# Patient Record
Sex: Female | Born: 1980 | Race: Black or African American | Hispanic: No | Marital: Single | Smoking: Former smoker
Health system: Southern US, Community
[De-identification: ages and names within clinical notes are randomized; demographics above are authoritative.]

## PROBLEM LIST (undated history)

## (undated) ENCOUNTER — Inpatient Hospital Stay (HOSPITAL_COMMUNITY): Payer: Self-pay

## (undated) DIAGNOSIS — F419 Anxiety disorder, unspecified: Secondary | ICD-10-CM

## (undated) DIAGNOSIS — D649 Anemia, unspecified: Secondary | ICD-10-CM

## (undated) DIAGNOSIS — D509 Iron deficiency anemia, unspecified: Secondary | ICD-10-CM

## (undated) DIAGNOSIS — E559 Vitamin D deficiency, unspecified: Secondary | ICD-10-CM

## (undated) DIAGNOSIS — O2340 Unspecified infection of urinary tract in pregnancy, unspecified trimester: Secondary | ICD-10-CM

## (undated) DIAGNOSIS — E785 Hyperlipidemia, unspecified: Secondary | ICD-10-CM

## (undated) DIAGNOSIS — I10 Essential (primary) hypertension: Secondary | ICD-10-CM

## (undated) DIAGNOSIS — A599 Trichomoniasis, unspecified: Secondary | ICD-10-CM

## (undated) HISTORY — DX: Hyperlipidemia, unspecified: E78.5

## (undated) HISTORY — DX: Anxiety disorder, unspecified: F41.9

## (undated) HISTORY — PX: WISDOM TOOTH EXTRACTION: SHX21

---
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
## 2005-04-25 ENCOUNTER — Emergency Department (HOSPITAL_COMMUNITY): Admission: EM | Admit: 2005-04-25 | Discharge: 2005-04-25 | Payer: Self-pay | Admitting: Family Medicine

## 2006-05-09 ENCOUNTER — Emergency Department (HOSPITAL_COMMUNITY): Admission: EM | Admit: 2006-05-09 | Discharge: 2006-05-09 | Payer: Self-pay | Admitting: Family Medicine

## 2010-11-15 ENCOUNTER — Inpatient Hospital Stay (INDEPENDENT_AMBULATORY_CARE_PROVIDER_SITE_OTHER)
Admission: RE | Admit: 2010-11-15 | Discharge: 2010-11-15 | Disposition: A | Discharge status: Home / Self Care | Payer: Medicaid Other | Source: Ambulatory Visit | Attending: Emergency Medicine | Admitting: Emergency Medicine

## 2010-11-15 DIAGNOSIS — R51 Headache: Secondary | ICD-10-CM

## 2010-11-15 DIAGNOSIS — N390 Urinary tract infection, site not specified: Secondary | ICD-10-CM

## 2010-11-15 LAB — POCT URINALYSIS DIP (DEVICE)
Bilirubin Urine: NEGATIVE
Glucose, UA: NEGATIVE mg/dL
Hgb urine dipstick: NEGATIVE
Ketones, ur: NEGATIVE mg/dL
Nitrite: NEGATIVE
Specific Gravity, Urine: 1.025 (ref 1.005–1.030)
pH: 6 (ref 5.0–8.0)

## 2011-06-14 NOTE — L&D Delivery Note (Signed)
Delivery Note At 8:07 AM a viable female was delivered via  (Presentation: ;  ).  APGAR: , ; weight .   Placenta status: , .  Cord:  with the following complications: .  Cord pH: not done  Anesthesia: Epidural  Episiotomy:  Lacerations:  Suture Repair: 2.0 Est. Blood Loss (mL):   Mom to postpartum.  Baby to nursery-stable.  Sherman Lipuma A 01/22/2012, 8:19 AM

## 2011-07-12 ENCOUNTER — Other Ambulatory Visit: Payer: Self-pay | Admitting: Internal Medicine

## 2011-07-12 DIAGNOSIS — R946 Abnormal results of thyroid function studies: Secondary | ICD-10-CM

## 2011-07-14 ENCOUNTER — Ambulatory Visit
Admission: RE | Admit: 2011-07-14 | Discharge: 2011-07-14 | Disposition: A | Discharge status: Home / Self Care | Payer: Medicaid Other | Source: Ambulatory Visit | Attending: Internal Medicine | Admitting: Internal Medicine

## 2011-07-14 DIAGNOSIS — R946 Abnormal results of thyroid function studies: Secondary | ICD-10-CM

## 2011-09-27 ENCOUNTER — Encounter (HOSPITAL_COMMUNITY): Payer: Self-pay

## 2011-09-27 ENCOUNTER — Inpatient Hospital Stay (HOSPITAL_COMMUNITY)
Admission: AD | Admit: 2011-09-27 | Discharge: 2011-09-27 | Disposition: A | Discharge status: Home / Self Care | Payer: Medicaid Other | Source: Ambulatory Visit | Attending: Obstetrics | Admitting: Obstetrics

## 2011-09-27 ENCOUNTER — Inpatient Hospital Stay (HOSPITAL_COMMUNITY): Payer: Medicaid Other

## 2011-09-27 DIAGNOSIS — Z3490 Encounter for supervision of normal pregnancy, unspecified, unspecified trimester: Secondary | ICD-10-CM

## 2011-09-27 DIAGNOSIS — A5901 Trichomonal vulvovaginitis: Secondary | ICD-10-CM | POA: Insufficient documentation

## 2011-09-27 DIAGNOSIS — O98819 Other maternal infectious and parasitic diseases complicating pregnancy, unspecified trimester: Secondary | ICD-10-CM | POA: Insufficient documentation

## 2011-09-27 DIAGNOSIS — M545 Low back pain, unspecified: Secondary | ICD-10-CM | POA: Insufficient documentation

## 2011-09-27 DIAGNOSIS — A599 Trichomoniasis, unspecified: Secondary | ICD-10-CM

## 2011-09-27 DIAGNOSIS — Z1389 Encounter for screening for other disorder: Secondary | ICD-10-CM

## 2011-09-27 DIAGNOSIS — M79609 Pain in unspecified limb: Secondary | ICD-10-CM | POA: Insufficient documentation

## 2011-09-27 HISTORY — DX: Essential (primary) hypertension: I10

## 2011-09-27 LAB — URINALYSIS, ROUTINE W REFLEX MICROSCOPIC
Bilirubin Urine: NEGATIVE
Glucose, UA: NEGATIVE mg/dL
Hgb urine dipstick: NEGATIVE
Specific Gravity, Urine: 1.01 (ref 1.005–1.030)
Urobilinogen, UA: 0.2 mg/dL (ref 0.0–1.0)
pH: 7 (ref 5.0–8.0)

## 2011-09-27 LAB — URINE MICROSCOPIC-ADD ON

## 2011-09-27 LAB — WET PREP, GENITAL
Clue Cells Wet Prep HPF POC: NONE SEEN
Yeast Wet Prep HPF POC: NONE SEEN

## 2011-09-27 MED ORDER — METRONIDAZOLE 500 MG PO TABS: 500.0000 mg | ORAL_TABLET | Freq: Two times a day (BID) | ORAL | Status: AC

## 2011-09-27 NOTE — MAU Provider Note (Signed)
History   Pt presents today c/o low back pain and occ leg pain. She states she thinks she is about 14wks preg but is not sure. She also c/o a heavy white vag dc. She denies vag bleeding, severe back pain, fever, or any other sx at this time. She states she can feel fetal movement.  CSN: [id number]  Arrival date and time: 09/27/11 4098   First Provider Initiated Contact with Patient 09/27/11 1036      Chief Complaint  Patient presents with  . Vaginal Discharge   HPI  OB History    Grav Para Term Preterm Abortions TAB SAB Ect Mult Living   2 1 1  0 0 0 0 0 0 1      Past Medical History  Diagnosis Date  . Hypertension     Past Surgical History  Procedure Date  . No past surgeries     Family History  Problem Relation Age of Onset  . Anesthesia problems Neg Hx     History  Substance Use Topics  . Smoking status: Never Smoker   . Smokeless tobacco: Not on file  . Alcohol Use: No    Allergies: No Known Allergies  Prescriptions prior to admission  Medication Sig Dispense Refill  . Prenatal Vit-Fe Fumarate-FA (PRENATAL MULTIVITAMIN) TABS Take 1 tablet by mouth every morning.        Review of Systems  Constitutional: Negative for fever and chills.  Eyes: Negative for blurred vision and double vision.  Respiratory: Negative for cough, hemoptysis, sputum production, shortness of breath and wheezing.   Cardiovascular: Negative for chest pain and palpitations.  Gastrointestinal: Positive for abdominal pain. Negative for nausea, vomiting, diarrhea and constipation.  Genitourinary: Negative for dysuria, urgency, frequency and hematuria.  Neurological: Negative for dizziness and headaches.  Psychiatric/Behavioral: Negative for depression and suicidal ideas.   Physical Exam   Blood pressure 125/63, pulse 91, temperature 97.3 F (36.3 C), temperature source Oral, resp. rate 16, height 5\' 2"  (1.575 m), weight 146 lb 3.2 oz (66.316 kg), SpO2 100.00%.  Physical Exam    Nursing note and vitals reviewed. Constitutional: She is oriented to person, place, and time. She appears well-developed and well-nourished. No distress.  HENT:  Head: Normocephalic and atraumatic.  Eyes: EOM are normal. Pupils are equal, round, and reactive to light.  GI: Soft. She exhibits no distension and no mass. There is no tenderness. There is no rebound and no guarding.  Genitourinary: No bleeding around the vagina. Vaginal discharge found.       Copious amount of frothy, green vag dc present. Cervix is friable. Cervix Lg/closed.  Neurological: She is alert and oriented to person, place, and time.  Skin: Skin is warm and dry. She is not diaphoretic.  Psychiatric: She has a normal mood and affect. Her behavior is normal. Judgment and thought content normal.    MAU Course  Procedures  Wet prep and GC/Chlamydia cultures done.  Results for orders placed during the hospital encounter of 09/27/11 (from the past 24 hour(s))  URINALYSIS, ROUTINE W REFLEX MICROSCOPIC     Status: Abnormal   Collection Time   09/27/11 10:00 AM      Component Value Range   Color, Urine YELLOW  YELLOW    APPearance CLEAR  CLEAR    Specific Gravity, Urine 1.010  1.005 - 1.030    pH 7.0  5.0 - 8.0    Glucose, UA NEGATIVE  NEGATIVE (mg/dL)   Hgb urine dipstick NEGATIVE  NEGATIVE  Bilirubin Urine NEGATIVE  NEGATIVE    Ketones, ur NEGATIVE  NEGATIVE (mg/dL)   Protein, ur NEGATIVE  NEGATIVE (mg/dL)   Urobilinogen, UA 0.2  0.0 - 1.0 (mg/dL)   Nitrite NEGATIVE  NEGATIVE    Leukocytes, UA SMALL (*) NEGATIVE   URINE MICROSCOPIC-ADD ON     Status: Abnormal   Collection Time   09/27/11 10:00 AM      Component Value Range   Squamous Epithelial / LPF FEW (*) RARE    WBC, UA 0-2  <3 (WBC/hpf)   RBC / HPF 0-2  <3 (RBC/hpf)   Urine-Other TRICHOMONAS PRESENT    WET PREP, GENITAL     Status: Abnormal   Collection Time   09/27/11 11:00 AM      Component Value Range   Yeast Wet Prep HPF POC NONE SEEN  NONE SEEN     Trich, Wet Prep FEW (*) NONE SEEN    Clue Cells Wet Prep HPF POC NONE SEEN  NONE SEEN    WBC, Wet Prep HPF POC MODERATE (*) NONE SEEN    US shows single IUP at 21.5wks with an EDC of 02/02/12.   Assessment and Plan  Trichomoniasis: discussed with pt at length. Will tx with Flagyl. Warned of antabuse reaction. Discussed safe sex practices.  Preg: discussed need for prenatal care. Discussed diet, activity, risks, and precautions. She states she will make an appt with Dr. Gaynell Face ASAP.  Clinton Gallant. Ovadia Lopp III, DrHSc, MPAS, PA-C  09/27/2011, 10:41 AM

## 2011-09-27 NOTE — MAU Note (Signed)
Pt in c/o lower back pain since Friday and occasional pains shooting down left leg.  Reports a creamy, white discharge that started this morning.  Denies any abdominal pain or vaginal bleeding.  LMP 06/28/11.

## 2011-09-27 NOTE — Discharge Instructions (Signed)
Trichomoniasis  Trichomoniasis is an infection, caused by the Trichomonas organism, that affects both women and men. In women, the outer female genitalia and the vagina are affected. In men, the penis is mainly affected, but the prostate and other reproductive organs can also be involved. Trichomoniasis is a sexually transmitted disease (STD) and is most often passed to another person through sexual contact. The majority of people who get trichomoniasis do so from a sexual encounter and are also at risk for other STDs.  CAUSES    Sexual intercourse with an infected partner.   It can be present in swimming pools or hot tubs.  SYMPTOMS    Abnormal gray-green frothy vaginal discharge in women.   Vaginal itching and irritation in women.   Itching and irritation of the area outside the vagina in women.   Penile discharge with or without pain in males.   Inflammation of the urethra (urethritis), causing painful urination.   Bleeding after sexual intercourse.  RELATED COMPLICATIONS   Pelvic inflammatory disease.   Infection of the uterus (endometritis).   Infertility.   Tubal (ectopic) pregnancy.   It can be associated with other STDs, including gonorrhea and chlamydia, hepatitis B, and HIV.  COMPLICATIONS DURING PREGNANCY   Early (premature) delivery.   Premature rupture of the membranes (PROM).   Low birth weight.  DIAGNOSIS    Visualization of Trichomonas under the microscope from the vagina discharge.   Ph of the vagina greater than 4.5, tested with a test tape.   Trich Rapid Test.   Culture of the organism, but this is not usually needed.   It may be found on a Pap test.   Having a "strawberry cervix,"which means the cervix looks very red like a strawberry.  TREATMENT    You may be given medication to fight the infection. Inform your caregiver if you could be or are pregnant. Some medications used to treat the infection should not be taken during pregnancy.   Over-the-counter medications or  creams to decrease itching or irritation may be recommended.   Your sexual partner will need to be treated if infected.  HOME CARE INSTRUCTIONS    Take all medication prescribed by your caregiver.   Take over-the-counter medication for itching or irritation as directed by your caregiver.   Do not have sexual intercourse while you have the infection.   Do not douche or wear tampons.   Discuss your infection with your partner, as your partner may have acquired the infection from you. Or, your partner may have been the person who transmitted the infection to you.   Have your sex partner examined and treated if necessary.   Practice safe, informed, and protected sex.   See your caregiver for other STD testing.  SEEK MEDICAL CARE IF:    You still have symptoms after you finish the medication.   You have an oral temperature above 102 F (38.9 C).   You develop belly (abdominal) pain.   You have pain when you urinate.   You have bleeding after sexual intercourse.   You develop a rash.   The medication makes you sick or makes you throw up (vomit).  Document Released: 11/23/2000 Document Revised: 05/19/2011 Document Reviewed: 12/19/2008  [employer] Patient Information 2012 [employer].

## 2011-09-27 NOTE — MAU Note (Signed)
Patient states she has had no prenatal care, plans to go to Dr. Gaynell Face. Has had a white creamy vaginal discharge since last night. Denies any bleeding or pain.

## 2011-09-28 LAB — GC/CHLAMYDIA PROBE AMP, GENITAL: GC Probe Amp, Genital: NEGATIVE

## 2011-10-21 LAB — OB RESULTS CONSOLE HEPATITIS B SURFACE ANTIGEN: Hepatitis B Surface Ag: NEGATIVE

## 2011-10-21 LAB — OB RESULTS CONSOLE RUBELLA ANTIBODY, IGM: Rubella: IMMUNE

## 2011-10-21 LAB — OB RESULTS CONSOLE ANTIBODY SCREEN: Antibody Screen: NEGATIVE

## 2011-10-21 LAB — OB RESULTS CONSOLE HIV ANTIBODY (ROUTINE TESTING): HIV: NONREACTIVE

## 2011-11-16 ENCOUNTER — Inpatient Hospital Stay (HOSPITAL_COMMUNITY)
Admission: AD | Admit: 2011-11-16 | Discharge: 2011-11-16 | Disposition: A | Discharge status: Home / Self Care | Payer: Medicaid Other | Source: Ambulatory Visit | Attending: Obstetrics | Admitting: Obstetrics

## 2011-11-16 ENCOUNTER — Encounter (HOSPITAL_COMMUNITY): Payer: Self-pay | Admitting: *Deleted

## 2011-11-16 DIAGNOSIS — B9689 Other specified bacterial agents as the cause of diseases classified elsewhere: Secondary | ICD-10-CM | POA: Insufficient documentation

## 2011-11-16 DIAGNOSIS — R319 Hematuria, unspecified: Secondary | ICD-10-CM | POA: Insufficient documentation

## 2011-11-16 DIAGNOSIS — A499 Bacterial infection, unspecified: Secondary | ICD-10-CM

## 2011-11-16 DIAGNOSIS — O2340 Unspecified infection of urinary tract in pregnancy, unspecified trimester: Secondary | ICD-10-CM

## 2011-11-16 DIAGNOSIS — N390 Urinary tract infection, site not specified: Secondary | ICD-10-CM | POA: Insufficient documentation

## 2011-11-16 DIAGNOSIS — O2390 Unspecified genitourinary tract infection in pregnancy, unspecified trimester: Secondary | ICD-10-CM | POA: Insufficient documentation

## 2011-11-16 DIAGNOSIS — N72 Inflammatory disease of cervix uteri: Secondary | ICD-10-CM | POA: Insufficient documentation

## 2011-11-16 DIAGNOSIS — N760 Acute vaginitis: Secondary | ICD-10-CM | POA: Insufficient documentation

## 2011-11-16 LAB — DIFFERENTIAL
Basophils Absolute: 0 10*3/uL (ref 0.0–0.1)
Basophils Relative: 0 % (ref 0–1)
Eosinophils Absolute: 0.2 10*3/uL (ref 0.0–0.7)
Eosinophils Relative: 2 % (ref 0–5)

## 2011-11-16 LAB — URINALYSIS, ROUTINE W REFLEX MICROSCOPIC
Nitrite: NEGATIVE
Specific Gravity, Urine: 1.01 (ref 1.005–1.030)
Urobilinogen, UA: 0.2 mg/dL (ref 0.0–1.0)
pH: 7 (ref 5.0–8.0)

## 2011-11-16 LAB — CBC
MCH: 31.1 pg (ref 26.0–34.0)
MCV: 88 fL (ref 78.0–100.0)
Platelets: 272 10*3/uL (ref 150–400)
RDW: 13.6 % (ref 11.5–15.5)

## 2011-11-16 LAB — URINE MICROSCOPIC-ADD ON

## 2011-11-16 LAB — WET PREP, GENITAL: Yeast Wet Prep HPF POC: NONE SEEN

## 2011-11-16 LAB — COMPREHENSIVE METABOLIC PANEL
Alkaline Phosphatase: 65 U/L (ref 39–117)
BUN: 6 mg/dL (ref 6–23)
Creatinine, Ser: 0.56 mg/dL (ref 0.50–1.10)
GFR calc non Af Amer: >90 mL/min (ref >90)
Sodium: 135 mEq/L (ref 135–145)
Total Bilirubin: 0.2 mg/dL — ABNORMAL LOW (ref 0.3–1.2)

## 2011-11-16 MED ORDER — CEFTRIAXONE SODIUM 250 MG IJ SOLR
250.0000 mg | Freq: Once | INTRAMUSCULAR | Status: AC
  Administered 2011-11-16: 250 mg via INTRAMUSCULAR
  Filled 2011-11-16: qty 250

## 2011-11-16 MED ORDER — AZITHROMYCIN 1 G PO PACK
1.0000 g | PACK | Freq: Once | ORAL | Status: AC
  Administered 2011-11-16: 1 g via ORAL
  Filled 2011-11-16: qty 1

## 2011-11-16 MED ORDER — METRONIDAZOLE 500 MG PO TABS: 500.0000 mg | ORAL_TABLET | Freq: Two times a day (BID) | ORAL | Status: AC

## 2011-11-16 NOTE — MAU Provider Note (Signed)
History     CSN: [id number]  Arrival date & time 11/16/11  2016   None     Chief Complaint  Patient presents with  . Hematuria    HPI Andrea Stone is a 31 y.o. female @ [redacted]w[redacted]d gestation who presents to MAU for ?  blood in urine or vagina. Symptoms started 4 days ago. Trying to drink more water and rest but continues to notice the blood. Has frequency but denies dysuria. Has noticed an increase in vaginal discharge. The history was provided by the patient.  Past Medical History  Diagnosis Date  . Hypertension     Past Surgical History  Procedure Date  . No past surgeries     Family History  Problem Relation Age of Onset  . Anesthesia problems Neg Hx     History  Substance Use Topics  . Smoking status: Never Smoker   . Smokeless tobacco: Not on file  . Alcohol Use: No    OB History    Grav Para Term Preterm Abortions TAB SAB Ect Mult Living   2 1 1  0 0 0 0 0 0 1      Review of Systems  Constitutional: Negative for fever, chills, diaphoresis and fatigue.  HENT: Negative for ear pain, congestion, sore throat, facial swelling, neck pain, neck stiffness, dental problem and sinus pressure.   Eyes: Negative for photophobia, pain, discharge and visual disturbance.  Respiratory: Negative for cough, chest tightness and wheezing.   Cardiovascular: Negative for chest pain, palpitations and leg swelling.  Gastrointestinal: Positive for abdominal pain. Negative for nausea, vomiting, diarrhea, constipation and abdominal distention.  Genitourinary: Positive for frequency, hematuria, vaginal bleeding, vaginal discharge and pelvic pain. Negative for dysuria, flank pain and difficulty urinating.       Pressure with urination.  Musculoskeletal: Negative for myalgias, back pain and gait problem.  Skin: Negative for color change and rash.  Neurological: Negative for dizziness, speech difficulty, weakness, light-headedness, numbness and headaches.  Psychiatric/Behavioral: Negative for  confusion and agitation. The patient is not nervous/anxious.     Allergies  Review of patient's allergies indicates no known allergies.  Home Medications  No current outpatient prescriptions on file.  BP 101/55  Pulse 89  Temp(Src) 98.9 F (37.2 C) (Oral)  Resp 16  Ht 5\' 4"  (1.626 m)  Wt 145 lb 12.8 oz (66.134 kg)  BMI 25.03 kg/m2  Physical Exam  Nursing note and vitals reviewed. Constitutional: She is oriented to person, place, and time. She appears well-developed and well-nourished. No distress.  HENT:  Head: Normocephalic.  Eyes: EOM are normal.  Neck: Neck supple.  Cardiovascular: Normal rate.   Pulmonary/Chest: Effort normal.  Abdominal: Soft. There is tenderness in the right lower quadrant and left lower quadrant. There is no rigidity, no rebound, no guarding and no CVA tenderness.  Genitourinary:       External genitalia without lesions. Large amount of yellow frothy bloody discharge vaginal vault. Cervix friable, bleeding does not appear to be coming from the os. External os 1 cm. Internal os closed, thick, high. Uterus consistent with dates.  Musculoskeletal: Normal range of motion.  Neurological: She is alert and oriented to person, place, and time. No cranial nerve deficit.  Skin: Skin is warm and dry.  Psychiatric: She has a normal mood and affect. Her behavior is normal. Judgment and thought content normal.   Results for orders placed during the hospital encounter of 11/16/11 (from the past 24 hour(s))  URINALYSIS, ROUTINE W REFLEX MICROSCOPIC  Status: Abnormal   Collection Time   11/16/11  8:30 PM      Component Value Range   Color, Urine YELLOW  YELLOW    APPearance HAZY (*) CLEAR    Specific Gravity, Urine 1.010  1.005 - 1.030    pH 7.0  5.0 - 8.0    Glucose, UA NEGATIVE  NEGATIVE (mg/dL)   Hgb urine dipstick MODERATE (*) NEGATIVE    Bilirubin Urine NEGATIVE  NEGATIVE    Ketones, ur NEGATIVE  NEGATIVE (mg/dL)   Protein, ur NEGATIVE  NEGATIVE (mg/dL)    Urobilinogen, UA 0.2  0.0 - 1.0 (mg/dL)   Nitrite NEGATIVE  NEGATIVE    Leukocytes, UA LARGE (*) NEGATIVE   URINE MICROSCOPIC-ADD ON     Status: Abnormal   Collection Time   11/16/11  8:30 PM      Component Value Range   Squamous Epithelial / LPF FEW (*) RARE    WBC, UA 7-10  <3 (WBC/hpf)   RBC / HPF 0-2  <3 (RBC/hpf)   Urine-Other MUCOUS PRESENT    CBC     Status: Abnormal   Collection Time   11/16/11  9:35 PM      Component Value Range   WBC 11.7 (*) 4.0 - 10.5 (K/uL)   RBC 3.67 (*) 3.87 - 5.11 (MIL/uL)   Hemoglobin 11.4 (*) 12.0 - 15.0 (g/dL)   HCT 95.2 (*) 84.1 - 46.0 (%)   MCV 88.0  78.0 - 100.0 (fL)   MCH 31.1  26.0 - 34.0 (pg)   MCHC 35.3  30.0 - 36.0 (g/dL)   RDW 32.4  40.1 - 02.7 (%)   Platelets 272  150 - 400 (K/uL)  DIFFERENTIAL     Status: Abnormal   Collection Time   11/16/11  9:35 PM      Component Value Range   Neutrophils Relative 56  43 - 77 (%)   Neutro Abs 6.6  1.7 - 7.7 (K/uL)   Lymphocytes Relative 33  12 - 46 (%)   Lymphs Abs 3.9  0.7 - 4.0 (K/uL)   Monocytes Relative 9  3 - 12 (%)   Monocytes Absolute 1.1 (*) 0.1 - 1.0 (K/uL)   Eosinophils Relative 2  0 - 5 (%)   Eosinophils Absolute 0.2  0.0 - 0.7 (K/uL)   Basophils Relative 0  0 - 1 (%)   Basophils Absolute 0.0  0.0 - 0.1 (K/uL)  COMPREHENSIVE METABOLIC PANEL     Status: Abnormal   Collection Time   11/16/11  9:35 PM      Component Value Range   Sodium 135  135 - 145 (mEq/L)   Potassium 3.9  3.5 - 5.1 (mEq/L)   Chloride 101  96 - 112 (mEq/L)   CO2 27  19 - 32 (mEq/L)   Glucose, Bld 83  70 - 99 (mg/dL)   BUN 6  6 - 23 (mg/dL)   Creatinine, Ser 2.53  0.50 - 1.10 (mg/dL)   Calcium 9.1  8.4 - 66.4 (mg/dL)   Total Protein 6.5  6.0 - 8.3 (g/dL)   Albumin 2.9 (*) 3.5 - 5.2 (g/dL)   AST 17  0 - 37 (U/L)   ALT 12  0 - 35 (U/L)   Alkaline Phosphatase 65  39 - 117 (U/L)   Total Bilirubin 0.2 (*) 0.3 - 1.2 (mg/dL)   GFR calc non Af Amer >90  >90 (mL/min)   GFR calc Af Amer >90  >90 (mL/min)  WET PREP,  GENITAL     Status: Abnormal   Collection Time   11/16/11 10:08 PM      Component Value Range   Yeast Wet Prep HPF POC NONE SEEN  NONE SEEN    Trich, Wet Prep NONE SEEN  NONE SEEN    Clue Cells Wet Prep HPF POC MANY (*) NONE SEEN    WBC, Wet Prep HPF POC TOO NUMEROUS TO COUNT (*) NONE SEEN     EFM: Baseline FH 130, reactive.  Assessment: Bacterial vaginosis   UTI @ [redacted]w[redacted]d gestation   Cervicitis  Plan:  Zithromax 1 gram po   Flagyl Rx   Rocephin 250 mg IM   GC, Chlamydia, Urine cultures pending   D/C home to follow up in the office.  ED Course: discussed with Dr. Shana Chute  Procedures  MDM

## 2011-11-16 NOTE — MAU Note (Signed)
Patient presents with c/o blood in urine since Saturday, pain in left lower quadrant pt is [redacted] weeks gestation.

## 2011-11-16 NOTE — Discharge Instructions (Signed)
Bacterial Vaginosis Bacterial vaginosis (BV) is a vaginal infection where the normal balance of bacteria in the vagina is disrupted. The normal balance is then replaced by an overgrowth of certain bacteria. There are several different kinds of bacteria that can cause BV. BV is the most common vaginal infection in women of childbearing age. CAUSES   The cause of BV is not fully understood. BV develops when there is an increase or imbalance of harmful bacteria.   Some activities or behaviors can upset the normal balance of bacteria in the vagina and put women at increased risk including:   Having a new sex partner or multiple sex partners.   Douching.   Using an intrauterine device (IUD) for contraception.   It is not clear what role sexual activity plays in the development of BV. However, women that have never had sexual intercourse are rarely infected with BV.  Women do not get BV from toilet seats, bedding, swimming pools or from touching objects around them.  SYMPTOMS   Grey vaginal discharge.   A fish-like odor with discharge, especially after sexual intercourse.   Itching or burning of the vagina and vulva.   Burning or pain with urination.   Some women have no signs or symptoms at all.  DIAGNOSIS  Your caregiver must examine the vagina for signs of BV. Your caregiver will perform lab tests and look at the sample of vaginal fluid through a microscope. They will look for bacteria and abnormal cells (clue cells), a pH test higher than 4.5, and a positive amine test all associated with BV.  RISKS AND COMPLICATIONS   Pelvic inflammatory disease (PID).   Infections following gynecology surgery.   Developing HIV.   Developing herpes virus.  TREATMENT  Sometimes BV will clear up without treatment. However, all women with symptoms of BV should be treated to avoid complications, especially if gynecology surgery is planned. Female partners generally do not need to be treated. However,  BV may spread between female sex partners so treatment is helpful in preventing a recurrence of BV.   BV may be treated with antibiotics. The antibiotics come in either pill or vaginal cream forms. Either can be used with nonpregnant or pregnant women, but the recommended dosages differ. These antibiotics are not harmful to the baby.   BV can recur after treatment. If this happens, a second round of antibiotics will often be prescribed.   Treatment is important for pregnant women. If not treated, BV can cause a premature delivery, especially for a pregnant woman who had a premature birth in the past. All pregnant women who have symptoms of BV should be checked and treated.   For chronic reoccurrence of BV, treatment with a type of prescribed gel vaginally twice a week is helpful.  HOME CARE INSTRUCTIONS   Finish all medication as directed by your caregiver.   Do not have sex until treatment is completed.   Tell your sexual partner that you have a vaginal infection. They should see their caregiver and be treated if they have problems, such as a mild rash or itching.   Practice safe sex. Use condoms. Only have 1 sex partner.  PREVENTION  Basic prevention steps can help reduce the risk of upsetting the natural balance of bacteria in the vagina and developing BV:  Do not have sexual intercourse (be abstinent).   Do not douche.   Use all of the medicine prescribed for treatment of BV, even if the signs and symptoms go away.     Tell your sex partner if you have BV. That way, they can be treated, if needed, to prevent reoccurrence.  SEEK MEDICAL CARE IF:   Your symptoms are not improving after 3 days of treatment.   You have increased discharge, pain, or fever.  MAKE SURE YOU:   Understand these instructions.   Will watch your condition.   Will get help right away if you are not doing well or get worse.  FOR MORE INFORMATION  Division of STD Prevention (DSTDP), Centers for Disease  Control and Prevention: [employer] American [employer] Health Association (ASHA): www.ashastd.org  Document Released: 05/30/2005 Document Revised: 05/19/2011 Document Reviewed: 11/20/2008 [facility] Patient Information 2012 [place], [place].Cervicitis Cervicitis is a soreness and swelling (inflammation) of the cervix. Your cervix is located at the bottom of your uterus which opens up to the vagina.  CAUSES   Sexually transmitted infections (STIs).   Allergic reaction.   Medicines or birth control devices that are put in the vagina.   Injury to the cervix.   Bacterial infections.  SYMPTOMS  There may be no symptoms. If symptoms occur, they may include:  Grey, white, yellow, or bad smelling vaginal discharge.   Pain or itching of the area outside the vagina.   Painful sexual intercourse.   Lower abdominal or lower back pain, especially during intercourse.   Frequent urination.   Abnormal vaginal bleeding between periods, after sexual intercourse, or after menopause.   Pressure or a heavy feeling in the pelvis.  DIAGNOSIS  Diagnosis is made after a pelvic exam. Other tests may include:  Examination of any discharge under a microscope (wet prep).   A Pap test.  TREATMENT  Treatment will depend on the cause of cervicitis. If it is caused by an STI, both you and your partner will need to be treated. Antibiotic medicines will be given. HOME CARE INSTRUCTIONS   Do not have sexual intercourse until your caregiver says it is okay.   Do not have sexual intercourse until your partner has been treated if your cervicitis is caused by an STI.   Take your antibiotics as directed. Finish them even if you start to feel better.  SEEK IMMEDIATE MEDICAL CARE IF:   Your symptoms come back.   You have a fever.   You experience any problems that may be related to the medicine you are taking.  MAKE SURE YOU:   Understand these instructions.   Will watch your condition.   Will get  help right away if you are not doing well or get worse.  Document Released: 05/30/2005 Document Revised: 05/19/2011 Document Reviewed: 12/27/2010 [facility] Patient Information 2012 [place], [place].Cervicitis Cervicitis is a soreness and swelling (inflammation) of the cervix. Your cervix is located at the bottom of your uterus which opens up to the vagina.  CAUSES   Sexually transmitted infections (STIs).   Allergic reaction.   Medicines or birth control devices that are put in the vagina.   Injury to the cervix.   Bacterial infections.  SYMPTOMS  There may be no symptoms. If symptoms occur, they may include:  Grey, white, yellow, or bad smelling vaginal discharge.   Pain or itching of the area outside the vagina.   Painful sexual intercourse.   Lower abdominal or lower back pain, especially during intercourse.   Frequent urination.   Abnormal vaginal bleeding between periods, after sexual intercourse, or after menopause.   Pressure or a heavy feeling in the pelvis.  DIAGNOSIS  Diagnosis is made after a pelvic exam.  Other tests may include:  Examination of any discharge under a microscope (wet prep).   A Pap test.  TREATMENT  Treatment will depend on the cause of cervicitis. If it is caused by an STI, both you and your partner will need to be treated. Antibiotic medicines will be given. HOME CARE INSTRUCTIONS   Do not have sexual intercourse until your caregiver says it is okay.   Do not have sexual intercourse until your partner has been treated if your cervicitis is caused by an STI.   Take your antibiotics as directed. Finish them even if you start to feel better.  SEEK IMMEDIATE MEDICAL CARE IF:   Your symptoms come back.   You have a fever.   You experience any problems that may be related to the medicine you are taking.  MAKE SURE YOU:   Understand these instructions.   Will watch your condition.   Will get help right away if you are not doing well or  get worse.  Document Released: 05/30/2005 Document Revised: 05/19/2011 Document Reviewed: 12/27/2010 [facility] Patient Information 2012 [place], [place].

## 2011-11-17 LAB — GC/CHLAMYDIA PROBE AMP, GENITAL
Chlamydia, DNA Probe: NEGATIVE
GC Probe Amp, Genital: NEGATIVE

## 2011-11-19 LAB — URINE CULTURE: Colony Count: >=100000

## 2011-12-06 ENCOUNTER — Inpatient Hospital Stay (HOSPITAL_COMMUNITY)
Admission: AD | Admit: 2011-12-06 | Discharge: 2011-12-06 | Disposition: A | Discharge status: Home / Self Care | Payer: Medicaid Other | Source: Ambulatory Visit | Attending: Obstetrics | Admitting: Obstetrics

## 2011-12-06 ENCOUNTER — Encounter (HOSPITAL_COMMUNITY): Payer: Self-pay

## 2011-12-06 DIAGNOSIS — R109 Unspecified abdominal pain: Secondary | ICD-10-CM | POA: Insufficient documentation

## 2011-12-06 DIAGNOSIS — O2390 Unspecified genitourinary tract infection in pregnancy, unspecified trimester: Secondary | ICD-10-CM | POA: Insufficient documentation

## 2011-12-06 DIAGNOSIS — A499 Bacterial infection, unspecified: Secondary | ICD-10-CM | POA: Insufficient documentation

## 2011-12-06 DIAGNOSIS — O2340 Unspecified infection of urinary tract in pregnancy, unspecified trimester: Secondary | ICD-10-CM

## 2011-12-06 DIAGNOSIS — N390 Urinary tract infection, site not specified: Secondary | ICD-10-CM | POA: Insufficient documentation

## 2011-12-06 DIAGNOSIS — N949 Unspecified condition associated with female genital organs and menstrual cycle: Secondary | ICD-10-CM | POA: Insufficient documentation

## 2011-12-06 DIAGNOSIS — B9689 Other specified bacterial agents as the cause of diseases classified elsewhere: Secondary | ICD-10-CM | POA: Insufficient documentation

## 2011-12-06 DIAGNOSIS — N760 Acute vaginitis: Secondary | ICD-10-CM | POA: Insufficient documentation

## 2011-12-06 LAB — URINALYSIS, ROUTINE W REFLEX MICROSCOPIC
Bilirubin Urine: NEGATIVE
Hgb urine dipstick: NEGATIVE
Ketones, ur: NEGATIVE mg/dL
Nitrite: NEGATIVE
Specific Gravity, Urine: 1.01 (ref 1.005–1.030)
Urobilinogen, UA: 0.2 mg/dL (ref 0.0–1.0)
pH: 6.5 (ref 5.0–8.0)

## 2011-12-06 LAB — WET PREP, GENITAL
Trich, Wet Prep: NONE SEEN
Yeast Wet Prep HPF POC: NONE SEEN

## 2011-12-06 LAB — URINE MICROSCOPIC-ADD ON

## 2011-12-06 MED ORDER — NITROFURANTOIN MONOHYD MACRO 100 MG PO CAPS: 100.0000 mg | ORAL_CAPSULE | Freq: Two times a day (BID) | ORAL | Status: AC

## 2011-12-06 MED ORDER — METRONIDAZOLE 500 MG PO TABS: 500.0000 mg | ORAL_TABLET | Freq: Two times a day (BID) | ORAL | Status: AC

## 2011-12-06 NOTE — MAU Provider Note (Signed)
History     CSN: [id number]  Arrival date & time 12/06/11  0910   None     Chief Complaint  Patient presents with  . Vaginal discharge     HPI Andrea Stone is a 31 y.o. female @ [redacted]w[redacted]d gestation who presents to MAU for vaginal discharge and lower abdominal cramping.  Past Medical History  Diagnosis Date  . Hypertension     Past Surgical History  Procedure Date  . No past surgeries     Family History  Problem Relation Age of Onset  . Anesthesia problems Neg Hx   . Other Neg Hx     History  Substance Use Topics  . Smoking status: Never Smoker   . Smokeless tobacco: Never Used  . Alcohol Use: No    OB History    Grav Para Term Preterm Abortions TAB SAB Ect Mult Living   2 1 1  0 0 0 0 0 0 1      Review of Systems: as stated in HPI  Allergies  Review of patient's allergies indicates no known allergies.  Home Medications  No current outpatient prescriptions on file.  BP 110/56  Pulse 90  Temp 98.7 F (37.1 C) (Oral)  Resp 16  Ht 5\' 3"  (1.6 m)  Wt 150 lb (68.04 kg)  BMI 26.57 kg/m2  Physical Exam  Nursing note and vitals reviewed. Constitutional: She is oriented to person, place, and time. She appears well-developed and well-nourished.  HENT:  Head: Normocephalic.  Eyes: EOM are normal.  Neck: Neck supple.  Cardiovascular: Normal rate.   Pulmonary/Chest: Effort normal.  Abdominal: Soft. There is no tenderness.  Genitourinary:       External genitalia without lesions. Yellow, discharge vaginal vault. Cervix 1 cm, soft, high. Uterus consistent with dates.   Musculoskeletal: Normal range of motion.  Neurological: She is alert and oriented to person, place, and time. No cranial nerve deficit.  Skin: Skin is warm and dry.  Psychiatric: She has a normal mood and affect. Her behavior is normal. Judgment and thought content normal.   Results for orders placed during the hospital encounter of 12/06/11 (from the past 24 hour(s))  URINALYSIS, ROUTINE W  REFLEX MICROSCOPIC     Status: Abnormal   Collection Time   12/06/11  9:34 AM      Component Value Range   Color, Urine YELLOW  YELLOW   APPearance CLEAR  CLEAR   Specific Gravity, Urine 1.010  1.005 - 1.030   pH 6.5  5.0 - 8.0   Glucose, UA NEGATIVE  NEGATIVE mg/dL   Hgb urine dipstick NEGATIVE  NEGATIVE   Bilirubin Urine NEGATIVE  NEGATIVE   Ketones, ur NEGATIVE  NEGATIVE mg/dL   Protein, ur NEGATIVE  NEGATIVE mg/dL   Urobilinogen, UA 0.2  0.0 - 1.0 mg/dL   Nitrite NEGATIVE  NEGATIVE   Leukocytes, UA MODERATE (*) NEGATIVE  URINE MICROSCOPIC-ADD ON     Status: Abnormal   Collection Time   12/06/11  9:34 AM      Component Value Range   Squamous Epithelial / LPF FEW (*) RARE   WBC, UA 3-6  <3 WBC/hpf   Bacteria, UA RARE  RARE  WET PREP, GENITAL     Status: Abnormal   Collection Time   12/06/11 10:37 AM      Component Value Range   Yeast Wet Prep HPF POC NONE SEEN  NONE SEEN   Trich, Wet Prep NONE SEEN  NONE SEEN   Clue  Cells Wet Prep HPF POC FEW (*) NONE SEEN   WBC, Wet Prep HPF POC TOO NUMEROUS TO COUNT (*) NONE SEEN   ED Course: Care turned over to Roney Marion, CNM @ 11:43 am  Procedures  FHR 130's, +accels, reactive Toco - irregular  MDM    UTI Bacterial Vaginosis  Plan: DC to home RX Macrobid, Flagyl Urine Culture GC/CT pending Follow-up with Dr. Gaynell Face  [facility]

## 2011-12-06 NOTE — Discharge Instructions (Signed)
Bacterial Vaginosis Bacterial vaginosis (BV) is a vaginal infection where the normal balance of bacteria in the vagina is disrupted. The normal balance is then replaced by an overgrowth of certain bacteria. There are several different kinds of bacteria that can cause BV. BV is the most common vaginal infection in women of childbearing age. CAUSES   The cause of BV is not fully understood. BV develops when there is an increase or imbalance of harmful bacteria.   Some activities or behaviors can upset the normal balance of bacteria in the vagina and put women at increased risk including:   Having a new sex partner or multiple sex partners.   Douching.   Using an intrauterine device (IUD) for contraception.   It is not clear what role sexual activity plays in the development of BV. However, women that have never had sexual intercourse are rarely infected with BV.  Women do not get BV from toilet seats, bedding, swimming pools or from touching objects around them.  SYMPTOMS   Grey vaginal discharge.   A fish-like odor with discharge, especially after sexual intercourse.   Itching or burning of the vagina and vulva.   Burning or pain with urination.   Some women have no signs or symptoms at all.  DIAGNOSIS  Your caregiver must examine the vagina for signs of BV. Your caregiver will perform lab tests and look at the sample of vaginal fluid through a microscope. They will look for bacteria and abnormal cells (clue cells), a pH test higher than 4.5, and a positive amine test all associated with BV.  RISKS AND COMPLICATIONS   Pelvic inflammatory disease (PID).   Infections following gynecology surgery.   Developing HIV.   Developing herpes virus.  TREATMENT  Sometimes BV will clear up without treatment. However, all women with symptoms of BV should be treated to avoid complications, especially if gynecology surgery is planned. Female partners generally do not need to be treated. However,  BV may spread between female sex partners so treatment is helpful in preventing a recurrence of BV.   BV may be treated with antibiotics. The antibiotics come in either pill or vaginal cream forms. Either can be used with nonpregnant or pregnant women, but the recommended dosages differ. These antibiotics are not harmful to the baby.   BV can recur after treatment. If this happens, a second round of antibiotics will often be prescribed.   Treatment is important for pregnant women. If not treated, BV can cause a premature delivery, especially for a pregnant woman who had a premature birth in the past. All pregnant women who have symptoms of BV should be checked and treated.   For chronic reoccurrence of BV, treatment with a type of prescribed gel vaginally twice a week is helpful.  HOME CARE INSTRUCTIONS   Finish all medication as directed by your caregiver.   Do not have sex until treatment is completed.   Tell your sexual partner that you have a vaginal infection. They should see their caregiver and be treated if they have problems, such as a mild rash or itching.   Practice safe sex. Use condoms. Only have 1 sex partner.  PREVENTION  Basic prevention steps can help reduce the risk of upsetting the natural balance of bacteria in the vagina and developing BV:  Do not have sexual intercourse (be abstinent).   Do not douche.   Use all of the medicine prescribed for treatment of BV, even if the signs and symptoms go away.     Tell your sex partner if you have BV. That way, they can be treated, if needed, to prevent reoccurrence.  SEEK MEDICAL CARE IF:   Your symptoms are not improving after 3 days of treatment.   You have increased discharge, pain, or fever.  MAKE SURE YOU:   Understand these instructions.   Will watch your condition.   Will get help right away if you are not doing well or get worse.  FOR MORE INFORMATION  Division of STD Prevention (DSTDP), Centers for Disease  Control and Prevention: www.cdc.gov/std American Social Health Association (ASHA): www.ashastd.org  Document Released: 05/30/2005 Document Revised: 05/19/2011 Document Reviewed: 11/20/2008 Exit[employer] Patient Information 2012 [employer]. 

## 2011-12-06 NOTE — MAU Note (Signed)
Pt states had white thick creamy vaginal discharge, was told it was probably yeast, did not go to MD office. Did not use OTC meds to treat. Here today with same c/o white thick non-odorous vaginal discharge. Denies bleeding, is having lower abd pain, rates 6/10.

## 2011-12-07 LAB — URINE CULTURE
Colony Count: 5000
Culture  Setup Time: 201306252203
Special Requests: NORMAL

## 2011-12-07 LAB — GC/CHLAMYDIA PROBE AMP, GENITAL: Chlamydia, DNA Probe: NEGATIVE

## 2011-12-18 ENCOUNTER — Encounter (HOSPITAL_COMMUNITY): Payer: Self-pay | Admitting: *Deleted

## 2011-12-18 ENCOUNTER — Inpatient Hospital Stay (HOSPITAL_COMMUNITY)
Admission: AD | Admit: 2011-12-18 | Discharge: 2011-12-18 | Disposition: A | Discharge status: Hospice | Payer: Medicaid Other | Source: Ambulatory Visit | Attending: Obstetrics | Admitting: Obstetrics

## 2011-12-18 DIAGNOSIS — T63441A Toxic effect of venom of bees, accidental (unintentional), initial encounter: Secondary | ICD-10-CM

## 2011-12-18 DIAGNOSIS — O99891 Other specified diseases and conditions complicating pregnancy: Secondary | ICD-10-CM | POA: Insufficient documentation

## 2011-12-18 DIAGNOSIS — T63461A Toxic effect of venom of wasps, accidental (unintentional), initial encounter: Secondary | ICD-10-CM

## 2011-12-18 DIAGNOSIS — Z331 Pregnant state, incidental: Secondary | ICD-10-CM

## 2011-12-18 DIAGNOSIS — T6391XA Toxic effect of contact with unspecified venomous animal, accidental (unintentional), initial encounter: Secondary | ICD-10-CM | POA: Insufficient documentation

## 2011-12-18 HISTORY — DX: Unspecified infection of urinary tract in pregnancy, unspecified trimester: O23.40

## 2011-12-18 NOTE — MAU Note (Signed)
Pt was tung by a bee at 1800 both in her mid abdomen and left hip.  She spoke with call a nurse and was instructed to come in to MAU.  No swelling or redness noted on either sting site.

## 2011-12-18 NOTE — MAU Note (Signed)
Pt reports she was stung by a bee x 2 at 6pm, now she says the areas are hurting (one on lower back and one on stomach). Denies allergy to stings. Denies difficulty breathing

## 2011-12-18 NOTE — MAU Provider Note (Signed)
  History     CSN: [id number]  Arrival date and time: 12/18/11 2015   First Provider Initiated Contact with Patient 12/18/11 2105      Chief Complaint  Patient presents with  . Insect Bite   HPI  31 year old G2 P1 001 at 33 weeks and presents the MAU with complaints of increasing that happened around 5:00pm.  She does not want on her abdomen and once on her left flank. There is slightly tender and pruritic. She denies symptoms of shortness of breath, chest pain, wheezing, throat swelling, or palpitations.   OB History    Grav Para Term Preterm Abortions TAB SAB Ect Mult Living   2 1 1  0 0 0 0 0 0 1      Past Medical History  Diagnosis Date  . Hypertension   . UTI (urinary tract infection) during pregnancy     Past Surgical History  Procedure Date  . No past surgeries     Family History  Problem Relation Age of Onset  . Anesthesia problems Neg Hx   . Other Neg Hx   . Hypertension Mother   . Asthma Sister   . Kidney disease Maternal Grandmother     History  Substance Use Topics  . Smoking status: Current Some Day Smoker -- 0.2 packs/day for 13 years    Types: Cigarettes  . Smokeless tobacco: Never Used  . Alcohol Use: No    Allergies: No Known Allergies  Prescriptions prior to admission  Medication Sig Dispense Refill  . Prenatal Vit-Fe Fumarate-FA (PRENATAL MULTIVITAMIN) TABS Take 1 tablet by mouth every morning.        ROS Physical Exam   Blood pressure 132/77, pulse 91, temperature 98.9 F (37.2 C), temperature source Oral, resp. rate 20, height 5\' 4"  (1.626 m), weight 69.854 kg (154 lb), SpO2 100.00%.  Physical Exam  Constitutional: She appears well-developed and well-nourished.  Respiratory: Effort normal. No respiratory distress. She has no wheezes. She has no rales. She exhibits no tenderness.  GI: Soft. She exhibits no distension and no mass. There is no tenderness. There is no rebound and no guarding.  Skin: Skin is warm. No rash noted. No  pallor.       5 mm area of erythema and tenderness on the left flank and on the left abdomen. No raised area.  Psychiatric: She has a normal mood and affect. Her behavior is normal. Judgment and thought content normal.    MAU Course  Procedures NST: Category 1 tracing with baseline of 132 accelerations seen. Due contractions seen on the patient did not feel these.  MDM  no evidence of anaphylaxis  Assessment and Plan   #1 bee sting  Recommended ice the patient 15 minutes on at least 15 minutes off to aid in discomfort and swelling. Patient returned and has symptoms of anaphylaxis.   Adonias Demore JEHIEL 12/18/2011, 9:10 PM

## 2011-12-20 ENCOUNTER — Inpatient Hospital Stay (HOSPITAL_COMMUNITY)
Admission: AD | Admit: 2011-12-20 | Discharge: 2011-12-20 | Disposition: A | Discharge status: Home / Self Care | Payer: Medicaid Other | Source: Ambulatory Visit | Attending: Obstetrics | Admitting: Obstetrics

## 2011-12-20 ENCOUNTER — Encounter (HOSPITAL_COMMUNITY): Payer: Self-pay

## 2011-12-20 DIAGNOSIS — O4700 False labor before 37 completed weeks of gestation, unspecified trimester: Secondary | ICD-10-CM | POA: Insufficient documentation

## 2011-12-20 DIAGNOSIS — O26899 Other specified pregnancy related conditions, unspecified trimester: Secondary | ICD-10-CM

## 2011-12-20 DIAGNOSIS — O99891 Other specified diseases and conditions complicating pregnancy: Secondary | ICD-10-CM | POA: Insufficient documentation

## 2011-12-20 DIAGNOSIS — R109 Unspecified abdominal pain: Secondary | ICD-10-CM

## 2011-12-20 LAB — URINALYSIS, ROUTINE W REFLEX MICROSCOPIC
Glucose, UA: NEGATIVE mg/dL
Hgb urine dipstick: NEGATIVE
Specific Gravity, Urine: 1.01 (ref 1.005–1.030)
Urobilinogen, UA: 0.2 mg/dL (ref 0.0–1.0)

## 2011-12-20 LAB — URINE MICROSCOPIC-ADD ON

## 2011-12-20 LAB — CBC
MCHC: 34.7 g/dL (ref 30.0–36.0)
MCV: 86.5 fL (ref 78.0–100.0)
Platelets: 243 10*3/uL (ref 150–400)
RDW: 13.1 % (ref 11.5–15.5)
WBC: 9.8 10*3/uL (ref 4.0–10.5)

## 2011-12-20 LAB — OB RESULTS CONSOLE GBS: GBS: NEGATIVE

## 2011-12-20 NOTE — MAU Provider Note (Signed)
History     CSN: [id number]  Arrival date and time: 12/20/11 0865   First Provider Initiated Contact with Patient 12/20/11 1907      Chief Complaint  Patient presents with  . Abdominal Pain   HPI Andrea Stone is 31 y.o. G2P1001 [redacted]w[redacted]d weeks presenting with pain around her "belly button"  To the lower abdomen since yesterday.  Patient of Dr. Elsie Stain.  Denies nausea, vomiting, GI sxs.  Denies vaginal bleeding or discharge.  Last intercourse 2 months ago.  Last seen in the office 7/3.  Denies complications with this pregnancy.      Past Medical History  Diagnosis Date  . Hypertension   . UTI (urinary tract infection) during pregnancy     Past Surgical History  Procedure Date  . No past surgeries     Family History  Problem Relation Age of Onset  . Anesthesia problems Neg Hx   . Other Neg Hx   . Hypertension Mother   . Asthma Sister   . Kidney disease Maternal Grandmother     History  Substance Use Topics  . Smoking status: Current Some Day Smoker -- 0.2 packs/day for 13 years    Types: Cigarettes  . Smokeless tobacco: Never Used  . Alcohol Use: No    Allergies: No Known Allergies  Prescriptions prior to admission  Medication Sig Dispense Refill  . acetaminophen (TYLENOL) 325 MG tablet Take 650 mg by mouth every 6 (six) hours as needed. For pain      . metroNIDAZOLE (FLAGYL) 500 MG tablet Take 500 mg by mouth 3 (three) times daily.      . Prenatal Vit-Fe Fumarate-FA (PRENATAL MULTIVITAMIN) TABS Take 1 tablet by mouth every morning.        ROS Physical Exam   Blood pressure 122/71, pulse 86, temperature 99.3 F (37.4 C), temperature source Oral, resp. rate 16, SpO2 99.00%.  Physical Exam  Constitutional: She is oriented to person, place, and time. She appears well-developed and well-nourished. No distress.  HENT:  Head: Normocephalic.  Neck: Normal range of motion.  Cardiovascular: Normal rate.   Respiratory: Effort normal.  GI: Soft.  Neurological:  She is alert and oriented to person, place, and time.  Skin: Skin is warm and dry.  Psychiatric: She has a normal mood and affect. Her behavior is normal.   Results for orders placed during the hospital encounter of 12/20/11 (from the past 24 hour(s))  URINALYSIS, ROUTINE W REFLEX MICROSCOPIC     Status: Abnormal   Collection Time   12/20/11  6:10 PM      Component Value Range   Color, Urine YELLOW  YELLOW   APPearance CLOUDY (*) CLEAR   Specific Gravity, Urine 1.010  1.005 - 1.030   pH 7.5  5.0 - 8.0   Glucose, UA NEGATIVE  NEGATIVE mg/dL   Hgb urine dipstick NEGATIVE  NEGATIVE   Bilirubin Urine NEGATIVE  NEGATIVE   Ketones, ur NEGATIVE  NEGATIVE mg/dL   Protein, ur NEGATIVE  NEGATIVE mg/dL   Urobilinogen, UA 0.2  0.0 - 1.0 mg/dL   Nitrite NEGATIVE  NEGATIVE   Leukocytes, UA MODERATE (*) NEGATIVE  URINE MICROSCOPIC-ADD ON     Status: Abnormal   Collection Time   12/20/11  6:10 PM      Component Value Range   Squamous Epithelial / LPF MANY (*) RARE   WBC, UA 3-6  <3 WBC/hpf   Bacteria, UA FEW (*) RARE   Casts GRANULAR CAST (*) NEGATIVE  CBC     Status: Abnormal   Collection Time   12/20/11  7:25 PM      Component Value Range   WBC 9.8  4.0 - 10.5 K/uL   RBC 3.56 (*) 3.87 - 5.11 MIL/uL   Hemoglobin 10.7 (*) 12.0 - 15.0 g/dL   HCT 57.8 (*) 46.9 - 62.9 %   MCV 86.5  78.0 - 100.0 fL   MCH 30.1  26.0 - 34.0 pg   MCHC 34.7  30.0 - 36.0 g/dL   RDW 52.8  41.3 - 24.4 %   Platelets 243  150 - 400 K/uL    MAU Course  Procedures  MDM Care turned over to Lilyan Punt, NP at [facility] reviewed.   Irregular contractions - FHT baseline 140, reactive strip.  Assessment and Plan  Abdominal pain at 33 weeks - unknown cause  Plan Urine culture pending Drink at least 8 8-oz glasses of water every day. Advise using an abdominal support maternity band Keep appointment in the office on 12-28-11 Call your doctor if you have questions or concerns.  KEY,EVE M 12/20/2011, 7:19 PM

## 2011-12-20 NOTE — MAU Note (Addendum)
Patient is here with c/o umbilicus and lower abdominal pulling/cramping when she moves. She states that she feels the pain when she changes positions and coughs. She denies any vaginal bleeding or lof. Reports good fetal movement.

## 2011-12-20 NOTE — MAU Note (Signed)
Patient states she started having abdominal pain all through the night. Has been unable to sleep. Denies any bleeding or leaking. Reports good fetal movement.

## 2011-12-22 LAB — URINE CULTURE

## 2012-01-19 ENCOUNTER — Inpatient Hospital Stay (HOSPITAL_COMMUNITY)
Admission: AD | Admit: 2012-01-19 | Discharge: 2012-01-19 | Disposition: A | Discharge status: Hospice | Payer: Medicaid Other | Source: Ambulatory Visit | Attending: Obstetrics | Admitting: Obstetrics

## 2012-01-19 ENCOUNTER — Encounter (HOSPITAL_COMMUNITY): Payer: Self-pay | Admitting: *Deleted

## 2012-01-19 DIAGNOSIS — R109 Unspecified abdominal pain: Secondary | ICD-10-CM | POA: Insufficient documentation

## 2012-01-19 DIAGNOSIS — O99891 Other specified diseases and conditions complicating pregnancy: Secondary | ICD-10-CM | POA: Insufficient documentation

## 2012-01-19 NOTE — MAU Note (Signed)
Cramping in abd started today.  Pain in sides started yesterday.

## 2012-01-22 ENCOUNTER — Inpatient Hospital Stay (HOSPITAL_COMMUNITY)
Admission: AD | Admit: 2012-01-22 | Discharge: 2012-01-24 | DRG: 775 | Disposition: A | Discharge status: Home / Self Care | Payer: Medicaid Other | Source: Ambulatory Visit | Attending: Obstetrics | Admitting: Obstetrics

## 2012-01-22 ENCOUNTER — Encounter (HOSPITAL_COMMUNITY): Payer: Self-pay | Admitting: Anesthesiology

## 2012-01-22 ENCOUNTER — Inpatient Hospital Stay (HOSPITAL_COMMUNITY): Payer: Medicaid Other | Admitting: Anesthesiology

## 2012-01-22 ENCOUNTER — Encounter (HOSPITAL_COMMUNITY): Payer: Self-pay

## 2012-01-22 LAB — COMPREHENSIVE METABOLIC PANEL
ALT: 11 U/L (ref 0–35)
AST: 20 U/L (ref 0–37)
Alkaline Phosphatase: 82 U/L (ref 39–117)
CO2: 22 mEq/L (ref 19–32)
Calcium: 9.3 mg/dL (ref 8.4–10.5)
Chloride: 103 mEq/L (ref 96–112)
GFR calc Af Amer: >90 mL/min (ref >90)
GFR calc non Af Amer: >90 mL/min (ref >90)
Glucose, Bld: 91 mg/dL (ref 70–99)
Potassium: 3.7 mEq/L (ref 3.5–5.1)
Sodium: 137 mEq/L (ref 135–145)

## 2012-01-22 LAB — CBC
HCT: 36 % (ref 36.0–46.0)
MCH: 30.2 pg (ref 26.0–34.0)
MCV: 85.7 fL (ref 78.0–100.0)
Platelets: 244 10*3/uL (ref 150–400)
RDW: 13.8 % (ref 11.5–15.5)
WBC: 10.1 10*3/uL (ref 4.0–10.5)

## 2012-01-22 MED ORDER — CITRIC ACID-SODIUM CITRATE 334-500 MG/5ML PO SOLN: 30.0000 mL | ORAL | Status: DC | PRN

## 2012-01-22 MED ORDER — DIBUCAINE 1 % RE OINT: 1.0000 "application " | TOPICAL_OINTMENT | RECTAL | Status: DC | PRN

## 2012-01-22 MED ORDER — ONDANSETRON HCL 4 MG PO TABS: 4.0000 mg | ORAL_TABLET | ORAL | Status: DC | PRN

## 2012-01-22 MED ORDER — WITCH HAZEL-GLYCERIN EX PADS: 1.0000 "application " | MEDICATED_PAD | CUTANEOUS | Status: DC | PRN

## 2012-01-22 MED ORDER — DIPHENHYDRAMINE HCL 25 MG PO CAPS: 25.0000 mg | ORAL_CAPSULE | Freq: Four times a day (QID) | ORAL | Status: DC | PRN

## 2012-01-22 MED ORDER — FENTANYL 2.5 MCG/ML BUPIVACAINE 1/10 % EPIDURAL INFUSION (WH - ANES)
14.0000 mL/h | INTRAMUSCULAR | Status: DC
  Administered 2012-01-22: 14 mL/h via EPIDURAL
  Filled 2012-01-22: qty 60

## 2012-01-22 MED ORDER — OXYCODONE-ACETAMINOPHEN 5-325 MG PO TABS
1.0000 | ORAL_TABLET | ORAL | Status: DC | PRN
  Administered 2012-01-22 – 2012-01-24 (×5): 1 via ORAL
  Filled 2012-01-22 (×3): qty 1

## 2012-01-22 MED ORDER — OXYTOCIN 40 UNITS IN LACTATED RINGERS INFUSION - SIMPLE MED
62.5000 mL/h | Freq: Once | INTRAVENOUS | Status: AC
  Administered 2012-01-22: 62.5 mL/h via INTRAVENOUS

## 2012-01-22 MED ORDER — OXYTOCIN BOLUS FROM INFUSION
250.0000 mL | Freq: Once | INTRAVENOUS | Status: AC
  Administered 2012-01-22: 250 mL via INTRAVENOUS
  Filled 2012-01-22: qty 500

## 2012-01-22 MED ORDER — ZOLPIDEM TARTRATE 5 MG PO TABS: 5.0000 mg | ORAL_TABLET | Freq: Every evening | ORAL | Status: DC | PRN

## 2012-01-22 MED ORDER — EPHEDRINE 5 MG/ML INJ
10.0000 mg | INTRAVENOUS | Status: DC | PRN
  Filled 2012-01-22: qty 4

## 2012-01-22 MED ORDER — LIDOCAINE HCL (PF) 1 % IJ SOLN: 30.0000 mL | INTRAMUSCULAR | Status: DC | PRN

## 2012-01-22 MED ORDER — LACTATED RINGERS IV SOLN
INTRAVENOUS | Status: DC
  Administered 2012-01-22 (×2): via INTRAVENOUS

## 2012-01-22 MED ORDER — PNEUMOCOCCAL VAC POLYVALENT 25 MCG/0.5ML IJ INJ
0.5000 mL | INJECTION | INTRAMUSCULAR | Status: DC
  Filled 2012-01-22: qty 0.5

## 2012-01-22 MED ORDER — PRENATAL MULTIVITAMIN CH
1.0000 | ORAL_TABLET | Freq: Every day | ORAL | Status: DC
  Administered 2012-01-22 – 2012-01-24 (×3): 1 via ORAL
  Filled 2012-01-22 (×3): qty 1

## 2012-01-22 MED ORDER — IBUPROFEN 600 MG PO TABS: 600.0000 mg | ORAL_TABLET | Freq: Four times a day (QID) | ORAL | Status: DC | PRN

## 2012-01-22 MED ORDER — LANOLIN HYDROUS EX OINT: TOPICAL_OINTMENT | CUTANEOUS | Status: DC | PRN

## 2012-01-22 MED ORDER — ONDANSETRON HCL 4 MG/2ML IJ SOLN
4.0000 mg | Freq: Four times a day (QID) | INTRAMUSCULAR | Status: DC | PRN
  Administered 2012-01-22: 4 mg via INTRAVENOUS
  Filled 2012-01-22: qty 2

## 2012-01-22 MED ORDER — OXYTOCIN 40 UNITS IN LACTATED RINGERS INFUSION - SIMPLE MED
INTRAVENOUS | Status: AC
  Filled 2012-01-22: qty 1000

## 2012-01-22 MED ORDER — IBUPROFEN 600 MG PO TABS
600.0000 mg | ORAL_TABLET | Freq: Four times a day (QID) | ORAL | Status: DC
  Administered 2012-01-22 – 2012-01-24 (×9): 600 mg via ORAL
  Filled 2012-01-22 (×10): qty 1

## 2012-01-22 MED ORDER — LACTATED RINGERS IV SOLN: 500.0000 mL | INTRAVENOUS | Status: DC | PRN

## 2012-01-22 MED ORDER — SENNOSIDES-DOCUSATE SODIUM 8.6-50 MG PO TABS
2.0000 | ORAL_TABLET | Freq: Every day | ORAL | Status: DC
  Administered 2012-01-22 – 2012-01-23 (×2): 2 via ORAL

## 2012-01-22 MED ORDER — ACETAMINOPHEN 325 MG PO TABS: 650.0000 mg | ORAL_TABLET | ORAL | Status: DC | PRN

## 2012-01-22 MED ORDER — LIDOCAINE HCL (PF) 1 % IJ SOLN
INTRAMUSCULAR | Status: AC
  Filled 2012-01-22: qty 30

## 2012-01-22 MED ORDER — EPHEDRINE 5 MG/ML INJ: 10.0000 mg | INTRAVENOUS | Status: DC | PRN

## 2012-01-22 MED ORDER — DIPHENHYDRAMINE HCL 50 MG/ML IJ SOLN: 12.5000 mg | INTRAMUSCULAR | Status: DC | PRN

## 2012-01-22 MED ORDER — PHENYLEPHRINE 40 MCG/ML (10ML) SYRINGE FOR IV PUSH (FOR BLOOD PRESSURE SUPPORT)
80.0000 ug | PREFILLED_SYRINGE | INTRAVENOUS | Status: DC | PRN
  Filled 2012-01-22: qty 5

## 2012-01-22 MED ORDER — BENZOCAINE-MENTHOL 20-0.5 % EX AERO: 1.0000 "application " | INHALATION_SPRAY | CUTANEOUS | Status: DC | PRN

## 2012-01-22 MED ORDER — FERROUS SULFATE 325 (65 FE) MG PO TABS
325.0000 mg | ORAL_TABLET | Freq: Two times a day (BID) | ORAL | Status: DC
  Administered 2012-01-22 – 2012-01-24 (×4): 325 mg via ORAL
  Filled 2012-01-22 (×4): qty 1

## 2012-01-22 MED ORDER — OXYCODONE-ACETAMINOPHEN 5-325 MG PO TABS
1.0000 | ORAL_TABLET | ORAL | Status: DC | PRN
  Filled 2012-01-22 (×3): qty 1

## 2012-01-22 MED ORDER — PHENYLEPHRINE 40 MCG/ML (10ML) SYRINGE FOR IV PUSH (FOR BLOOD PRESSURE SUPPORT): 80.0000 ug | PREFILLED_SYRINGE | INTRAVENOUS | Status: DC | PRN

## 2012-01-22 MED ORDER — SIMETHICONE 80 MG PO CHEW: 80.0000 mg | CHEWABLE_TABLET | ORAL | Status: DC | PRN

## 2012-01-22 MED ORDER — ONDANSETRON HCL 4 MG/2ML IJ SOLN: 4.0000 mg | INTRAMUSCULAR | Status: DC | PRN

## 2012-01-22 MED ORDER — LACTATED RINGERS IV SOLN
500.0000 mL | Freq: Once | INTRAVENOUS | Status: AC
  Administered 2012-01-22: 500 mL via INTRAVENOUS

## 2012-01-22 MED ORDER — FLEET ENEMA 7-19 GM/118ML RE ENEM: 1.0000 | ENEMA | RECTAL | Status: DC | PRN

## 2012-01-22 MED ORDER — TETANUS-DIPHTH-ACELL PERTUSSIS 5-2.5-18.5 LF-MCG/0.5 IM SUSP
0.5000 mL | Freq: Once | INTRAMUSCULAR | Status: AC
  Administered 2012-01-24: 0.5 mL via INTRAMUSCULAR
  Filled 2012-01-22: qty 0.5

## 2012-01-22 MED ORDER — BUTORPHANOL TARTRATE 1 MG/ML IJ SOLN
1.0000 mg | INTRAMUSCULAR | Status: DC | PRN
  Filled 2012-01-22: qty 1

## 2012-01-22 MED ORDER — LIDOCAINE HCL (PF) 1 % IJ SOLN
INTRAMUSCULAR | Status: DC | PRN
  Administered 2012-01-22 (×2): 5 mL

## 2012-01-22 NOTE — MAU Note (Signed)
Patient is brought in by ems with c/o possible leaking amniotic fluids and frequent contractions. She reports good fetal movement.

## 2012-01-22 NOTE — Progress Notes (Signed)
Dr Gaynell Face notified of patient. Order to admit to l/d

## 2012-01-22 NOTE — H&P (Signed)
This is Dr. Francoise Ceo dictating the history and physical on  Andrea Stone she's a 31 year old gravida 2 para 1001 at 65 weeks her EDC is [phone] and she was admitted in labor 8 cm dilated vertex -2 station 90% I'm abdomen performed fluid clear negative GBS and she is contracting every 2 minutes Past medical history negative Past surgical history negative Social history noncontributory Family history negative System review HEENT noncontributory physical exam HEENT negative breasts negative Heart regular rhythm no murmurs no gallops Lungs clear to P&A Abdomen term Pelvic as described above Extremities negative

## 2012-01-22 NOTE — Anesthesia Procedure Notes (Signed)
Epidural Patient location during procedure: OB Start time: 01/22/2012 5:16 AM  Staffing Anesthesiologist: Brayton Caves R Performed by: anesthesiologist   Preanesthetic Checklist Completed: patient identified, site marked, surgical consent, pre-op evaluation, timeout performed, IV checked, risks and benefits discussed and monitors and equipment checked  Epidural Patient position: sitting Prep: site prepped and draped and DuraPrep Patient monitoring: continuous pulse ox and blood pressure Approach: midline Injection technique: LOR air and LOR saline  Needle:  Needle type: Tuohy  Needle gauge: 17 G Needle length: 9 cm Needle insertion depth: 5 cm cm Catheter type: closed end flexible Catheter size: 19 Gauge Catheter at skin depth: 10 cm Test dose: negative  Assessment Events: blood not aspirated, injection not painful, no injection resistance, negative IV test and no paresthesia  Additional Notes Patient identified.  Risk benefits discussed including failed block, incomplete pain control, headache, nerve damage, paralysis, blood pressure changes, nausea, vomiting, reactions to medication both toxic or allergic, and postpartum back pain.  Patient expressed understanding and wished to proceed.  All questions were answered.  Sterile technique used throughout procedure and epidural site dressed with sterile barrier dressing. No paresthesia or other complications noted.The patient did not experience any signs of intravascular injection such as tinnitus or metallic taste in mouth nor signs of intrathecal spread such as rapid motor block. Please see nursing notes for vital signs.

## 2012-01-22 NOTE — Plan of Care (Signed)
Problem: Consults Goal: Birthing Suites Patient Information Press F2 to bring up selections list  Outcome: Completed/Met Date Met:  01/22/12  Pt 37-[redacted] weeks EGA  Comments:  38.3 wks

## 2012-01-22 NOTE — Anesthesia Preprocedure Evaluation (Signed)
Anesthesia Evaluation  Patient identified by MRN, date of birth, ID band Patient awake    Reviewed: Allergy & Precautions, H&P , Patient's Chart, lab work & pertinent test results  Airway Mallampati: II TM Distance: >3 FB Neck ROM: full    Dental No notable dental hx.    Pulmonary neg pulmonary ROS,  breath sounds clear to auscultation  Pulmonary exam normal       Cardiovascular hypertension, negative cardio ROS  Rhythm:regular Rate:Normal     Neuro/Psych negative neurological ROS  negative psych ROS   GI/Hepatic negative GI ROS, Neg liver ROS,   Endo/Other  negative endocrine ROS  Renal/GU negative Renal ROS     Musculoskeletal   Abdominal   Peds  Hematology negative hematology ROS (+)   Anesthesia Other Findings   Reproductive/Obstetrics (+) Pregnancy                           Anesthesia Physical Anesthesia Plan  ASA: II  Anesthesia Plan: Epidural   Post-op Pain Management:    Induction:   Airway Management Planned:   Additional Equipment:   Intra-op Plan:   Post-operative Plan:   Informed Consent: I have reviewed the patients History and Physical, chart, labs and discussed the procedure including the risks, benefits and alternatives for the proposed anesthesia with the patient or authorized representative who has indicated his/her understanding and acceptance.     Plan Discussed with:   Anesthesia Plan Comments:         Anesthesia Quick Evaluation

## 2012-01-23 LAB — CBC
HCT: 30.9 % — ABNORMAL LOW (ref 36.0–46.0)
MCHC: 35 g/dL (ref 30.0–36.0)
MCV: 86.1 fL (ref 78.0–100.0)
RDW: 13.7 % (ref 11.5–15.5)
WBC: 13.8 10*3/uL — ABNORMAL HIGH (ref 4.0–10.5)

## 2012-01-23 NOTE — Anesthesia Postprocedure Evaluation (Signed)
  Anesthesia Post-op Note  Patient: Andrea Stone  Procedure(s) Performed: * No procedures listed *  Patient Location: Mother/Baby  Anesthesia Type: Epidural  Level of Consciousness: awake, alert  and oriented  Airway and Oxygen Therapy: Patient Spontanous Breathing  Post-op Pain: none  Post-op Assessment: Post-op Vital signs reviewed, Patient's Cardiovascular Status Stable, No headache, No backache, No residual numbness and No residual motor weakness  Post-op Vital Signs: Reviewed and stable  Complications: No apparent anesthesia complications

## 2012-01-23 NOTE — Progress Notes (Signed)
Patient ID: Andrea Stone, female   DOB: 10/15/1980, 31 y.o.   MRN: [id number] Postpartum day one Vital signs normal Fundus firm Lochia moderate Legs negative No problems

## 2012-01-23 NOTE — Progress Notes (Signed)
UR chart review completed.  

## 2012-01-24 NOTE — Discharge Summary (Signed)
Obstetric Discharge Summary Reason for Admission: onset of labor Prenatal Procedures: none Intrapartum Procedures: spontaneous vaginal delivery Postpartum Procedures: none Complications-Operative and Postpartum: none Hemoglobin  Date Value Range Status  01/23/2012 10.8* 12.0 - 15.0 g/dL Final     HCT  Date Value Range Status  01/23/2012 30.9* 36.0 - 46.0 % Final    Physical Exam:  General: alert and delirious Lochia: appropriate Uterine Fundus: firm Incision: healing well, dehiscence present and and and and and and and DVT Evaluation: No evidence of DVT seen on physical exam.and and and  Discharge Diagnoses: Term Pregnancy-delivered  Discharge Information: Date: 01/24/2012 Activity: pelvic rest Diet: routine Medications: Percocet Condition: stable Instructions: refer to practice specific booklet Discharge to: home Follow-up Information    Follow up with Reniah Cottingham A, MD. Call in 6 weeks.   Contact information:   [address] [place] [address] [phone]          Newborn Data: Live born female  Birth Weight: 6 lb 7.4 oz (2930 g) APGAR: 9, 9  Home with mother.  Adaeze Better A 01/24/2012, 7:39 AMand he

## 2012-01-24 NOTE — Progress Notes (Signed)
Patient ID: Andrea Stone, female   DOB: 28-Aug-1980, 31 y.o.   MRN: [id number] Postpartum day 2 Vital signs normal fundus firm Lochia moderate Legs negative no complaints discharge today

## 2012-06-09 ENCOUNTER — Inpatient Hospital Stay (HOSPITAL_COMMUNITY): Payer: Medicaid Other

## 2012-06-09 ENCOUNTER — Inpatient Hospital Stay (HOSPITAL_COMMUNITY)
Admission: AD | Admit: 2012-06-09 | Discharge: 2012-06-09 | Disposition: A | Discharge status: Home / Self Care | Payer: Medicaid Other | Source: Ambulatory Visit | Attending: Obstetrics | Admitting: Obstetrics

## 2012-06-09 ENCOUNTER — Encounter (HOSPITAL_COMMUNITY): Payer: Self-pay | Admitting: *Deleted

## 2012-06-09 DIAGNOSIS — A5901 Trichomonal vulvovaginitis: Secondary | ICD-10-CM

## 2012-06-09 DIAGNOSIS — O209 Hemorrhage in early pregnancy, unspecified: Secondary | ICD-10-CM | POA: Insufficient documentation

## 2012-06-09 DIAGNOSIS — O98819 Other maternal infectious and parasitic diseases complicating pregnancy, unspecified trimester: Secondary | ICD-10-CM | POA: Insufficient documentation

## 2012-06-09 LAB — CBC
HCT: 30.6 % — ABNORMAL LOW (ref 36.0–46.0)
MCH: 29.5 pg (ref 26.0–34.0)
MCV: 85.2 fL (ref 78.0–100.0)
Platelets: 416 10*3/uL — ABNORMAL HIGH (ref 150–400)
RBC: 3.59 MIL/uL — ABNORMAL LOW (ref 3.87–5.11)

## 2012-06-09 LAB — WET PREP, GENITAL: Yeast Wet Prep HPF POC: NONE SEEN

## 2012-06-09 LAB — URINALYSIS, ROUTINE W REFLEX MICROSCOPIC
Bilirubin Urine: NEGATIVE
Glucose, UA: NEGATIVE mg/dL
Hgb urine dipstick: NEGATIVE
Specific Gravity, Urine: <1.005 — ABNORMAL LOW (ref 1.005–1.030)
Urobilinogen, UA: 0.2 mg/dL (ref 0.0–1.0)

## 2012-06-09 LAB — URINE MICROSCOPIC-ADD ON

## 2012-06-09 LAB — POCT PREGNANCY, URINE: Preg Test, Ur: POSITIVE — AB

## 2012-06-09 MED ORDER — ONDANSETRON HCL 4 MG PO TABS
8.0000 mg | ORAL_TABLET | Freq: Once | ORAL | Status: AC
  Administered 2012-06-09: 8 mg via ORAL
  Filled 2012-06-09: qty 2

## 2012-06-09 MED ORDER — METRONIDAZOLE 500 MG PO TABS
2000.0000 mg | ORAL_TABLET | Freq: Once | ORAL | Status: AC
Start: 2012-06-09 — End: 2012-06-09
  Administered 2012-06-09: 2000 mg via ORAL
  Filled 2012-06-09: qty 4

## 2012-06-09 NOTE — MAU Note (Signed)
Patient is unsure of exactly when her last period was

## 2012-06-09 NOTE — MAU Provider Note (Signed)
History     CSN: [id number]  Arrival date and time: 06/09/12 1516   First Provider Initiated Contact with Patient 06/09/12 1613      Chief Complaint  Patient presents with  . Vaginal Bleeding   HPI  Andrea Stone is a 31 y.o. G3P2002 at [redacted]w[redacted]d who presents today with vaginal bleeding. She states that it started last night, and has been spotty in nature. The bleeding is mostly there when she wipes. She has also had a small amount of vaginal discharge. She is also having some mild/intermittent cramps  Past Medical History  Diagnosis Date  . Hypertension   . UTI (urinary tract infection) during pregnancy     No past surgical history on file.  Family History  Problem Relation Age of Onset  . Anesthesia problems Neg Hx   . Other Neg Hx   . Hypertension Mother   . Asthma Sister   . Kidney disease Maternal Grandmother     History  Substance Use Topics  . Smoking status: Current Some Day Smoker -- 0.2 packs/day for 13 years    Types: Cigarettes  . Smokeless tobacco: Never Used  . Alcohol Use: No    Allergies: No Known Allergies  Prescriptions prior to admission  Medication Sig Dispense Refill  . nitrofurantoin, macrocrystal-monohydrate, (MACROBID) 100 MG capsule Take 100 mg by mouth 2 (two) times daily. Started 06/07/12 x 7 days      . phenazopyridine (PYRIDIUM) 200 MG tablet Take 200 mg by mouth 2 (two) times daily as needed. pain      . Prenatal Vit-Fe Fumarate-FA (PRENATAL MULTIVITAMIN) TABS Take 1 tablet by mouth every morning.        Review of Systems  Constitutional: Negative for fever.  Gastrointestinal: Positive for abdominal pain. Negative for nausea, vomiting, diarrhea and constipation.  Genitourinary: Negative for dysuria, urgency and frequency.  Neurological: Negative for dizziness and headaches.   Physical Exam   Blood pressure 120/68, pulse 108, temperature 98 F (36.7 C), temperature source Oral, resp. rate 18, height 5\' 2"  (1.575 m), weight 61.236  kg (135 lb), last menstrual period 03/02/2012, unknown if currently breastfeeding.  Physical Exam  Nursing note and vitals reviewed. Constitutional: She is oriented to person, place, and time. She appears well-developed and well-nourished. No distress.  Cardiovascular: Normal rate.   Respiratory: Effort normal.  GI: Soft.  Genitourinary:        External: normal Vagina: copious frothy white discharge Cervix: pink, parous, smooth, no CMT Uterus: 13-14 weeks size Adnexa: NT   Neurological: She is alert and oriented to person, place, and time.  Skin: Skin is warm and dry.  Psychiatric: She has a normal mood and affect.    MAU Course  Procedures        Results for orders placed during the hospital encounter of 06/09/12 (from the past 24 hour(s))  URINALYSIS, ROUTINE W REFLEX MICROSCOPIC     Status: Abnormal   Collection Time   06/09/12  3:30 PM      Component Value Range   Color, Urine YELLOW  YELLOW   APPearance CLEAR  CLEAR   Specific Gravity, Urine <1.005 (*) 1.005 - 1.030   pH 6.0  5.0 - 8.0   Glucose, UA NEGATIVE  NEGATIVE mg/dL   Hgb urine dipstick NEGATIVE  NEGATIVE   Bilirubin Urine NEGATIVE  NEGATIVE   Ketones, ur NEGATIVE  NEGATIVE mg/dL   Protein, ur NEGATIVE  NEGATIVE mg/dL   Urobilinogen, UA 0.2  0.0 - 1.0 mg/dL  Nitrite POSITIVE (*) NEGATIVE   Leukocytes, UA LARGE (*) NEGATIVE  URINE MICROSCOPIC-ADD ON     Status: Abnormal   Collection Time   06/09/12  3:30 PM      Component Value Range   Squamous Epithelial / LPF FEW (*) RARE   WBC, UA 11-20  <3 WBC/hpf   Urine-Other TRICHOMONAS PRESENT    POCT PREGNANCY, URINE     Status: Abnormal   Collection Time   06/09/12  3:35 PM      Component Value Range   Preg Test, Ur POSITIVE (*) NEGATIVE  CBC     Status: Abnormal   Collection Time   06/09/12  4:00 PM      Component Value Range   WBC 11.3 (*) 4.0 - 10.5 K/uL   RBC 3.59 (*) 3.87 - 5.11 MIL/uL   Hemoglobin 10.6 (*) 12.0 - 15.0 g/dL   HCT 95.6 (*)  21.3 - 46.0 %   MCV 85.2  78.0 - 100.0 fL   MCH 29.5  26.0 - 34.0 pg   MCHC 34.6  30.0 - 36.0 g/dL   RDW 08.6  57.8 - 46.9 %   Platelets 416 (*) 150 - 400 K/uL  HCG, QUANTITATIVE, PREGNANCY     Status: Abnormal   Collection Time   06/09/12  4:00 PM      Component Value Range   hCG, Beta Chain, Quant, [place] [address] (*) <5 mIU/mL  WET PREP, GENITAL     Status: Abnormal   Collection Time   06/09/12  4:15 PM      Component Value Range   Yeast Wet Prep HPF POC NONE SEEN  NONE SEEN   Trich, Wet Prep FEW (*) NONE SEEN   Clue Cells Wet Prep HPF POC FEW (*) NONE SEEN   WBC, Wet Prep HPF POC MANY (*) NONE SEEN   Assessment and Plan   1. Trichomonas vaginitis   Treated here in MAU Discussed partner treatment at the health department  Second trimester danger signs reviewed Start [facility] ASAP  Tawnya Crook 06/09/2012, 4:20 PM

## 2012-06-09 NOTE — MAU Note (Signed)
Pt reports having vaginal bleeding that started last night (when she wipes) c/o lower abd and right side pain as well.

## 2012-06-13 NOTE — L&D Delivery Note (Signed)
Delivery Note At 12:26 PM a viable female was delivered via  (Presentation: ;  ).  APGAR: , ; weight .   Placenta status: , .  Cord:  with the following complications: .  Cord pH: not done  Anesthesia:   Episiotomy:  Lacerations:  Suture Repair: 2.0 Est. Blood Loss (mL):   Mom to postpartum.  Baby to nursery-stable.  Andrea Stone A 11/14/2012, 12:33 PM

## 2012-07-08 LAB — OB RESULTS CONSOLE GC/CHLAMYDIA: Chlamydia: NEGATIVE

## 2012-07-08 LAB — OB RESULTS CONSOLE HIV ANTIBODY (ROUTINE TESTING): HIV: NONREACTIVE

## 2012-07-08 LAB — OB RESULTS CONSOLE RPR: RPR: NONREACTIVE

## 2012-08-20 ENCOUNTER — Encounter (HOSPITAL_COMMUNITY): Payer: Self-pay | Admitting: Obstetrics and Gynecology

## 2012-08-20 ENCOUNTER — Inpatient Hospital Stay (HOSPITAL_COMMUNITY)
Admission: AD | Admit: 2012-08-20 | Discharge: 2012-08-21 | Disposition: A | Discharge status: Home / Self Care | Payer: Medicaid Other | Source: Ambulatory Visit | Attending: Obstetrics | Admitting: Obstetrics

## 2012-08-20 DIAGNOSIS — S39012A Strain of muscle, fascia and tendon of lower back, initial encounter: Secondary | ICD-10-CM

## 2012-08-20 DIAGNOSIS — M545 Low back pain, unspecified: Secondary | ICD-10-CM | POA: Insufficient documentation

## 2012-08-20 DIAGNOSIS — N949 Unspecified condition associated with female genital organs and menstrual cycle: Secondary | ICD-10-CM

## 2012-08-20 DIAGNOSIS — O99891 Other specified diseases and conditions complicating pregnancy: Secondary | ICD-10-CM | POA: Insufficient documentation

## 2012-08-20 LAB — URINALYSIS, ROUTINE W REFLEX MICROSCOPIC
Bilirubin Urine: NEGATIVE
Ketones, ur: NEGATIVE mg/dL
Nitrite: NEGATIVE
Protein, ur: NEGATIVE mg/dL
Urobilinogen, UA: 0.2 mg/dL (ref 0.0–1.0)

## 2012-08-20 MED ORDER — CYCLOBENZAPRINE HCL 10 MG PO TABS
10.0000 mg | ORAL_TABLET | Freq: Once | ORAL | Status: AC
  Administered 2012-08-20: 10 mg via ORAL
  Filled 2012-08-20: qty 1

## 2012-08-20 NOTE — MAU Provider Note (Signed)
Chief Complaint  Patient presents with  . Back Pain    SUBJECTIVE:  Andrea Stone is a 32 y.o. G3P2002 at [redacted]w[redacted]d  presenting with 3 day hx of sharp catching pains in low mid back whenever she flexes her hip or tries to get up from lying. Worse with walking. Spends most of the day in a recliner.No known injury to back. No dysuria, urgency or frequency. She denies contractions, vaginal bleeding or leakage of fluid. Fetus is active.   ROS: Negative except as noted above.  OBJECTIVE:  Filed Vitals:   08/20/12 2252  BP: 112/66  Pulse: 70  Temp: 98.2 F (36.8 C)  Resp: 18    Gen: NAD. Looks uncomfortable when moving Abd: soft, minimally TTP right groin Back: paraspinous L-S TTP. NT over buttocks. Neg CVAT Cx: L/C/H FHR: 145-155 UCs: none  Results for orders placed during the hospital encounter of 08/20/12 (from the past 24 hour(s))  URINALYSIS, ROUTINE W REFLEX MICROSCOPIC     Status: Abnormal   Collection Time    08/20/12 10:23 PM      Result Value Range   Color, Urine YELLOW  YELLOW   APPearance CLEAR  CLEAR   Specific Gravity, Urine 1.010  1.005 - 1.030   pH 5.5  5.0 - 8.0   Glucose, UA NEGATIVE  NEGATIVE mg/dL   Hgb urine dipstick NEGATIVE  NEGATIVE   Bilirubin Urine NEGATIVE  NEGATIVE   Ketones, ur NEGATIVE  NEGATIVE mg/dL   Protein, ur NEGATIVE  NEGATIVE mg/dL   Urobilinogen, UA 0.2  0.0 - 1.0 mg/dL   Nitrite NEGATIVE  NEGATIVE   Leukocytes, UA MODERATE (*) NEGATIVE  URINE MICROSCOPIC-ADD ON     Status: Abnormal   Collection Time    08/20/12 10:23 PM      Result Value Range   Squamous Epithelial / LPF FEW (*) RARE   WBC, UA 7-10  <3 WBC/hpf   Bacteria, UA FEW (*) RARE   MAU Course: Flexeril 10 mg po with some relief Urine culture sent  ASSESSMENT: [id number] @ [redacted]w[redacted]d Round Ligament Pain Musculoskeletal low back pain  PLAN: D/W Dr. Tamela Oddi Reassurance given and general relief measures reviewed: avoidance of precipitating movements, instructions on  abdominal tightening/pelvic rock exercises, abdominal binder, rest with hip flexion. AVS on RLP and back strain Follow-up Information   Follow up with Kathreen Cosier, MD On 08/21/2012.   Contact information:   [address] [place] [address] [phone]

## 2012-08-20 NOTE — MAU Note (Signed)
Patient states she is having sharp pains in her back, top of the the abdomen and right side.

## 2012-08-21 ENCOUNTER — Encounter (HOSPITAL_COMMUNITY): Payer: Self-pay | Admitting: *Deleted

## 2012-08-21 ENCOUNTER — Inpatient Hospital Stay (HOSPITAL_COMMUNITY)
Admission: AD | Admit: 2012-08-21 | Discharge: 2012-08-22 | Disposition: A | Discharge status: Home / Self Care | Payer: Medicaid Other | Source: Ambulatory Visit | Attending: Obstetrics | Admitting: Obstetrics

## 2012-08-21 DIAGNOSIS — A084 Viral intestinal infection, unspecified: Secondary | ICD-10-CM

## 2012-08-21 DIAGNOSIS — A088 Other specified intestinal infections: Secondary | ICD-10-CM | POA: Insufficient documentation

## 2012-08-21 DIAGNOSIS — O99891 Other specified diseases and conditions complicating pregnancy: Secondary | ICD-10-CM | POA: Insufficient documentation

## 2012-08-21 DIAGNOSIS — O212 Late vomiting of pregnancy: Secondary | ICD-10-CM | POA: Insufficient documentation

## 2012-08-21 LAB — URINALYSIS, ROUTINE W REFLEX MICROSCOPIC
Glucose, UA: NEGATIVE mg/dL
Ketones, ur: 15 mg/dL — AB
Nitrite: NEGATIVE
Protein, ur: 100 mg/dL — AB

## 2012-08-21 LAB — URINE MICROSCOPIC-ADD ON

## 2012-08-21 MED ORDER — LACTATED RINGERS IV BOLUS (SEPSIS)
1000.0000 mL | Freq: Once | INTRAVENOUS | Status: AC
  Administered 2012-08-21: 1000 mL via INTRAVENOUS

## 2012-08-21 MED ORDER — ONDANSETRON HCL 4 MG/2ML IJ SOLN
4.0000 mg | Freq: Once | INTRAMUSCULAR | Status: AC
  Administered 2012-08-21: 4 mg via INTRAVENOUS
  Filled 2012-08-21: qty 2

## 2012-08-21 NOTE — MAU Note (Signed)
Pt states she has been vomiting since 10am this morning-cannot keep anyting down she states

## 2012-08-21 NOTE — MAU Note (Signed)
Pt states she has been having nausea and vomiting since this morning about0830 and diarrhea about the same time

## 2012-08-21 NOTE — MAU Provider Note (Signed)
History     CSN: [id number]  Arrival date and time: 08/21/12 2158   First Andrea Stone Initiated Contact with Patient 08/21/12 2304      Chief Complaint  Patient presents with  . Emesis   HPI  [name] is a 32 y.o. G3P2002 at [redacted]w[redacted]d who presents to MAU today with complaint of N/V. The patient was seen in the office today and told by Dr. Gaynell Face that this was most likely viral and would resolve on its own. The patient states that the N/V started today. She has had one episode of loose stool. She denies fever, vaginal bleeding, abnormal discharge, contractions or abdominal pain. She reports good fetal movement.   OB History   Grav Para Term Preterm Abortions TAB SAB Ect Mult Living   3 2 2  0 0 0 0 0 0 2      Past Medical History  Diagnosis Date  . Hypertension   . UTI (urinary tract infection) during pregnancy     History reviewed. No pertinent past surgical history.  Family History  Problem Relation Age of Onset  . Anesthesia problems Neg Hx   . Other Neg Hx   . Hypertension Mother   . Asthma Sister   . Kidney disease Maternal Grandmother     History  Substance Use Topics  . Smoking status: Current Some Day Smoker -- 0.25 packs/day for 13 years    Types: Cigarettes  . Smokeless tobacco: Never Used  . Alcohol Use: No    Allergies: No Known Allergies  Prescriptions prior to admission  Medication Sig Dispense Refill  . acetaminophen-codeine (TYLENOL #3) 300-30 MG per tablet Take 1 tablet by mouth every 4 (four) hours as needed for pain.      . nitrofurantoin, macrocrystal-monohydrate, (MACROBID) 100 MG capsule Take 100 mg by mouth 2 (two) times daily. Started 06/07/12 x 7 days      . phenazopyridine (PYRIDIUM) 200 MG tablet Take 200 mg by mouth 2 (two) times daily as needed. pain      . Prenatal Vit-Fe Fumarate-FA (PRENATAL MULTIVITAMIN) TABS Take 1 tablet by mouth every morning.        Review of Systems  Constitutional: Negative for fever and  malaise/fatigue.  Gastrointestinal: Positive for nausea, vomiting and diarrhea. Negative for abdominal pain and constipation.  Genitourinary: Negative for dysuria, urgency and frequency.       Neg - vaginal bleeding Neg - abnormal discharge   Musculoskeletal: Positive for back pain.  Neurological: Negative for dizziness and headaches.   Physical Exam   Blood pressure 106/52, pulse 117, temperature 98.6 F (37 C), resp. rate 20, height 5\' 4"  (1.626 m), weight 135 lb 4 oz (61.349 kg), last menstrual period 03/02/2012, SpO2 100.00%.  Physical Exam  Constitutional: She is oriented to person, place, and time. She appears well-developed and well-nourished. No distress.  HENT:  Head: Normocephalic and atraumatic.  Cardiovascular: Normal rate, regular rhythm and normal heart sounds.   Respiratory: Effort normal and breath sounds normal. No respiratory distress.  GI: Soft. Bowel sounds are normal. She exhibits no distension and no mass. There is tenderness (diffuse mild abdominal discomfort with palpation). There is no rebound and no guarding.  Neurological: She is alert and oriented to person, place, and time.  Skin: Skin is warm and dry. No erythema.  Psychiatric: She has a normal mood and affect.   Results for orders placed during the hospital encounter of 08/21/12 (from the past 24 hour(s))  URINALYSIS, ROUTINE W REFLEX  MICROSCOPIC     Status: Abnormal   Collection Time    08/21/12 10:25 PM      Result Value Range   Color, Urine YELLOW  YELLOW   APPearance CLEAR  CLEAR   Specific Gravity, Urine >1.030 (*) 1.005 - 1.030   pH 6.0  5.0 - 8.0   Glucose, UA NEGATIVE  NEGATIVE mg/dL   Hgb urine dipstick NEGATIVE  NEGATIVE   Bilirubin Urine NEGATIVE  NEGATIVE   Ketones, ur 15 (*) NEGATIVE mg/dL   Protein, ur 119 (*) NEGATIVE mg/dL   Urobilinogen, UA 0.2  0.0 - 1.0 mg/dL   Nitrite NEGATIVE  NEGATIVE   Leukocytes, UA TRACE (*) NEGATIVE  URINE MICROSCOPIC-ADD ON     Status: Abnormal    Collection Time    08/21/12 10:25 PM      Result Value Range   Squamous Epithelial / LPF MANY (*) RARE   WBC, UA 3-6  <3 WBC/hpf   RBC / HPF 3-6  <3 RBC/hpf   Bacteria, UA FEW (*) RARE    MAU Course  Procedures None  MDM 1 L LR with Zofran given. Patient reports significant improvement in her symptoms. She is able to take in PO here prior to discharge.   Assessment and Plan  A: Viral gastroenteritis  P: Discharge home Follow-up with Dr. Gaynell Face 09/04/12 as scheduled Rx zofran and phenergan sent to patient's pharmacy Encouraged increased PO hydration as tolerated. BRAT diet information on AVS Return to MAU as needed or if symptoms should change or worsen  Freddi Starr, PA-C  08/21/2012, 11:04 PM

## 2012-08-22 LAB — URINE CULTURE: Colony Count: NO GROWTH

## 2012-08-22 MED ORDER — ONDANSETRON HCL 4 MG PO TABS: 4.0000 mg | ORAL_TABLET | Freq: Four times a day (QID) | ORAL | Status: DC

## 2012-08-22 MED ORDER — PROMETHAZINE HCL 25 MG PO TABS: 25.0000 mg | ORAL_TABLET | Freq: Four times a day (QID) | ORAL | Status: DC | PRN

## 2012-09-12 ENCOUNTER — Inpatient Hospital Stay (HOSPITAL_COMMUNITY)
Admission: AD | Admit: 2012-09-12 | Discharge: 2012-09-12 | Disposition: A | Discharge status: Home / Self Care | Payer: Medicaid Other | Source: Ambulatory Visit | Attending: Obstetrics | Admitting: Obstetrics

## 2012-09-12 ENCOUNTER — Encounter (HOSPITAL_COMMUNITY): Payer: Self-pay

## 2012-09-12 DIAGNOSIS — R109 Unspecified abdominal pain: Secondary | ICD-10-CM | POA: Insufficient documentation

## 2012-09-12 DIAGNOSIS — O209 Hemorrhage in early pregnancy, unspecified: Secondary | ICD-10-CM | POA: Insufficient documentation

## 2012-09-12 DIAGNOSIS — O99891 Other specified diseases and conditions complicating pregnancy: Secondary | ICD-10-CM | POA: Insufficient documentation

## 2012-09-12 DIAGNOSIS — O4700 False labor before 37 completed weeks of gestation, unspecified trimester: Secondary | ICD-10-CM | POA: Insufficient documentation

## 2012-09-12 DIAGNOSIS — M549 Dorsalgia, unspecified: Secondary | ICD-10-CM | POA: Insufficient documentation

## 2012-09-12 DIAGNOSIS — O479 False labor, unspecified: Secondary | ICD-10-CM

## 2012-09-12 DIAGNOSIS — N859 Noninflammatory disorder of uterus, unspecified: Secondary | ICD-10-CM

## 2012-09-12 LAB — FETAL FIBRONECTIN: Fetal Fibronectin: NEGATIVE

## 2012-09-12 LAB — URINALYSIS, ROUTINE W REFLEX MICROSCOPIC
Bilirubin Urine: NEGATIVE
Glucose, UA: NEGATIVE mg/dL
Ketones, ur: NEGATIVE mg/dL
Protein, ur: NEGATIVE mg/dL

## 2012-09-12 MED ORDER — TERBUTALINE SULFATE 1 MG/ML IJ SOLN
0.2500 mg | Freq: Once | INTRAMUSCULAR | Status: AC
  Administered 2012-09-12: 0.25 mg via SUBCUTANEOUS
  Filled 2012-09-12: qty 1

## 2012-09-12 MED ORDER — TERBUTALINE SULFATE 1 MG/ML IJ SOLN
0.2500 mg | Freq: Once | INTRAMUSCULAR | Status: DC
  Filled 2012-09-12: qty 1

## 2012-09-12 NOTE — MAU Note (Signed)
Patient is in with c/o vaginal bleeding since last night. She does not have a pad on. She states that she notices the dark blood when she wipes after voiding. She also states that she have irregular intermittent contractions. She denies lof. She reports good fetal movement.

## 2012-09-12 NOTE — MAU Provider Note (Signed)
History     CSN: [id number]  Arrival date and time: 09/12/12 1209   First Provider Initiated Contact with Patient 09/12/12 1309      Chief Complaint  Patient presents with  . Vaginal Bleeding  . Abdominal Cramping   HPI This is a 32 y.o. female at [redacted]w[redacted]d who presents with c/o contractions, back pain for 2-3 days. Had some bleeding after a BM last night at 0100.  No bleeding since, did not wear a pad. Good fetal movement. No leaking of fluid.  RN Note:  Patient is in with c/o vaginal bleeding since last night. She does not have a pad on. She states that she notices the dark blood when she wipes after voiding. She also states that she have irregular intermittent contractions. She denies lof. She reports good fetal movement.         OB History   Grav Para Term Preterm Abortions TAB SAB Ect Mult Living   3 2 2  0 0 0 0 0 0 2      Past Medical History  Diagnosis Date  . Hypertension   . UTI (urinary tract infection) during pregnancy     History reviewed. No pertinent past surgical history.  Family History  Problem Relation Age of Onset  . Anesthesia problems Neg Hx   . Other Neg Hx   . Hypertension Mother   . Asthma Sister   . Kidney disease Maternal Grandmother     History  Substance Use Topics  . Smoking status: Current Some Day Smoker -- 0.25 packs/day for 13 years    Types: Cigarettes  . Smokeless tobacco: Never Used  . Alcohol Use: No    Allergies: No Known Allergies  Prescriptions prior to admission  Medication Sig Dispense Refill  . acetaminophen-codeine (TYLENOL #3) 300-30 MG per tablet Take 1 tablet by mouth every 4 (four) hours as needed for pain.      . Prenatal Vit-Fe Fumarate-FA (PRENATAL MULTIVITAMIN) TABS Take 1 tablet by mouth every morning.        Review of Systems  Constitutional: Negative for fever, chills and malaise/fatigue.  Gastrointestinal: Positive for abdominal pain. Negative for nausea, vomiting, diarrhea and constipation.   Genitourinary: Negative for dysuria.  Musculoskeletal: Positive for back pain.  Neurological: Negative for headaches.   Physical Exam   Blood pressure 108/64, pulse 89, temperature 97.9 F (36.6 C), temperature source Oral, resp. rate 16, height 5\' 4"  (1.626 m), weight 139 lb 8 oz (63.277 kg), last menstrual period 03/02/2012.  Physical Exam  Constitutional: She is oriented to person, place, and time. She appears well-developed and well-nourished. No distress.  HENT:  Head: Normocephalic.  Cardiovascular: Normal rate.   Respiratory: Effort normal.  GI: Soft. She exhibits no distension and no mass. There is no tenderness. There is no rebound and no guarding.  Genitourinary: Uterus normal. Vaginal discharge (thin white) found.  Dilation: Closed Effacement (%): Thick Cervical Position: Posterior Exam by:: marie williams cnm  FFn collected  Musculoskeletal: Normal range of motion.  Neurological: She is alert and oriented to person, place, and time.  Skin: Skin is warm and dry.  Psychiatric: She has a normal mood and affect.   Results for orders placed during the hospital encounter of 09/12/12 (from the past 24 hour(s))  URINALYSIS, ROUTINE W REFLEX MICROSCOPIC     Status: Abnormal   Collection Time    09/12/12 12:00 PM      Result Value Range   Color, Urine YELLOW  YELLOW  APPearance CLEAR  CLEAR   Specific Gravity, Urine <1.005 (*) 1.005 - 1.030   pH 6.5  5.0 - 8.0   Glucose, UA NEGATIVE  NEGATIVE mg/dL   Hgb urine dipstick NEGATIVE  NEGATIVE   Bilirubin Urine NEGATIVE  NEGATIVE   Ketones, ur NEGATIVE  NEGATIVE mg/dL   Protein, ur NEGATIVE  NEGATIVE mg/dL   Urobilinogen, UA 0.2  0.0 - 1.0 mg/dL   Nitrite NEGATIVE  NEGATIVE   Leukocytes, UA MODERATE (*) NEGATIVE  URINE MICROSCOPIC-ADD ON     Status: None   Collection Time    09/12/12 12:00 PM      Result Value Range   Squamous Epithelial / LPF RARE  RARE   WBC, UA 3-6  <3 WBC/hpf   Bacteria, UA RARE  RARE  FETAL  FIBRONECTIN     Status: None   Collection Time    09/12/12  1:20 PM      Result Value Range   Fetal Fibronectin NEGATIVE  NEGATIVE    MAU Course  Procedures  MDM Discussed with Dr Gaynell Face:  Will send FFn and give Terbutaline for cramps. >> Cramps mostly stopped with Terb. Awaiting FFn result > Negative, but cramps continued (each lasts about 10 seconds).   Discussed with Dr Gaynell Face. Terbutaline and po hydration ordered >> pt refused second dose. Wants to go home  Assessment and Plan  A:  SIUP at [redacted]w[redacted]d       Preterm cramping without cervical change      Refuses further medication  P:  Discharge home      Strict PTL precautions      Pelvic rest      Follow up with Dr Raynelle Jan 09/12/2012, 1:23 PM

## 2012-10-07 ENCOUNTER — Encounter (HOSPITAL_COMMUNITY): Payer: Self-pay | Admitting: Family

## 2012-10-07 ENCOUNTER — Inpatient Hospital Stay (HOSPITAL_COMMUNITY)
Admission: AD | Admit: 2012-10-07 | Discharge: 2012-10-07 | Disposition: A | Discharge status: Home / Self Care | Payer: Medicaid Other | Source: Ambulatory Visit | Attending: Obstetrics | Admitting: Obstetrics

## 2012-10-07 DIAGNOSIS — O4700 False labor before 37 completed weeks of gestation, unspecified trimester: Secondary | ICD-10-CM | POA: Insufficient documentation

## 2012-10-07 DIAGNOSIS — O2342 Unspecified infection of urinary tract in pregnancy, second trimester: Secondary | ICD-10-CM

## 2012-10-07 DIAGNOSIS — A5901 Trichomonal vulvovaginitis: Secondary | ICD-10-CM

## 2012-10-07 DIAGNOSIS — N390 Urinary tract infection, site not specified: Secondary | ICD-10-CM | POA: Insufficient documentation

## 2012-10-07 DIAGNOSIS — O2390 Unspecified genitourinary tract infection in pregnancy, unspecified trimester: Secondary | ICD-10-CM | POA: Insufficient documentation

## 2012-10-07 LAB — URINALYSIS, ROUTINE W REFLEX MICROSCOPIC
Nitrite: NEGATIVE
Specific Gravity, Urine: 1.02 (ref 1.005–1.030)
pH: 7 (ref 5.0–8.0)

## 2012-10-07 LAB — URINE MICROSCOPIC-ADD ON

## 2012-10-07 LAB — WET PREP, GENITAL: Clue Cells Wet Prep HPF POC: NONE SEEN

## 2012-10-07 MED ORDER — NITROFURANTOIN MONOHYD MACRO 100 MG PO CAPS: 100.0000 mg | ORAL_CAPSULE | Freq: Two times a day (BID) | ORAL | Status: AC

## 2012-10-07 MED ORDER — METRONIDAZOLE 500 MG PO TABS
2000.0000 mg | ORAL_TABLET | Freq: Once | ORAL | Status: AC
  Administered 2012-10-07: 2000 mg via ORAL
  Filled 2012-10-07: qty 4

## 2012-10-07 NOTE — MAU Provider Note (Signed)
History     CSN: [id number]  Arrival date and time: 10/07/12 0901   First Provider Initiated Contact with Patient 10/07/12 1000      Chief Complaint  Patient presents with  . Labor Eval   HPI 32 y.o. [id number] at [redacted]w[redacted]d with c/o contractions. Reports "tightening" hourly, some low abd pain, no bleeding or LOF. + fetal movement.    Past Medical History  Diagnosis Date  . Hypertension   . UTI (urinary tract infection) during pregnancy     Past Surgical History  Procedure Laterality Date  . Wisdom tooth extraction      Family History  Problem Relation Age of Onset  . Anesthesia problems Neg Hx   . Other Neg Hx   . Hypertension Mother   . Asthma Sister   . Kidney disease Maternal Grandmother     History  Substance Use Topics  . Smoking status: Current Some Day Smoker -- 0.25 packs/day for 13 years    Types: Cigarettes  . Smokeless tobacco: Never Used  . Alcohol Use: No    Allergies: No Known Allergies  Prescriptions prior to admission  Medication Sig Dispense Refill  . acetaminophen-codeine (TYLENOL #3) 300-30 MG per tablet Take 1 tablet by mouth every 4 (four) hours as needed for pain.      . Prenatal Vit-Fe Fumarate-FA (PRENATAL MULTIVITAMIN) TABS Take 1 tablet by mouth every morning.        Review of Systems  Constitutional: Negative.   Respiratory: Negative.   Cardiovascular: Negative.   Gastrointestinal: Negative for nausea, vomiting, abdominal pain, diarrhea and constipation.  Genitourinary: Negative for dysuria, urgency, frequency, hematuria and flank pain.       Negative for vaginal bleeding, + cramping/contractions  Musculoskeletal: Negative.   Neurological: Negative.   Psychiatric/Behavioral: Negative.    Physical Exam   Blood pressure 114/67, pulse 90, temperature 97.9 F (36.6 C), temperature source Oral, resp. rate 18, height 5\' 4"  (1.626 m), weight 141 lb (63.957 kg), last menstrual period 03/02/2012.  Physical Exam  Nursing note and vitals  reviewed. Constitutional: She is oriented to person, place, and time. She appears well-developed and well-nourished. No distress.  HENT:  Head: Normocephalic and atraumatic.  Cardiovascular: Normal rate.   Respiratory: Effort normal.  GI: Soft. Bowel sounds are normal. She exhibits no mass. There is no tenderness. There is no rebound and no guarding.  Genitourinary: There is no rash or lesion on the right labia. There is no rash or lesion on the left labia. Uterus is not tender. Enlarged: Size c/w dates. Cervix exhibits friability. Cervix exhibits no motion tenderness. No tenderness or bleeding around the vagina. Vaginal discharge (foamy, yellow discharge) found.  SVE: multiparous, internal os closed/thick/high  Musculoskeletal: Normal range of motion.  Neurological: She is alert and oriented to person, place, and time.  Skin: Skin is warm and dry.  Psychiatric: She has a normal mood and affect.    MAU Course  Procedures Results for orders placed during the hospital encounter of 10/07/12 (from the past 24 hour(s))  URINALYSIS, ROUTINE W REFLEX MICROSCOPIC     Status: Abnormal   Collection Time    10/07/12  9:10 AM      Result Value Range   Color, Urine YELLOW  YELLOW   APPearance CLOUDY (*) CLEAR   Specific Gravity, Urine 1.020  1.005 - 1.030   pH 7.0  5.0 - 8.0   Glucose, UA NEGATIVE  NEGATIVE mg/dL   Hgb urine dipstick LARGE (*) NEGATIVE  Bilirubin Urine NEGATIVE  NEGATIVE   Ketones, ur NEGATIVE  NEGATIVE mg/dL   Protein, ur 098 (*) NEGATIVE mg/dL   Urobilinogen, UA 1.0  0.0 - 1.0 mg/dL   Nitrite NEGATIVE  NEGATIVE   Leukocytes, UA LARGE (*) NEGATIVE  URINE MICROSCOPIC-ADD ON     Status: Abnormal   Collection Time    10/07/12  9:10 AM      Result Value Range   Squamous Epithelial / LPF MANY (*) RARE   WBC, UA TOO NUMEROUS TO COUNT  <3 WBC/hpf   RBC / HPF 21-50  <3 RBC/hpf   Bacteria, UA MANY (*) RARE   Urine-Other MUCOUS PRESENT    WET PREP, GENITAL     Status: Abnormal    Collection Time    10/07/12  9:48 AM      Result Value Range   Yeast Wet Prep HPF POC NONE SEEN  NONE SEEN   Trich, Wet Prep FEW (*) NONE SEEN   Clue Cells Wet Prep HPF POC NONE SEEN  NONE SEEN   WBC, Wet Prep HPF POC TOO NUMEROUS TO COUNT (*) NONE SEEN   Flagyl 2000 mg po in MAU for trich  Assessment and Plan   1. Trichomonal vaginitis in pregnancy, second trimester   2. UTI in pregnancy, antepartum, second trimester   Macrobid for UTI, urine culture pending, Flagyl for trich today, rev'd partner treatment and abstinence until 1 week after both treated    Medication List    TAKE these medications       acetaminophen-codeine 300-30 MG per tablet  Commonly known as:  TYLENOL #3  Take 1 tablet by mouth every 4 (four) hours as needed for pain.     nitrofurantoin (macrocrystal-monohydrate) 100 MG capsule  Commonly known as:  MACROBID  Take 1 capsule (100 mg total) by mouth 2 (two) times daily.     prenatal multivitamin Tabs  Take 1 tablet by mouth every morning.            Follow-up Information   Follow up with MARSHALL,BERNARD A, MD. (as scheduled)    Contact information:   [address] [place] [address] [phone]         [facility] 10/07/2012, 10:16 AM

## 2012-10-07 NOTE — MAU Note (Signed)
Patient presents to MAU with c/o bilateral lower abdominal pain x 2 days; reports worsening s/s today. Denies vaginal bleeding, LOF or cramping. Reports + fetal movement.

## 2012-10-07 NOTE — MAU Note (Signed)
Pt presents with complaints of abdominal pain that started yesterday but has  got worse this am Denies any leakage of fluid or bleeding

## 2012-10-08 LAB — URINE CULTURE: Colony Count: 8000

## 2012-10-09 LAB — OB RESULTS CONSOLE GBS: GBS: NEGATIVE

## 2012-10-14 ENCOUNTER — Encounter (HOSPITAL_COMMUNITY): Payer: Self-pay | Admitting: *Deleted

## 2012-10-14 ENCOUNTER — Inpatient Hospital Stay (HOSPITAL_COMMUNITY)
Admission: AD | Admit: 2012-10-14 | Discharge: 2012-10-14 | Disposition: A | Discharge status: Home / Self Care | Payer: Medicaid Other | Source: Ambulatory Visit | Attending: Obstetrics | Admitting: Obstetrics

## 2012-10-14 DIAGNOSIS — N390 Urinary tract infection, site not specified: Secondary | ICD-10-CM | POA: Insufficient documentation

## 2012-10-14 DIAGNOSIS — M549 Dorsalgia, unspecified: Secondary | ICD-10-CM | POA: Insufficient documentation

## 2012-10-14 DIAGNOSIS — O2343 Unspecified infection of urinary tract in pregnancy, third trimester: Secondary | ICD-10-CM

## 2012-10-14 DIAGNOSIS — R109 Unspecified abdominal pain: Secondary | ICD-10-CM | POA: Insufficient documentation

## 2012-10-14 DIAGNOSIS — N949 Unspecified condition associated with female genital organs and menstrual cycle: Secondary | ICD-10-CM | POA: Insufficient documentation

## 2012-10-14 DIAGNOSIS — O2390 Unspecified genitourinary tract infection in pregnancy, unspecified trimester: Secondary | ICD-10-CM | POA: Insufficient documentation

## 2012-10-14 DIAGNOSIS — O4700 False labor before 37 completed weeks of gestation, unspecified trimester: Secondary | ICD-10-CM | POA: Insufficient documentation

## 2012-10-14 HISTORY — DX: Trichomoniasis, unspecified: A59.9

## 2012-10-14 LAB — URINE MICROSCOPIC-ADD ON

## 2012-10-14 LAB — URINALYSIS, ROUTINE W REFLEX MICROSCOPIC
Bilirubin Urine: NEGATIVE
Hgb urine dipstick: NEGATIVE
Protein, ur: NEGATIVE mg/dL
Specific Gravity, Urine: 1.01 (ref 1.005–1.030)
Urobilinogen, UA: 0.2 mg/dL (ref 0.0–1.0)

## 2012-10-14 MED ORDER — NIFEDIPINE 10 MG PO CAPS
10.0000 mg | ORAL_CAPSULE | ORAL | Status: AC
  Administered 2012-10-14: 10 mg via ORAL
  Filled 2012-10-14: qty 1

## 2012-10-14 NOTE — MAU Provider Note (Signed)
History     CSN: [id number]  Arrival date and time: 10/14/12 4098   First Provider Initiated Contact with Patient 10/14/12 0119      Chief Complaint  Patient presents with  . Back Pain  . Abdominal Pain  . Vaginal Pain   HPI  Pt is a G3P2002 at 30.6 wks IUP here with report of contractions that started at 2200 last night.  No report of UTI symptoms, vaginal bleeding or leaking of fluid.  Also reports constant lower back pain.    Past Medical History  Diagnosis Date  . Hypertension   . UTI (urinary tract infection) during pregnancy   . Trichomoniasis     Past Surgical History  Procedure Laterality Date  . Wisdom tooth extraction      Family History  Problem Relation Age of Onset  . Anesthesia problems Neg Hx   . Other Neg Hx   . Hypertension Mother   . Asthma Sister   . Kidney disease Maternal Grandmother     History  Substance Use Topics  . Smoking status: Current Some Day Smoker -- 0.25 packs/day for 13 years    Types: Cigarettes  . Smokeless tobacco: Never Used  . Alcohol Use: No    Allergies: No Known Allergies  Prescriptions prior to admission  Medication Sig Dispense Refill  . acetaminophen-codeine (TYLENOL #3) 300-30 MG per tablet Take 1 tablet by mouth every 4 (four) hours as needed for pain.      . nitrofurantoin, macrocrystal-monohydrate, (MACROBID) 100 MG capsule Take 1 capsule (100 mg total) by mouth 2 (two) times daily.  14 capsule  0  . Prenatal Vit-Fe Fumarate-FA (PRENATAL MULTIVITAMIN) TABS Take 1 tablet by mouth every morning.        Review of Systems  Gastrointestinal: Positive for abdominal pain (contractions).  Musculoskeletal: Positive for back pain.  All other systems reviewed and are negative.   Physical Exam   Blood pressure 131/74, pulse 98, temperature 98.1 F (36.7 C), resp. rate 18, height 5\' 4"  (1.626 m), weight 64.683 kg (142 lb 9.6 oz), last menstrual period 03/02/2012, SpO2 100.00%.  Physical Exam  Constitutional: She  is oriented to person, place, and time. She appears well-developed and well-nourished.  HENT:  Head: Normocephalic.  Neck: Normal range of motion. Neck supple.  Cardiovascular: Normal rate, regular rhythm and normal heart sounds.   Respiratory: Effort normal and breath sounds normal.  GI: Soft. There is no tenderness.  Genitourinary: No bleeding around the vagina. Vaginal discharge (mucusy) found.  Neurological: She is alert and oriented to person, place, and time.  Skin: Skin is warm and dry.   Dilation: Closed (External 3 Internal Os 0) Station: -3 Exam by:: Jerolyn Center CNM   MAU Course  Procedures  Results for orders placed during the hospital encounter of 10/14/12 (from the past 24 hour(s))  URINALYSIS, ROUTINE W REFLEX MICROSCOPIC     Status: Abnormal   Collection Time    10/14/12 12:57 AM      Result Value Range   Color, Urine YELLOW  YELLOW   APPearance CLEAR  CLEAR   Specific Gravity, Urine 1.010  1.005 - 1.030   pH 6.0  5.0 - 8.0   Glucose, UA NEGATIVE  NEGATIVE mg/dL   Hgb urine dipstick NEGATIVE  NEGATIVE   Bilirubin Urine NEGATIVE  NEGATIVE   Ketones, ur NEGATIVE  NEGATIVE mg/dL   Protein, ur NEGATIVE  NEGATIVE mg/dL   Urobilinogen, UA 0.2  0.0 - 1.0 mg/dL   Nitrite  NEGATIVE  NEGATIVE   Leukocytes, UA MODERATE (*) NEGATIVE  URINE MICROSCOPIC-ADD ON     Status: Abnormal   Collection Time    10/14/12 12:57 AM      Result Value Range   Squamous Epithelial / LPF FEW (*) RARE   WBC, UA 7-10  <3 WBC/hpf   RBC / HPF 3-6  <3 RBC/hpf   Bacteria, UA FEW (*) RARE  FETAL FIBRONECTIN     Status: None   Collection Time    10/14/12  1:35 AM      Result Value Range   Fetal Fibronectin NEGATIVE  NEGATIVE   Consulted with Dr. Tamela Oddi > reviewed HPI/exam/cervical check/contraction pattern and FFN result > discharge home with preterm labor precautions  Assessment and Plan  UTI Preterm Contractions  Plan: DC to home Continue Macrobid Urine culture Follow-up next  week Message sent to Dr. Gaynell Face regarding [facility] 10/14/2012, 1:21 AM

## 2012-10-14 NOTE — MAU Note (Signed)
Pain in back, esp when turn in bed. Lower abd. Pain and pelvic pressure.

## 2012-10-15 LAB — URINE CULTURE

## 2012-10-18 ENCOUNTER — Encounter (HOSPITAL_COMMUNITY): Payer: Self-pay | Admitting: *Deleted

## 2012-10-18 ENCOUNTER — Inpatient Hospital Stay (HOSPITAL_COMMUNITY)
Admission: AD | Admit: 2012-10-18 | Discharge: 2012-10-18 | Disposition: A | Discharge status: Home / Self Care | Payer: Medicaid Other | Source: Ambulatory Visit | Attending: Obstetrics | Admitting: Obstetrics

## 2012-10-18 DIAGNOSIS — N390 Urinary tract infection, site not specified: Secondary | ICD-10-CM

## 2012-10-18 DIAGNOSIS — O4700 False labor before 37 completed weeks of gestation, unspecified trimester: Secondary | ICD-10-CM | POA: Insufficient documentation

## 2012-10-18 DIAGNOSIS — O2390 Unspecified genitourinary tract infection in pregnancy, unspecified trimester: Secondary | ICD-10-CM | POA: Insufficient documentation

## 2012-10-18 DIAGNOSIS — R109 Unspecified abdominal pain: Secondary | ICD-10-CM | POA: Insufficient documentation

## 2012-10-18 LAB — URINALYSIS, ROUTINE W REFLEX MICROSCOPIC
Glucose, UA: NEGATIVE mg/dL
Hgb urine dipstick: NEGATIVE
Protein, ur: NEGATIVE mg/dL
pH: 7.5 (ref 5.0–8.0)

## 2012-10-18 LAB — URINE MICROSCOPIC-ADD ON

## 2012-10-18 MED ORDER — CEPHALEXIN 500 MG PO CAPS: 500.0000 mg | ORAL_CAPSULE | Freq: Four times a day (QID) | ORAL | Status: DC

## 2012-10-18 NOTE — MAU Note (Addendum)
abd cramping started last night, still cramping this morning.  C/o pelvic pressure.  No bleeding or leaking.  Denies hx of PTL.  Was once cm last wk.

## 2012-10-18 NOTE — MAU Provider Note (Signed)
History     CSN: [id number]  Arrival date and time: 10/18/12 1201   First Provider Initiated Contact with Patient 10/18/12 1249      Chief Complaint  Patient presents with  . Abdominal Cramping   HPI [name] is a 32 y.o. G3P2002 at [redacted]w[redacted]d who presents to MAU today with complaint of cramping since last night. She is having occasional contractions. She was 1 cm dilated in the office last week. She denies LOF, bleeding or discharge. She is having increased lower abdominal pressure. She is also complaining of dysuria. She denies complications with this pregnancy. She reports good fetal movement.   OB History   Grav Para Term Preterm Abortions TAB SAB Ect Mult Living   3 2 2  0 0 0 0 0 0 2      Past Medical History  Diagnosis Date  . Hypertension   . UTI (urinary tract infection) during pregnancy   . Trichomoniasis     Past Surgical History  Procedure Laterality Date  . Wisdom tooth extraction      Family History  Problem Relation Age of Onset  . Anesthesia problems Neg Hx   . Other Neg Hx   . Hypertension Mother   . Asthma Sister   . Kidney disease Maternal Grandmother     History  Substance Use Topics  . Smoking status: Current Some Day Smoker -- 0.25 packs/day for 13 years    Types: Cigarettes  . Smokeless tobacco: Never Used  . Alcohol Use: No    Allergies: No Known Allergies  Prescriptions prior to admission  Medication Sig Dispense Refill  . acetaminophen-codeine (TYLENOL #3) 300-30 MG per tablet Take 1 tablet by mouth every 4 (four) hours as needed for pain.      . Prenatal Vit-Fe Fumarate-FA (PRENATAL MULTIVITAMIN) TABS Take 1 tablet by mouth every morning.        Review of Systems  Gastrointestinal: Positive for abdominal pain. Negative for nausea and vomiting.  Genitourinary: Positive for dysuria. Negative for urgency and frequency.       Neg - Vaginal bleeding, discharge, LOF   Physical Exam   Blood pressure 120/69, pulse 82, temperature  97.9 F (36.6 C), temperature source Oral, resp. rate 16, height 5\' 4"  (1.626 m), weight 140 lb (63.504 kg), last menstrual period 03/02/2012.  Physical Exam  Constitutional: She is oriented to person, place, and time. She appears well-developed and well-nourished. No distress.  HENT:  Head: Normocephalic and atraumatic.  Cardiovascular: Normal rate, regular rhythm and normal heart sounds.   Respiratory: Effort normal and breath sounds normal. No respiratory distress.  GI: Soft. Bowel sounds are normal. She exhibits no distension and no mass. There is no tenderness. There is no rebound and no guarding.  Neurological: She is alert and oriented to person, place, and time.  Skin: Skin is warm and dry. No erythema.  Psychiatric: She has a normal mood and affect.  Dilation: 1.5 Effacement (%): 30 Cervical Position: Middle Station: Ballotable Exam by:: Naaman Plummer PA   Results for orders placed during the hospital encounter of 10/18/12 (from the past 24 hour(s))  URINALYSIS, ROUTINE W REFLEX MICROSCOPIC     Status: Abnormal   Collection Time    10/18/12 12:10 PM      Result Value Range   Color, Urine YELLOW  YELLOW   APPearance HAZY (*) CLEAR   Specific Gravity, Urine 1.015  1.005 - 1.030   pH 7.5  5.0 - 8.0   Glucose,  UA NEGATIVE  NEGATIVE mg/dL   Hgb urine dipstick NEGATIVE  NEGATIVE   Bilirubin Urine NEGATIVE  NEGATIVE   Ketones, ur NEGATIVE  NEGATIVE mg/dL   Protein, ur NEGATIVE  NEGATIVE mg/dL   Urobilinogen, UA 0.2  0.0 - 1.0 mg/dL   Nitrite NEGATIVE  NEGATIVE   Leukocytes, UA LARGE (*) NEGATIVE  URINE MICROSCOPIC-ADD ON     Status: Abnormal   Collection Time    10/18/12 12:10 PM      Result Value Range   Squamous Epithelial / LPF FEW (*) RARE   WBC, UA 7-10  <3 WBC/hpf   RBC / HPF 0-2  <3 RBC/hpf   Bacteria, UA FEW (*) RARE   Fetal Monitoring: Baseline: 130 bpm, moderate variability, + accelerations, no decelerations Contractions: occasional  MAU Course   Procedures None  MDM UA today - will treat for UTI as patient is symptomatic Cervix shows minimal change from patient report of last week. No bleeding, LOF or discharge.   Assessment and Plan  A: UTI in pregnancy Braxton-Hicks contractions  P: Discharge home Rx for Kelfex sent to patient's pharmacy Patient encouraged to increase PO hydration as tolerated Patient advised to keep next appointment with Dr. Gaynell Face as scheduled Patient may return to MAU as needed  Freddi Starr, PA-C  10/18/2012, 12:49 PM

## 2012-10-18 NOTE — MAU Note (Signed)
C/o ? Labor since last night around @ 1930;

## 2012-10-19 LAB — URINE CULTURE: Colony Count: 9000

## 2012-11-14 ENCOUNTER — Encounter (HOSPITAL_COMMUNITY): Payer: Self-pay | Admitting: Anesthesiology

## 2012-11-14 ENCOUNTER — Inpatient Hospital Stay (HOSPITAL_COMMUNITY)
Admission: RE | Admit: 2012-11-14 | Discharge: 2012-11-17 | DRG: 775 | Disposition: A | Discharge status: Home / Self Care | Payer: Medicaid Other | Source: Ambulatory Visit | Attending: Obstetrics | Admitting: Obstetrics

## 2012-11-14 ENCOUNTER — Encounter (HOSPITAL_COMMUNITY): Payer: Self-pay

## 2012-11-14 ENCOUNTER — Inpatient Hospital Stay (HOSPITAL_COMMUNITY): Payer: Medicaid Other | Admitting: Anesthesiology

## 2012-11-14 DIAGNOSIS — R030 Elevated blood-pressure reading, without diagnosis of hypertension: Secondary | ICD-10-CM | POA: Diagnosis not present

## 2012-11-14 DIAGNOSIS — O99893 Other specified diseases and conditions complicating puerperium: Principal | ICD-10-CM | POA: Diagnosis not present

## 2012-11-14 DIAGNOSIS — O139 Gestational [pregnancy-induced] hypertension without significant proteinuria, unspecified trimester: Secondary | ICD-10-CM | POA: Diagnosis not present

## 2012-11-14 LAB — CBC
HCT: 30.5 % — ABNORMAL LOW (ref 36.0–46.0)
MCHC: 35.1 g/dL (ref 30.0–36.0)
MCV: 85.7 fL (ref 78.0–100.0)
Platelets: 243 10*3/uL (ref 150–400)
RDW: 13.4 % (ref 11.5–15.5)
WBC: 8.9 10*3/uL (ref 4.0–10.5)

## 2012-11-14 MED ORDER — ACETAMINOPHEN 325 MG PO TABS: 650.0000 mg | ORAL_TABLET | ORAL | Status: DC | PRN

## 2012-11-14 MED ORDER — PRENATAL MULTIVITAMIN CH
1.0000 | ORAL_TABLET | Freq: Every day | ORAL | Status: DC
  Administered 2012-11-14 – 2012-11-17 (×4): 1 via ORAL
  Filled 2012-11-14 (×4): qty 1

## 2012-11-14 MED ORDER — TETANUS-DIPHTH-ACELL PERTUSSIS 5-2.5-18.5 LF-MCG/0.5 IM SUSP
0.5000 mL | Freq: Once | INTRAMUSCULAR | Status: AC
  Administered 2012-11-15: 0.5 mL via INTRAMUSCULAR
  Filled 2012-11-14: qty 0.5

## 2012-11-14 MED ORDER — DIPHENHYDRAMINE HCL 50 MG/ML IJ SOLN: 12.5000 mg | INTRAMUSCULAR | Status: DC | PRN

## 2012-11-14 MED ORDER — DIBUCAINE 1 % RE OINT: 1.0000 "application " | TOPICAL_OINTMENT | RECTAL | Status: DC | PRN

## 2012-11-14 MED ORDER — LANOLIN HYDROUS EX OINT: TOPICAL_OINTMENT | CUTANEOUS | Status: DC | PRN

## 2012-11-14 MED ORDER — FENTANYL 2.5 MCG/ML BUPIVACAINE 1/10 % EPIDURAL INFUSION (WH - ANES)
14.0000 mL/h | INTRAMUSCULAR | Status: DC | PRN
  Administered 2012-11-14: 14 mL/h via EPIDURAL
  Filled 2012-11-14: qty 125

## 2012-11-14 MED ORDER — IBUPROFEN 600 MG PO TABS
600.0000 mg | ORAL_TABLET | Freq: Four times a day (QID) | ORAL | Status: DC | PRN
  Administered 2012-11-14: 600 mg via ORAL
  Filled 2012-11-14 (×6): qty 1

## 2012-11-14 MED ORDER — OXYCODONE-ACETAMINOPHEN 5-325 MG PO TABS
1.0000 | ORAL_TABLET | ORAL | Status: DC | PRN
  Administered 2012-11-14 – 2012-11-15 (×3): 1 via ORAL
  Filled 2012-11-14 (×4): qty 1

## 2012-11-14 MED ORDER — SIMETHICONE 80 MG PO CHEW: 80.0000 mg | CHEWABLE_TABLET | ORAL | Status: DC | PRN

## 2012-11-14 MED ORDER — EPHEDRINE 5 MG/ML INJ
10.0000 mg | INTRAVENOUS | Status: DC | PRN
  Filled 2012-11-14: qty 2

## 2012-11-14 MED ORDER — OXYTOCIN 40 UNITS IN LACTATED RINGERS INFUSION - SIMPLE MED
INTRAVENOUS | Status: AC
  Filled 2012-11-14: qty 1000

## 2012-11-14 MED ORDER — IBUPROFEN 600 MG PO TABS
600.0000 mg | ORAL_TABLET | Freq: Four times a day (QID) | ORAL | Status: DC
  Administered 2012-11-14 – 2012-11-17 (×11): 600 mg via ORAL
  Filled 2012-11-14 (×7): qty 1

## 2012-11-14 MED ORDER — PHENYLEPHRINE 40 MCG/ML (10ML) SYRINGE FOR IV PUSH (FOR BLOOD PRESSURE SUPPORT)
80.0000 ug | PREFILLED_SYRINGE | INTRAVENOUS | Status: DC | PRN
  Filled 2012-11-14: qty 2
  Filled 2012-11-14: qty 5

## 2012-11-14 MED ORDER — LIDOCAINE HCL (PF) 1 % IJ SOLN
INTRAMUSCULAR | Status: DC | PRN
  Administered 2012-11-14 (×4): 4 mL

## 2012-11-14 MED ORDER — ONDANSETRON HCL 4 MG/2ML IJ SOLN: 4.0000 mg | Freq: Four times a day (QID) | INTRAMUSCULAR | Status: DC | PRN

## 2012-11-14 MED ORDER — BUTORPHANOL TARTRATE 1 MG/ML IJ SOLN: 1.0000 mg | INTRAMUSCULAR | Status: DC | PRN

## 2012-11-14 MED ORDER — OXYTOCIN BOLUS FROM INFUSION: 500.0000 mL | INTRAVENOUS | Status: DC

## 2012-11-14 MED ORDER — EPHEDRINE 5 MG/ML INJ
10.0000 mg | INTRAVENOUS | Status: DC | PRN
  Filled 2012-11-14: qty 2
  Filled 2012-11-14: qty 4

## 2012-11-14 MED ORDER — LACTATED RINGERS IV SOLN: 500.0000 mL | INTRAVENOUS | Status: DC | PRN

## 2012-11-14 MED ORDER — CITRIC ACID-SODIUM CITRATE 334-500 MG/5ML PO SOLN: 30.0000 mL | ORAL | Status: DC | PRN

## 2012-11-14 MED ORDER — ONDANSETRON HCL 4 MG PO TABS: 4.0000 mg | ORAL_TABLET | ORAL | Status: DC | PRN

## 2012-11-14 MED ORDER — LIDOCAINE HCL (PF) 1 % IJ SOLN
30.0000 mL | INTRAMUSCULAR | Status: DC | PRN
  Filled 2012-11-14 (×2): qty 30

## 2012-11-14 MED ORDER — SENNOSIDES-DOCUSATE SODIUM 8.6-50 MG PO TABS
2.0000 | ORAL_TABLET | Freq: Every day | ORAL | Status: DC
  Administered 2012-11-16: 2 via ORAL

## 2012-11-14 MED ORDER — WITCH HAZEL-GLYCERIN EX PADS: 1.0000 "application " | MEDICATED_PAD | CUTANEOUS | Status: DC | PRN

## 2012-11-14 MED ORDER — ZOLPIDEM TARTRATE 5 MG PO TABS: 5.0000 mg | ORAL_TABLET | Freq: Every evening | ORAL | Status: DC | PRN

## 2012-11-14 MED ORDER — ONDANSETRON HCL 4 MG/2ML IJ SOLN: 4.0000 mg | INTRAMUSCULAR | Status: DC | PRN

## 2012-11-14 MED ORDER — BENZOCAINE-MENTHOL 20-0.5 % EX AERO: 1.0000 "application " | INHALATION_SPRAY | CUTANEOUS | Status: DC | PRN

## 2012-11-14 MED ORDER — OXYTOCIN 40 UNITS IN LACTATED RINGERS INFUSION - SIMPLE MED
1.0000 m[IU]/min | INTRAVENOUS | Status: DC
  Administered 2012-11-14: 2 m[IU]/min via INTRAVENOUS

## 2012-11-14 MED ORDER — PHENYLEPHRINE 40 MCG/ML (10ML) SYRINGE FOR IV PUSH (FOR BLOOD PRESSURE SUPPORT)
80.0000 ug | PREFILLED_SYRINGE | INTRAVENOUS | Status: DC | PRN
  Filled 2012-11-14: qty 2

## 2012-11-14 MED ORDER — TERBUTALINE SULFATE 1 MG/ML IJ SOLN: 0.2500 mg | Freq: Once | INTRAMUSCULAR | Status: DC | PRN

## 2012-11-14 MED ORDER — LACTATED RINGERS IV SOLN
500.0000 mL | Freq: Once | INTRAVENOUS | Status: AC
Start: 2012-11-14 — End: 2012-11-14
  Administered 2012-11-14: 500 mL via INTRAVENOUS

## 2012-11-14 MED ORDER — DIPHENHYDRAMINE HCL 25 MG PO CAPS: 25.0000 mg | ORAL_CAPSULE | Freq: Four times a day (QID) | ORAL | Status: DC | PRN

## 2012-11-14 MED ORDER — FERROUS SULFATE 325 (65 FE) MG PO TABS
325.0000 mg | ORAL_TABLET | Freq: Two times a day (BID) | ORAL | Status: DC
  Administered 2012-11-14 – 2012-11-17 (×6): 325 mg via ORAL
  Filled 2012-11-14 (×6): qty 1

## 2012-11-14 MED ORDER — OXYTOCIN 40 UNITS IN LACTATED RINGERS INFUSION - SIMPLE MED
62.5000 mL/h | INTRAVENOUS | Status: DC
  Administered 2012-11-14: 62.5 mL/h via INTRAVENOUS

## 2012-11-14 MED ORDER — OXYCODONE-ACETAMINOPHEN 5-325 MG PO TABS
1.0000 | ORAL_TABLET | ORAL | Status: DC | PRN
  Administered 2012-11-16: 1 via ORAL

## 2012-11-14 MED ORDER — LACTATED RINGERS IV SOLN
INTRAVENOUS | Status: DC
  Administered 2012-11-14: 07:00:00 via INTRAVENOUS

## 2012-11-14 NOTE — Anesthesia Postprocedure Evaluation (Signed)
Anesthesia Post Note  Patient: Andrea Stone  Procedure(s) Performed: * No procedures listed *  Anesthesia type: Epidural  Patient location: Mother/Baby  Post pain: Pain level controlled  Post assessment: Post-op Vital signs reviewed  Last Vitals:  Filed Vitals:   11/14/12 1522  BP: 120/79  Pulse: 69  Temp:   Resp: 18    Post vital signs: Reviewed  Level of consciousness:alert  Complications: No apparent anesthesia complications

## 2012-11-14 NOTE — Anesthesia Procedure Notes (Signed)
Epidural Patient location during procedure: OB Start time: 11/14/2012 9:55 AM  Staffing Performed by: anesthesiologist   Preanesthetic Checklist Completed: patient identified, site marked, surgical consent, pre-op evaluation, timeout performed, IV checked, risks and benefits discussed and monitors and equipment checked  Epidural Patient position: sitting Prep: site prepped and draped and DuraPrep Patient monitoring: continuous pulse ox and blood pressure Approach: midline Injection technique: LOR air  Needle:  Needle type: Tuohy  Needle gauge: 17 G Needle length: 9 cm and 9 Needle insertion depth: 4.5 cm Catheter type: closed end flexible Catheter size: 19 Gauge Catheter at skin depth: 9.5 cm Test dose: negative  Assessment Events: blood not aspirated, injection not painful, no injection resistance, negative IV test and no paresthesia  Additional Notes Discussed risk of headache, infection, bleeding, nerve injury and failed or incomplete block.  Patient voices understanding and wishes to proceed.  Epidural placed easily on first attempt.  No paresthesia.  Patient tolerated procedure well with no apparent complications.  Jasmine December, MD Reason for block:procedure for pain

## 2012-11-14 NOTE — H&P (Signed)
This is Dr. Francoise Ceo dictating the history and physical on Andrea Stone she is a 32 year old gravida 3 para 202 at 64 weeks and 1 day EDC 11/20/2012 negative GBS desires induction her cervix is 2 cm 80% vertex -3 amniotomy performed fluids clear is got an epidural and is on low-dose Pitocin Past medical history negative Past surgical history negative Social history negative System review noncontributory Physical exam well-developed female in labor HEENT negative Breasts negative Lungs clear to P&A Heart regular rhythm no murmurs no gallops Abdomen term Pelvic as described above Extremities negative and

## 2012-11-14 NOTE — Progress Notes (Signed)
Hugs tag#[id number] placed on infant. ID bands with # [id number] placed on infant and mom. Both bands placed on mom.

## 2012-11-14 NOTE — Anesthesia Preprocedure Evaluation (Signed)
Anesthesia Evaluation  Patient identified by MRN, date of birth, ID band Patient awake    Reviewed: Allergy & Precautions, H&P , NPO status , Patient's Chart, lab work & pertinent test results, reviewed documented beta blocker date and time   History of Anesthesia Complications Negative for: history of anesthetic complications  Airway Mallampati: I TM Distance: >3 FB Neck ROM: full    Dental  (+) Teeth Intact   Pulmonary Current Smoker,  breath sounds clear to auscultation        Cardiovascular negative cardio ROS  Rhythm:regular Rate:Normal     Neuro/Psych negative neurological ROS  negative psych ROS   GI/Hepatic negative GI ROS, Neg liver ROS,   Endo/Other  negative endocrine ROS  Renal/GU negative Renal ROS  negative genitourinary   Musculoskeletal   Abdominal   Peds  Hematology  (+) anemia ,   Anesthesia Other Findings   Reproductive/Obstetrics (+) Pregnancy                           Anesthesia Physical Anesthesia Plan  ASA: II  Anesthesia Plan: Epidural   Post-op Pain Management:    Induction:   Airway Management Planned:   Additional Equipment:   Intra-op Plan:   Post-operative Plan:   Informed Consent: I have reviewed the patients History and Physical, chart, labs and discussed the procedure including the risks, benefits and alternatives for the proposed anesthesia with the patient or authorized representative who has indicated his/her understanding and acceptance.     Plan Discussed with:   Anesthesia Plan Comments:         Anesthesia Quick Evaluation

## 2012-11-14 NOTE — Progress Notes (Signed)
Delivery of live viable female by Dr Gaynell Face. APGARs 9,9

## 2012-11-15 LAB — CBC
HCT: 31.7 % — ABNORMAL LOW (ref 36.0–46.0)
Hemoglobin: 11 g/dL — ABNORMAL LOW (ref 12.0–15.0)
MCH: 29.8 pg (ref 26.0–34.0)
MCHC: 34.7 g/dL (ref 30.0–36.0)

## 2012-11-15 NOTE — Progress Notes (Signed)
Patient ID: Andrea Stone, female   DOB: 16-Aug-1980, 32 y.o.   MRN: [id number] Postpartum day one Vital signs normal Fundus firm Lochia moderate Legs negative doing well

## 2012-11-15 NOTE — Progress Notes (Signed)
UR chart review completed.  

## 2012-11-16 LAB — COMPREHENSIVE METABOLIC PANEL
BUN: 5 mg/dL — ABNORMAL LOW (ref 6–23)
Calcium: 9 mg/dL (ref 8.4–10.5)
GFR calc Af Amer: >90 mL/min (ref >90)
Glucose, Bld: 110 mg/dL — ABNORMAL HIGH (ref 70–99)
Total Protein: 6.2 g/dL (ref 6.0–8.3)

## 2012-11-16 LAB — PROTEIN, URINE, RANDOM: Total Protein, Urine: 4 mg/dL

## 2012-11-16 MED ORDER — LABETALOL HCL 200 MG PO TABS
200.0000 mg | ORAL_TABLET | Freq: Three times a day (TID) | ORAL | Status: DC
  Administered 2012-11-16 – 2012-11-17 (×4): 200 mg via ORAL
  Filled 2012-11-16 (×4): qty 1

## 2012-11-16 NOTE — Progress Notes (Signed)
Patient ID: Andrea Stone, female   DOB: 25-Mar-1981, 32 y.o.   MRN: [id number] Postpartum day 2 Discharge canceled because of elevated blood pressures so she was started on labetalol 200 mg every 8 hours on [facility] labs ordered

## 2012-11-16 NOTE — Discharge Summary (Signed)
Obstetric Discharge Summary Reason for Admission: induction of labor Prenatal Procedures: none Intrapartum Procedures: spontaneous vaginal delivery Postpartum Procedures: none Complications-Operative and Postpartum: none Hemoglobin  Date Value Range Status  11/15/2012 11.0* 12.0 - 15.0 g/dL Final     HCT  Date Value Range Status  11/15/2012 31.7* 36.0 - 46.0 % Final    Physical Exam:  General: alert Lochia: appropriate Uterine Fundus: firm Incision: healing well DVT Evaluation: No evidence of DVT seen on physical exam.  Discharge Diagnoses: Term Pregnancy-delivered  Discharge Information: Date: 11/16/2012 Activity: pelvic rest Diet: routine Medications: Percocet Condition: improved Instructions: refer to practice specific booklet Discharge to: home Follow-up Information   Follow up with Chen Saadeh A, MD. Schedule an appointment as soon as possible for a visit in 6 weeks.   Contact information:   [address] [place] [address] [phone]       Newborn Data: Live born female  Birth Weight: 4 lb 10 oz (2098 g) APGAR: 9, 9  Home with mother.  Lorenz Donley A 11/16/2012, 6:42 AM

## 2012-11-16 NOTE — Progress Notes (Signed)
Patient ID: Andrea Stone, female   DOB: Aug 26, 1980, 32 y.o.   MRN: [id number] Postpartum day 2 Vital signs normal Fundus firm Legs negative No complaints home today on Percocet

## 2012-11-17 DIAGNOSIS — O139 Gestational [pregnancy-induced] hypertension without significant proteinuria, unspecified trimester: Secondary | ICD-10-CM | POA: Diagnosis not present

## 2012-11-17 MED ORDER — LABETALOL HCL 200 MG PO TABS: 200.0000 mg | ORAL_TABLET | Freq: Three times a day (TID) | ORAL | Status: DC

## 2013-01-22 ENCOUNTER — Other Ambulatory Visit: Payer: Self-pay | Admitting: Obstetrics

## 2013-01-30 ENCOUNTER — Encounter (HOSPITAL_COMMUNITY): Payer: Self-pay

## 2013-01-30 ENCOUNTER — Encounter (HOSPITAL_COMMUNITY)
Admission: RE | Admit: 2013-01-30 | Discharge: 2013-01-30 | Disposition: A | Discharge status: Home / Self Care | Payer: Medicaid Other | Source: Ambulatory Visit | Attending: Obstetrics | Admitting: Obstetrics

## 2013-01-30 DIAGNOSIS — Z01818 Encounter for other preprocedural examination: Secondary | ICD-10-CM | POA: Insufficient documentation

## 2013-01-30 DIAGNOSIS — Z01812 Encounter for preprocedural laboratory examination: Secondary | ICD-10-CM | POA: Insufficient documentation

## 2013-01-30 LAB — BASIC METABOLIC PANEL
CO2: 24 mEq/L (ref 19–32)
Chloride: 100 mEq/L (ref 96–112)
Glucose, Bld: 81 mg/dL (ref 70–99)
Potassium: 3.3 mEq/L — ABNORMAL LOW (ref 3.5–5.1)
Sodium: 136 mEq/L (ref 135–145)

## 2013-01-30 LAB — CBC
Hemoglobin: 12.9 g/dL (ref 12.0–15.0)
MCH: 29.9 pg (ref 26.0–34.0)
RBC: 4.31 MIL/uL (ref 3.87–5.11)

## 2013-01-30 NOTE — Patient Instructions (Signed)
Your procedure is scheduled on:02/06/13  Enter through the Main Entrance at :7am Pick up desk phone and dial [phone] and inform us of your arrival.  Please call [phone] if you have any problems the morning of surgery.  Remember: Do not eat food or drink liquids, including water, after midnight: Tuesday   You may brush your teeth the morning of surgery.  Take these meds the morning of surgery with a sip of water: Blood pressure pill  DO NOT wear jewelry, eye make-up, lipstick,body lotion, or dark fingernail polish.  (Polished toes are ok) You may wear deodorant.  If you are to be admitted after surgery, leave suitcase in car until your room has been assigned. Patients discharged on the day of surgery will not be allowed to drive home. Wear loose fitting, comfortable clothes for your ride home.

## 2013-02-01 NOTE — H&P (Signed)
Andrea Stone, Andrea Stone                ACCOUNT NO.:  [id number]  MEDICAL RECORD NO.:  [id number]  LOCATION:  PERIO                         FACILITY:  [facility]  PHYSICIAN:  Kathreen Cosier, M.D.DATE OF BIRTH:  1981/02/24  DATE OF ADMISSION:  01/21/2013 DATE OF DISCHARGE:                             HISTORY & PHYSICAL   The patient is a 32 year old, gravida 3, para 2-1-0-3 in for 6-week checkup and now desires tubal ligation.  Understands procedure can fail resulting in pregnancy in tube or uterus.  PAST MEDICAL HISTORY:  Negative.  PAST SURGICAL HISTORY:  Negative.  SOCIAL HISTORY:  Negative.  PHYSICAL EXAMINATION:  GENERAL:  A well-developed female in no distress. HEENT:  Negative. LUNGS:  Clear to P and A. HEART:  Regular rhythm.  No murmurs.  No gallops. LUNGS:  Clear. ABDOMEN:  Negative.  Uterus normal size.  Negative adnexa.  Pap smear negative. EXTREMITIES:  Negative.          ______________________________ Kathreen Cosier, M.D.     BAM/MEDQ  D:  02/01/2013  T:  02/01/2013  Job:  [id number]

## 2013-02-06 ENCOUNTER — Ambulatory Visit (HOSPITAL_COMMUNITY)
Admission: RE | Admit: 2013-02-06 | Discharge: 2013-02-06 | Disposition: A | Discharge status: Home / Self Care | Payer: Medicaid Other | Source: Ambulatory Visit | Attending: Obstetrics | Admitting: Obstetrics

## 2013-02-06 ENCOUNTER — Encounter (HOSPITAL_COMMUNITY): Payer: Self-pay | Admitting: Anesthesiology

## 2013-02-06 ENCOUNTER — Encounter (HOSPITAL_COMMUNITY)
Admission: RE | Disposition: A | Discharge status: Home / Self Care | Payer: Self-pay | Source: Ambulatory Visit | Attending: Obstetrics

## 2013-02-06 ENCOUNTER — Ambulatory Visit (HOSPITAL_COMMUNITY): Payer: Medicaid Other | Admitting: Anesthesiology

## 2013-02-06 DIAGNOSIS — Z302 Encounter for sterilization: Secondary | ICD-10-CM | POA: Insufficient documentation

## 2013-02-06 DIAGNOSIS — Z641 Problems related to multiparity: Secondary | ICD-10-CM | POA: Insufficient documentation

## 2013-02-06 HISTORY — PX: LAPAROSCOPIC TUBAL LIGATION: SHX1937

## 2013-02-06 SURGERY — LIGATION, FALLOPIAN TUBE, LAPAROSCOPIC
Anesthesia: General | Site: Abdomen | Laterality: Bilateral | Wound class: Clean Contaminated

## 2013-02-06 MED ORDER — OXYCODONE-ACETAMINOPHEN 5-325 MG PO TABS
1.0000 | ORAL_TABLET | ORAL | Status: DC | PRN
  Administered 2013-02-06: 1 via ORAL

## 2013-02-06 MED ORDER — ONDANSETRON HCL 4 MG/2ML IJ SOLN
INTRAMUSCULAR | Status: AC
  Filled 2013-02-06: qty 2

## 2013-02-06 MED ORDER — NEOSTIGMINE METHYLSULFATE 1 MG/ML IJ SOLN
INTRAMUSCULAR | Status: DC | PRN
  Administered 2013-02-06: 2 mg via INTRAVENOUS

## 2013-02-06 MED ORDER — ROCURONIUM BROMIDE 50 MG/5ML IV SOLN
INTRAVENOUS | Status: AC
  Filled 2013-02-06: qty 1

## 2013-02-06 MED ORDER — FENTANYL CITRATE 0.05 MG/ML IJ SOLN
INTRAMUSCULAR | Status: AC
  Filled 2013-02-06: qty 5

## 2013-02-06 MED ORDER — GLYCOPYRROLATE 0.2 MG/ML IJ SOLN
INTRAMUSCULAR | Status: AC
  Filled 2013-02-06: qty 2

## 2013-02-06 MED ORDER — PROPOFOL 10 MG/ML IV EMUL
INTRAVENOUS | Status: AC
  Filled 2013-02-06: qty 20

## 2013-02-06 MED ORDER — METOCLOPRAMIDE HCL 5 MG/ML IJ SOLN: 10.0000 mg | Freq: Once | INTRAMUSCULAR | Status: DC | PRN

## 2013-02-06 MED ORDER — LACTATED RINGERS IV SOLN
INTRAVENOUS | Status: DC
  Administered 2013-02-06 (×2): via INTRAVENOUS

## 2013-02-06 MED ORDER — FENTANYL CITRATE 0.05 MG/ML IJ SOLN
INTRAMUSCULAR | Status: DC | PRN
  Administered 2013-02-06: 150 ug via INTRAVENOUS
  Administered 2013-02-06: 100 ug via INTRAVENOUS

## 2013-02-06 MED ORDER — DEXAMETHASONE SODIUM PHOSPHATE 10 MG/ML IJ SOLN
INTRAMUSCULAR | Status: AC
  Filled 2013-02-06: qty 1

## 2013-02-06 MED ORDER — NEOSTIGMINE METHYLSULFATE 1 MG/ML IJ SOLN
INTRAMUSCULAR | Status: AC
  Filled 2013-02-06: qty 1

## 2013-02-06 MED ORDER — KETOROLAC TROMETHAMINE 30 MG/ML IJ SOLN
INTRAMUSCULAR | Status: AC
  Filled 2013-02-06: qty 1

## 2013-02-06 MED ORDER — KETOROLAC TROMETHAMINE 30 MG/ML IJ SOLN
INTRAMUSCULAR | Status: DC | PRN
  Administered 2013-02-06: 30 mg via INTRAVENOUS

## 2013-02-06 MED ORDER — ACETAMINOPHEN 160 MG/5ML PO SOLN
ORAL | Status: AC
  Administered 2013-02-06: 975 mg via ORAL
  Filled 2013-02-06: qty 40.6

## 2013-02-06 MED ORDER — KETOROLAC TROMETHAMINE 30 MG/ML IJ SOLN: 15.0000 mg | Freq: Once | INTRAMUSCULAR | Status: DC | PRN

## 2013-02-06 MED ORDER — MIDAZOLAM HCL 2 MG/2ML IJ SOLN
INTRAMUSCULAR | Status: AC
  Filled 2013-02-06: qty 2

## 2013-02-06 MED ORDER — LIDOCAINE HCL (CARDIAC) 20 MG/ML IV SOLN
INTRAVENOUS | Status: DC | PRN
  Administered 2013-02-06: 40 mg via INTRAVENOUS

## 2013-02-06 MED ORDER — NALOXONE HCL 0.4 MG/ML IJ SOLN
INTRAMUSCULAR | Status: DC | PRN
  Administered 2013-02-06: 0.1 mg via INTRAVENOUS

## 2013-02-06 MED ORDER — ACETAMINOPHEN 160 MG/5ML PO SOLN: 975.0000 mg | Freq: Once | ORAL | Status: AC

## 2013-02-06 MED ORDER — FENTANYL CITRATE 0.05 MG/ML IJ SOLN: 25.0000 ug | INTRAMUSCULAR | Status: DC | PRN

## 2013-02-06 MED ORDER — ROCURONIUM BROMIDE 100 MG/10ML IV SOLN
INTRAVENOUS | Status: DC | PRN
  Administered 2013-02-06: 20 mg via INTRAVENOUS

## 2013-02-06 MED ORDER — DEXAMETHASONE SODIUM PHOSPHATE 10 MG/ML IJ SOLN
INTRAMUSCULAR | Status: DC | PRN
  Administered 2013-02-06: 10 mg via INTRAVENOUS

## 2013-02-06 MED ORDER — GLYCOPYRROLATE 0.2 MG/ML IJ SOLN
INTRAMUSCULAR | Status: DC | PRN
  Administered 2013-02-06: 0.4 mg via INTRAVENOUS

## 2013-02-06 MED ORDER — ONDANSETRON HCL 4 MG/2ML IJ SOLN
INTRAMUSCULAR | Status: DC | PRN
  Administered 2013-02-06: 4 mg via INTRAVENOUS

## 2013-02-06 MED ORDER — LIDOCAINE HCL (CARDIAC) 20 MG/ML IV SOLN
INTRAVENOUS | Status: AC
  Filled 2013-02-06: qty 5

## 2013-02-06 MED ORDER — MIDAZOLAM HCL 5 MG/5ML IJ SOLN
INTRAMUSCULAR | Status: DC | PRN
  Administered 2013-02-06: 2 mg via INTRAVENOUS

## 2013-02-06 MED ORDER — PROPOFOL 10 MG/ML IV BOLUS
INTRAVENOUS | Status: DC | PRN
  Administered 2013-02-06: 150 mg via INTRAVENOUS
  Administered 2013-02-06: 50 mg via INTRAVENOUS

## 2013-02-06 SURGICAL SUPPLY — 13 items
CATH ROBINSON RED A/P 16FR (CATHETERS) ×2 IMPLANT
CLOTH BEACON ORANGE TIMEOUT ST (SAFETY) ×2 IMPLANT
DERMABOND ADVANCED (GAUZE/BANDAGES/DRESSINGS) ×1
DERMABOND ADVANCED .7 DNX12 (GAUZE/BANDAGES/DRESSINGS) ×1 IMPLANT
GLOVE BIO SURGEON STRL SZ8.5 (GLOVE) ×4 IMPLANT
GOWN PREVENTION PLUS XXLARGE (GOWN DISPOSABLE) ×2 IMPLANT
GOWN STRL REIN XL XLG (GOWN DISPOSABLE) ×2 IMPLANT
PACK LAPAROSCOPY BASIN (CUSTOM PROCEDURE TRAY) ×2 IMPLANT
SUT MON AB 4-0 PS1 27 (SUTURE) ×2 IMPLANT
SUT VIC AB 0 CT1 27 (SUTURE) ×1
SUT VIC AB 0 CT1 27XBRD ANBCTR (SUTURE) ×1 IMPLANT
TOWEL OR 17X24 6PK STRL BLUE (TOWEL DISPOSABLE) ×4 IMPLANT
WATER STERILE IRR 1000ML POUR (IV SOLUTION) ×2 IMPLANT

## 2013-02-06 NOTE — Anesthesia Postprocedure Evaluation (Signed)
  Anesthesia Post-op Note  Patient: Andrea Stone  Procedure(s) Performed: Procedure(s): LAPAROSCOPIC TUBAL LIGATION (Bilateral)  Patient Location: PACU  Anesthesia Type:General  Level of Consciousness: awake, alert  and oriented  Airway and Oxygen Therapy: Patient Spontanous Breathing  Post-op Pain: none  Post-op Assessment: Post-op Vital signs reviewed, Patient's Cardiovascular Status Stable, Respiratory Function Stable, Patent Airway, No signs of Nausea or vomiting and Pain level controlled  Post-op Vital Signs: Reviewed and stable  Complications: No apparent anesthesia complications

## 2013-02-06 NOTE — H&P (Signed)
  There has been no change in her history and physical cyst original dictation

## 2013-02-06 NOTE — Op Note (Signed)
preop diagnosis multiparity desires sterilization Postop diagnosis is same Anesthesia Gen. Surgeon Dr. Francoise Ceo Procedure  Under  general anesthesia patient in the lithotomy position abdomen perineum and vagina prepped and draped  Bladder emptied  with a straight catheter the cervix was grasped with a Hulka tenaculum in the umbilicus a transverse incision made carried  Down  to the fascia fascia cleaned grasped  With two kochers  and the fascia and peritoneum opened y Mayo scissors the sleeve of the trocar was inserted intraperitoneally and 3 L of carbon dioxide  Infused  intraperitoneally the  Visualizing  scope was inserted through the sleeve of the trocar uterus tubes and ovaries normal cautery probe inserted through the sleeve the scope in the right tube  Grasped  1 inch from the  Cornu  and cauterize the tube was cauterized a total of 4 places   total from the first site of cautery the procedure was done in a similar fashion on the other side the pros removed CO2 allowed to escape from the peritoneal cavity fascia closed with one stitch of 0 Dexon and the skin  Closed  subcuticular stitch of 4-0 Monocryl patient tolerated the procedure well taken to recovery room in good condition and and

## 2013-02-06 NOTE — Transfer of Care (Signed)
Immediate Anesthesia Transfer of Care Note  Patient: Andrea Stone  Procedure(s) Performed: Procedure(s): LAPAROSCOPIC TUBAL LIGATION (Bilateral)  Patient Location: PACU  Anesthesia Type:General  Level of Consciousness: sedated  Airway & Oxygen Therapy: Patient Spontanous Breathing and Patient connected to nasal cannula oxygen  Post-op Assessment: Report given to PACU RN and Post -op Vital signs reviewed and stable  Post vital signs: stable  Complications: No apparent anesthesia complications

## 2013-02-06 NOTE — Anesthesia Preprocedure Evaluation (Signed)
Anesthesia Evaluation  Patient identified by MRN, date of birth, ID band Patient awake    Reviewed: Allergy & Precautions, H&P , NPO status , Patient's Chart, lab work & pertinent test results, reviewed documented beta blocker date and time   History of Anesthesia Complications Negative for: history of anesthetic complications  Airway Mallampati: I TM Distance: >3 FB Neck ROM: full    Dental  (+) Teeth Intact   Pulmonary Current Smoker (2 cigs/day),  breath sounds clear to auscultation  Pulmonary exam normal       Cardiovascular Exercise Tolerance: Good hypertension, On Home Beta Blockers Rhythm:regular Rate:Normal     Neuro/Psych negative neurological ROS  negative psych ROS   GI/Hepatic negative GI ROS, Neg liver ROS,   Endo/Other  negative endocrine ROS  Renal/GU negative Renal ROS  negative genitourinary   Musculoskeletal   Abdominal   Peds  Hematology negative hematology ROS (+)   Anesthesia Other Findings   Reproductive/Obstetrics negative OB ROS                           Anesthesia Physical Anesthesia Plan  ASA: II  Anesthesia Plan: General ETT   Post-op Pain Management:    Induction:   Airway Management Planned:   Additional Equipment:   Intra-op Plan:   Post-operative Plan:   Informed Consent: I have reviewed the patients History and Physical, chart, labs and discussed the procedure including the risks, benefits and alternatives for the proposed anesthesia with the patient or authorized representative who has indicated his/her understanding and acceptance.   Dental Advisory Given  Plan Discussed with: CRNA and Surgeon  Anesthesia Plan Comments:         Anesthesia Quick Evaluation

## 2013-02-07 ENCOUNTER — Encounter (HOSPITAL_COMMUNITY): Payer: Self-pay | Admitting: Obstetrics

## 2013-09-27 ENCOUNTER — Other Ambulatory Visit (HOSPITAL_COMMUNITY): Payer: Self-pay | Admitting: Obstetrics

## 2013-09-27 DIAGNOSIS — R1084 Generalized abdominal pain: Secondary | ICD-10-CM

## 2013-10-04 ENCOUNTER — Ambulatory Visit (HOSPITAL_COMMUNITY): Admission: RE | Admit: 2013-10-04 | Payer: Medicaid Other | Source: Ambulatory Visit

## 2013-10-14 ENCOUNTER — Ambulatory Visit (HOSPITAL_COMMUNITY): Admission: RE | Admit: 2013-10-14 | Payer: Medicaid Other | Source: Ambulatory Visit

## 2013-10-16 ENCOUNTER — Ambulatory Visit (HOSPITAL_COMMUNITY)
Admission: RE | Admit: 2013-10-16 | Discharge: 2013-10-16 | Disposition: A | Discharge status: Home / Self Care | Payer: Medicaid Other | Source: Ambulatory Visit | Attending: Obstetrics | Admitting: Obstetrics

## 2013-10-16 DIAGNOSIS — R1013 Epigastric pain: Secondary | ICD-10-CM | POA: Insufficient documentation

## 2013-10-16 DIAGNOSIS — R1084 Generalized abdominal pain: Secondary | ICD-10-CM

## 2014-04-14 ENCOUNTER — Encounter (HOSPITAL_COMMUNITY): Payer: Self-pay | Admitting: Obstetrics

## 2015-04-28 ENCOUNTER — Emergency Department (EMERGENCY_DEPARTMENT_HOSPITAL)
Admit: 2015-04-28 | Discharge: 2015-04-28 | Disposition: A | Discharge status: Home / Self Care | Payer: Medicaid Other | Attending: Emergency Medicine | Admitting: Emergency Medicine

## 2015-04-28 ENCOUNTER — Encounter (HOSPITAL_COMMUNITY): Payer: Self-pay | Admitting: *Deleted

## 2015-04-28 ENCOUNTER — Emergency Department (HOSPITAL_COMMUNITY)
Admission: EM | Admit: 2015-04-28 | Discharge: 2015-04-28 | Disposition: A | Discharge status: Home / Self Care | Payer: Medicaid Other | Attending: Emergency Medicine | Admitting: Emergency Medicine

## 2015-04-28 DIAGNOSIS — M79609 Pain in unspecified limb: Secondary | ICD-10-CM | POA: Diagnosis not present

## 2015-04-28 DIAGNOSIS — Z8619 Personal history of other infectious and parasitic diseases: Secondary | ICD-10-CM | POA: Diagnosis not present

## 2015-04-28 DIAGNOSIS — F17210 Nicotine dependence, cigarettes, uncomplicated: Secondary | ICD-10-CM | POA: Insufficient documentation

## 2015-04-28 DIAGNOSIS — M79651 Pain in right thigh: Secondary | ICD-10-CM | POA: Diagnosis not present

## 2015-04-28 DIAGNOSIS — I10 Essential (primary) hypertension: Secondary | ICD-10-CM | POA: Diagnosis not present

## 2015-04-28 DIAGNOSIS — M79606 Pain in leg, unspecified: Secondary | ICD-10-CM

## 2015-04-28 DIAGNOSIS — M79652 Pain in left thigh: Secondary | ICD-10-CM | POA: Insufficient documentation

## 2015-04-28 MED ORDER — PREDNISONE 50 MG PO TABS: 50.0000 mg | ORAL_TABLET | Freq: Every day | ORAL | Status: DC

## 2015-04-28 MED ORDER — HYDROCODONE-ACETAMINOPHEN 5-325 MG PO TABS: 1.0000 | ORAL_TABLET | Freq: Four times a day (QID) | ORAL | Status: DC | PRN

## 2015-04-28 NOTE — Discharge Instructions (Signed)
There are no signs of blood clot in her legs.  Follow-up with your primary care doctor.  Ice the areas that are sore

## 2015-04-28 NOTE — Progress Notes (Signed)
*  Preliminary Results* Bilateral lower extremity venous duplex completed. Bilateral lower extremities are negative for deep vein thrombosis. There is no evidence of Baker's cyst bilaterally.  04/28/2015  Annel Zunker, RVT, RDCS, RDMS  

## 2015-04-28 NOTE — ED Provider Notes (Signed)
CSN: [id number]     Arrival date & time 04/28/15  [id number] History  By signing my name below, I, Jarvis Morganaylor Ferguson, attest that this documentation has been prepared under the direction and in the presence of Charlestine Nighthristopher Lilton Pare, PA-C Electronically Signed: Jarvis Morganaylor Ferguson, ED Scribe. 04/28/2015. 9:19 AM.    Chief Complaint  Patient presents with  . Leg Pain    The history is provided by the patient. No language interpreter was used.    HPI Comments: Andrea GelinasRhonda Stone is a 34 y.o. female with a h/o HTN who presents to the Emergency Department complaining of constant, moderate, burning leg pain onset 1 month that began to gradually worsen this week. She states the pain is worse in her bilateral thighs. She notes she has an appt in 2 days at [facility] but reports she could not wait any longer. Pt has not taken any medications prior to arrival. She states the pain is exacerbated with ambulation. She denies any known injury or trauma. Pt denies any back pain or significant leg swelling.   Past Medical History  Diagnosis Date  . Hypertension   . UTI (urinary tract infection) during pregnancy   . Trichomoniasis    Past Surgical History  Procedure Laterality Date  . Wisdom tooth extraction    . Laparoscopic tubal ligation Bilateral 02/06/2013    Procedure: LAPAROSCOPIC TUBAL LIGATION;  Surgeon: Kathreen CosierBernard A Marshall, MD;  Location: [facility];  Service: Gynecology;  Laterality: Bilateral;   Family History  Problem Relation Age of Onset  . Anesthesia problems Neg Hx   . Other Neg Hx   . Hypertension Mother   . Asthma Sister   . Kidney disease Maternal Grandmother    Social History  Substance Use Topics  . Smoking status: Current Some Day Smoker -- 0.25 packs/day for 13 years    Types: Cigarettes  . Smokeless tobacco: Never Used  . Alcohol Use: Yes     Comment: socially   OB History    Gravida Para Term Preterm AB TAB SAB Ectopic Multiple Living   3 3 3  0 0 0 0 0 0 3     Review of  Systems 10 Systems reviewed and all are negative for acute change except as noted in the HPI.    Allergies  Review of patient's allergies indicates no known allergies.  Home Medications   Prior to Admission medications   Not on File   Triage Vitals: BP 109/72 mmHg  Pulse 62  Temp(Src) 97.6 F (36.4 C) (Oral)  Resp 16  SpO2 100%  LMP 03/28/2015  Physical Exam  Constitutional: She is oriented to person, place, and time. She appears well-developed and well-nourished. No distress.  HENT:  Head: Normocephalic and atraumatic.  Neck: Normal range of motion. Neck supple. No tracheal deviation present.  Cardiovascular: Normal rate, regular rhythm and normal heart sounds.  Exam reveals no gallop and no friction rub.   No murmur heard. Pulmonary/Chest: Effort normal. No respiratory distress. She has no wheezes.  Musculoskeletal: Normal range of motion. She exhibits no edema.       Cervical back: Normal.       Thoracic back: Normal.       Lumbar back: Normal.  Posterior thigh, palpable pain and medial thigh tenderness, otherwise no swelling Normal ROM without pain  Neurological: She is alert and oriented to person, place, and time. She has normal reflexes. She exhibits normal muscle tone. Coordination normal.  Skin: Skin is warm and dry.  Psychiatric: She has a normal mood and affect. Her behavior is normal.  Nursing note and vitals reviewed.   ED Course  Procedures (including critical care time)  DIAGNOSTIC STUDIES: Oxygen Saturation is 100% on RA, normal by my interpretation.    COORDINATION OF CARE: . 9:48 PM- Will order [place] of lower extremity. Pt advised of plan for treatment and pt agrees.   I have personally reviewed and evaluated these images and lab results as part of my medical decision-making.  Patient's lower extremity discomfort, is unexplained at this time, but she does not have any signs of blood clot.  The patient will be advised follow-up with her primary care  doctor as scheduled   Charlestine Night, PA-C 04/28/15 1703  Arby Barrette, MD 04/29/15 [phone]

## 2015-04-28 NOTE — ED Notes (Signed)
Pt reports she has an Appt . this Thursday at [facility] office but can not wait because the leg pain . Pt reports burning to Bilateral legs for one month.

## 2015-04-28 NOTE — ED Notes (Signed)
Patient transported to Ultrasound 

## 2015-12-22 ENCOUNTER — Ambulatory Visit: Payer: Self-pay | Admitting: Obstetrics and Gynecology

## 2016-01-16 ENCOUNTER — Encounter (HOSPITAL_COMMUNITY): Payer: Self-pay | Admitting: Emergency Medicine

## 2016-01-16 ENCOUNTER — Emergency Department (HOSPITAL_COMMUNITY)
Admission: EM | Admit: 2016-01-16 | Discharge: 2016-01-16 | Disposition: A | Discharge status: Home / Self Care | Payer: Medicaid Other | Attending: Emergency Medicine | Admitting: Emergency Medicine

## 2016-01-16 DIAGNOSIS — I10 Essential (primary) hypertension: Secondary | ICD-10-CM | POA: Insufficient documentation

## 2016-01-16 DIAGNOSIS — F17210 Nicotine dependence, cigarettes, uncomplicated: Secondary | ICD-10-CM | POA: Insufficient documentation

## 2016-01-16 DIAGNOSIS — J029 Acute pharyngitis, unspecified: Secondary | ICD-10-CM | POA: Diagnosis present

## 2016-01-16 DIAGNOSIS — J020 Streptococcal pharyngitis: Secondary | ICD-10-CM | POA: Insufficient documentation

## 2016-01-16 LAB — RAPID STREP SCREEN (MED CTR MEBANE ONLY): Streptococcus, Group A Screen (Direct): POSITIVE — AB

## 2016-01-16 MED ORDER — KETOROLAC TROMETHAMINE 30 MG/ML IJ SOLN
30.0000 mg | Freq: Once | INTRAMUSCULAR | Status: AC
  Administered 2016-01-16: 30 mg via INTRAVENOUS
  Filled 2016-01-16: qty 1

## 2016-01-16 MED ORDER — SODIUM CHLORIDE 0.9 % IV BOLUS (SEPSIS)
1000.0000 mL | Freq: Once | INTRAVENOUS | Status: AC
Start: 2016-01-16 — End: 2016-01-16
  Administered 2016-01-16: 1000 mL via INTRAVENOUS

## 2016-01-16 MED ORDER — SODIUM CHLORIDE 0.9 % IV BOLUS (SEPSIS)
1000.0000 mL | Freq: Once | INTRAVENOUS | Status: AC
  Administered 2016-01-16: 1000 mL via INTRAVENOUS

## 2016-01-16 MED ORDER — ACETAMINOPHEN 160 MG/5ML PO SOLN
650.0000 mg | Freq: Once | ORAL | Status: AC
  Administered 2016-01-16: 650 mg via ORAL

## 2016-01-16 MED ORDER — ACETAMINOPHEN 160 MG/5ML PO SOLN
ORAL | Status: AC
  Filled 2016-01-16: qty 20.3

## 2016-01-16 MED ORDER — DEXAMETHASONE SODIUM PHOSPHATE 10 MG/ML IJ SOLN
10.0000 mg | Freq: Once | INTRAMUSCULAR | Status: AC
  Administered 2016-01-16: 10 mg via INTRAVENOUS
  Filled 2016-01-16: qty 1

## 2016-01-16 MED ORDER — PENICILLIN G BENZATHINE 1200000 UNIT/2ML IM SUSP
1.2000 10*6.[IU] | Freq: Once | INTRAMUSCULAR | Status: AC
  Administered 2016-01-16: 1.2 10*6.[IU] via INTRAMUSCULAR
  Filled 2016-01-16: qty 2

## 2016-01-16 MED ORDER — SODIUM CHLORIDE 0.9 % IV BOLUS (SEPSIS)
500.0000 mL | Freq: Once | INTRAVENOUS | Status: AC
  Administered 2016-01-16: 500 mL via INTRAVENOUS

## 2016-01-16 NOTE — ED Triage Notes (Signed)
Pt c/o throat swelling, difficulty swallowing, throat swollen, red tonsils with white patches,

## 2016-01-16 NOTE — ED Notes (Signed)
Declined W/C at D/C and was escorted to lobby by RN. 

## 2016-01-16 NOTE — Discharge Instructions (Signed)
You can take ibuprofen 600 mg and/or acetaminophen 1000 mg 4 times a day for pain and fever, you can take the children's liquid, but take the adult dose.   Recheck if you get worse, you have trouble breathing, you are unable to swallow.

## 2016-01-16 NOTE — ED Provider Notes (Signed)
[facility]-EMERGENCY DEPT Provider Note   CSN: [id number] Arrival date & time: 01/16/16  4098  First Provider Contact:  First MD Initiated Contact with Patient 01/16/16 0913        History   Chief Complaint Chief Complaint  Patient presents with  . Sore Throat    HPI Andrea Stone is a 35 y.o. female.  HPI patient reports yesterday she felt like her throat was swelling and it was painful to swallow. She reports a lot of pain underneath her left mandible. She had cold chills yesterday but did not check her temperature. She states she gets sore throats a lot and estimates about every other month. She initially denied seeing an ENT, and stated her doctor needed to refer her for this problem however, she then said she saw one about a month ago. She states they checked her for a thyroid problem. They told her her thyroid was normal. She denies being around anybody else who is ill. She states she is unable to swallow even her own saliva.   PCP ? [facility], Dr Deterding  (initially said [facility])  Past Medical History:  Diagnosis Date  . Hypertension   . Trichomoniasis   . UTI (urinary tract infection) during pregnancy     There are no active problems to display for this patient.   Past Surgical History:  Procedure Laterality Date  . LAPAROSCOPIC TUBAL LIGATION Bilateral 02/06/2013   Procedure: LAPAROSCOPIC TUBAL LIGATION;  Surgeon: Kathreen Cosier, MD;  Location: [facility];  Service: Gynecology;  Laterality: Bilateral;  . WISDOM TOOTH EXTRACTION      OB History    Gravida Para Term Preterm AB Living   0 0 3   SAB TAB Ectopic Multiple Live Births   0 0 0 0 3       Home Medications    Prior to Admission medications   Medication Sig Start Date End Date Taking? Authorizing Provider  HYDROcodone-acetaminophen (NORCO/VICODIN) 5-325 MG tablet Take 1 tablet by mouth every 6 (six) hours as needed for moderate pain. 04/28/15   Charlestine Night, PA-C  predniSONE  (DELTASONE) 50 MG tablet Take 1 tablet (50 mg total) by mouth daily. 04/28/15   Charlestine Night, PA-C    Family History Family History  Problem Relation Age of Onset  . Kidney disease Maternal Grandmother   . Hypertension Mother   . Asthma Sister   . Anesthesia problems Neg Hx   . Other Neg Hx     Social History Social History  Substance Use Topics  . Smoking status: Current Some Day Smoker    Packs/day: 0.25    Years: 13.00    Types: Cigarettes  . Smokeless tobacco: Never Used  . Alcohol use Yes     Comment: socially  Smokes 2 cigarettes a day Unemployed   Allergies   Review of patient's allergies indicates no known allergies.   Review of Systems Review of Systems  All other systems reviewed and are negative.    Physical Exam Updated Vital Signs BP 115/76 (BP Location: Right Arm)   Pulse (S) (!) 127   Temp (S) (!) 103.1 F (39.5 C) (Oral)   LMP 12/25/2015   SpO2 100%   Vital signs normal except for tachycardia and fever   Physical Exam  Constitutional: She is oriented to person, place, and time. She appears well-developed and well-nourished.  Non-toxic appearance. She does not appear ill. No distress.  HENT:  Head: Normocephalic and atraumatic.  Right  Ear: External ear normal.  Left Ear: External ear normal.  Nose: Nose normal. No mucosal edema or rhinorrhea.  Mouth/Throat: Oropharynx is clear and moist and mucous membranes are normal. No dental abscesses or uvula swelling.  Patient is noted to have an almost normal voice, there is no "hot potato voice. She is not drooling. She states she is spitting in a cup but she does not hold a cup. She has diffuse tonsillar swelling bilaterally with diffuse redness and a few scattered exudates. There is no soft palate swelling seen. There is no asymmetry or swelling noted of her submandibular area.  Eyes: Conjunctivae and EOM are normal. Pupils are equal, round, and reactive to light.  Neck: Normal range of motion  and full passive range of motion without pain. Neck supple.  Cardiovascular: Normal rate, regular rhythm and normal heart sounds.  Exam reveals no gallop and no friction rub.   No murmur heard. Pulmonary/Chest: Effort normal and breath sounds normal. No respiratory distress. She has no wheezes. She has no rhonchi. She has no rales. She exhibits no tenderness and no crepitus.  Abdominal: Soft. Normal appearance and bowel sounds are normal. She exhibits no distension. There is no tenderness. There is no rebound and no guarding.  Musculoskeletal: Normal range of motion. She exhibits no edema or tenderness.  Moves all extremities well.   Neurological: She is alert and oriented to person, place, and time. She has normal strength. No cranial nerve deficit.  Skin: Skin is warm, dry and intact. No rash noted. No erythema. No pallor.  Psychiatric: She has a normal mood and affect. Her speech is normal and behavior is normal. Her mood appears not anxious.  Nursing note and vitals reviewed.    ED Treatments / Results  Labs (all labs ordered are listed, but only abnormal results are displayed) Labs Reviewed  RAPID STREP SCREEN (NOT AT [facility]) - Abnormal; Notable for the following:       Result Value   Streptococcus, Group A Screen (Direct) POSITIVE (*)    All other components within normal limits   Laboratory interpretation all normal except positive for strep     Radiology No results found.  Procedures Procedures (including critical care time)  Medications Ordered in ED Medications  acetaminophen (TYLENOL) 160 MG/5ML solution (not administered)  acetaminophen (TYLENOL) solution 650 mg (650 mg Oral Given 01/16/16 0836)  sodium chloride 0.9 % bolus 1,000 mL (0 mLs Intravenous Stopped 01/16/16 1117)  sodium chloride 0.9 % bolus 500 mL (0 mLs Intravenous Stopped 01/16/16 1121)  dexamethasone (DECADRON) injection 10 mg (10 mg Intravenous Given 01/16/16 0949)  penicillin g benzathine (BICILLIN LA)  1200000 UNIT/2ML injection 1.2 Million Units (1.2 Million Units Intramuscular Given 01/16/16 0949)  ketorolac (TORADOL) 30 MG/ML injection 30 mg (30 mg Intravenous Given 01/16/16 0949)  sodium chloride 0.9 % bolus 1,000 mL (0 mLs Intravenous Stopped 01/16/16 1255)  sodium chloride 0.9 % bolus 1,000 mL (0 mLs Intravenous Stopped 01/16/16 1255)     Initial Impression / Assessment and Plan / ED Course  I have reviewed the triage vital signs and the nursing notes.  Pertinent labs & imaging results that were available during my care of the patient were reviewed by me and considered in my medical decision making (see chart for details).  Clinical Course   Patient was given IV fluids, IV steroids and IV pain medication. We discussed she could take pills 4 times a day, however she states she could not take her regular morning  pills today because of pain on swallowing. She elected to do the Bicillin IM for her strep pharyngitis. At this point I do not see evidence of peritonsillar abscess although her complaints of pain appears to be mainly on the left.  Patient was given 3 and half liters of IV fluids. She did start having good urinary output.  After patient was given IV fluids and meds she was noted to be sleeping soundly during her ED visit. Her blood pressure were staying was in the high 80s and mid 90s. Finally she was kept awake in her blood pressures did improve up to 114 systolic. Orthostatics were normal.  01/16/16 1300 -- -- 18 -- 100 % -- 97 Monitor -- 104/70 Automatic -- Standing -- 0 ARM  01/16/16 1258 -- -- 18 -- 100 % -- 90 Monitor -- 107/72 Automatic -- Sitting -- 0 ARM  01/16/16 1256 -- -- 18 -- 100 % -- 88 Monitor -- 113/72 Automatic -- Lying -- 0 ARM      The patient is noted to have a MAP's <65/ SBP's <90. With the current information available to me, I don't think the patient is in septic shock. The MAP's <65/ SBP's <90, is related to strept throat .   Final Clinical Impressions(s)  / ED Diagnoses   Final diagnoses:  Strep pharyngitis    Plan discharge  Devoria Albe, MD, Concha Pyo, MD 01/16/16 1331

## 2016-03-11 LAB — PROCEDURE REPORT - SCANNED: PAP SMEAR: NEGATIVE

## 2016-09-26 ENCOUNTER — Ambulatory Visit: Payer: Medicaid Other | Admitting: Neurology

## 2016-10-03 ENCOUNTER — Ambulatory Visit: Payer: Medicaid Other | Admitting: Neurology

## 2016-11-04 ENCOUNTER — Ambulatory Visit: Payer: Medicaid Other | Admitting: Neurology

## 2016-11-28 ENCOUNTER — Ambulatory Visit: Payer: Medicaid Other | Admitting: Neurology

## 2016-11-28 ENCOUNTER — Telehealth: Payer: Self-pay

## 2016-11-28 NOTE — Telephone Encounter (Signed)
Pt no-showed her new pt appt this am.

## 2016-11-30 ENCOUNTER — Encounter: Payer: Self-pay | Admitting: Neurology

## 2017-01-09 ENCOUNTER — Ambulatory Visit: Payer: Medicaid Other | Admitting: Neurology

## 2017-03-01 ENCOUNTER — Ambulatory Visit: Payer: Self-pay | Admitting: Neurology

## 2017-03-02 ENCOUNTER — Telehealth: Payer: Self-pay

## 2017-03-02 NOTE — Telephone Encounter (Signed)
Patient cancel appt 09/26/2016, 10/03/2016, 11/04/2016-pt left without being seen.These were done timely.  These are no show appts 03/01/2017 and 11/28/2016.

## 2017-03-02 NOTE — Telephone Encounter (Signed)
Patient no show for appt on 03/01/2017.

## 2017-03-07 ENCOUNTER — Encounter: Payer: Self-pay | Admitting: Neurology

## 2017-04-25 ENCOUNTER — Emergency Department (HOSPITAL_COMMUNITY): Payer: Medicaid Other

## 2017-04-25 ENCOUNTER — Emergency Department (HOSPITAL_COMMUNITY)
Admission: EM | Admit: 2017-04-25 | Discharge: 2017-04-25 | Disposition: A | Discharge status: Home / Self Care | Payer: Medicaid Other | Attending: Emergency Medicine | Admitting: Emergency Medicine

## 2017-04-25 ENCOUNTER — Other Ambulatory Visit: Payer: Self-pay

## 2017-04-25 ENCOUNTER — Encounter (HOSPITAL_COMMUNITY): Payer: Self-pay | Admitting: Emergency Medicine

## 2017-04-25 DIAGNOSIS — Z79899 Other long term (current) drug therapy: Secondary | ICD-10-CM | POA: Insufficient documentation

## 2017-04-25 DIAGNOSIS — F17210 Nicotine dependence, cigarettes, uncomplicated: Secondary | ICD-10-CM | POA: Insufficient documentation

## 2017-04-25 DIAGNOSIS — M546 Pain in thoracic spine: Secondary | ICD-10-CM | POA: Insufficient documentation

## 2017-04-25 DIAGNOSIS — M25511 Pain in right shoulder: Secondary | ICD-10-CM | POA: Diagnosis not present

## 2017-04-25 DIAGNOSIS — I10 Essential (primary) hypertension: Secondary | ICD-10-CM | POA: Insufficient documentation

## 2017-04-25 NOTE — ED Triage Notes (Signed)
Pt to ER for persistent chronic back pain. States worse with movement and lying down. Pt states PCP will no longer see her because she had to miss appointments due to lack of child care. Pt ambulatory. NAD. Denies bowel/bladder dysfunction.

## 2017-04-25 NOTE — Discharge Instructions (Signed)
Please take Ibuprofen (Advil, motrin) and Tylenol (acetaminophen) to relieve your pain.  You may take up to 600 MG (3 pills) of normal strength ibuprofen every 8 hours as needed.  In between doses of ibuprofen you make take tylenol, up to 1,000 mg (two extra strength pills).  Do not take more than 3,000 mg tylenol in a 24 hour period.  Please check all medication labels as many medications such as pain and cold medications may contain tylenol.  Do not drink alcohol while taking these medications.  Do not take other NSAID'S while taking ibuprofen (such as aleve or naproxen).  Please take ibuprofen with food to decrease stomach upset. ° °The best way to get rid of muscle pain is by taking NSAIDS, using heat, massage therapy, and gentle stretching/range of motion exercises. ° °

## 2017-04-25 NOTE — ED Provider Notes (Signed)
[facility] Provider Note   CSN: [id number] Arrival date & time: 04/25/17  [id number]     History   Chief Complaint Chief Complaint  Patient presents with  . Back Pain    HPI Violeta GelinasRhonda Hougland is a 36 y.o. female with a history of hypertension, bilateral tubal ligation, self-reported rheumatoid arthritis who presents today for evaluation of back pain.  She reports that it began on Saturday.  She says that she is no longer able to be seen by her neurologist for her pain as she missed too many appointments.  She denies any trauma to the area.  She denies any difficulty walking, no changes in bowel/bladder function.  She says that she has been taking 3 mg ibuprofen without any relief.  She has not tried ice, heat, or Tylenol for her pains.  No fevers or chills, no personal history of cancer or IV drug use.  She does not have any difficulty walking, no changes to bowel/bladder function.  HPI  Past Medical History:  Diagnosis Date  . Hypertension   . Trichomoniasis   . UTI (urinary tract infection) during pregnancy     There are no active problems to display for this patient.   Past Surgical History:  Procedure Laterality Date  . WISDOM TOOTH EXTRACTION      OB History    Gravida Para Term Preterm AB Living   3 3 3  0 0 3   SAB TAB Ectopic Multiple Live Births   0 0 0 0 3       Home Medications    Prior to Admission medications   Medication Sig Start Date End Date Taking? Authorizing Provider  HYDROcodone-acetaminophen (NORCO/VICODIN) 5-325 MG tablet Take 1 tablet by mouth every 6 (six) hours as needed for moderate pain. 04/28/15   Lawyer, Cristal Deerhristopher, PA-C  predniSONE (DELTASONE) 50 MG tablet Take 1 tablet (50 mg total) by mouth daily. 04/28/15   Charlestine NightLawyer, Christopher, PA-C    Family History Family History  Problem Relation Age of Onset  . Kidney disease Maternal Grandmother   . Hypertension Mother   . Asthma Sister   . Anesthesia  problems Neg Hx   . Other Neg Hx     Social History Social History   Tobacco Use  . Smoking status: Current Some Day Smoker    Packs/day: 0.25    Years: 13.00    Pack years: 3.25    Types: Cigarettes  . Smokeless tobacco: Never Used  Substance Use Topics  . Alcohol use: Yes    Comment: socially  . Drug use: No     Allergies   Patient has no known allergies.   Review of Systems Review of Systems  Constitutional: Negative for chills, fatigue and fever.  Respiratory: Negative for cough, chest tightness and shortness of breath.   Cardiovascular: Negative for chest pain and palpitations.  Gastrointestinal: Negative for abdominal pain, nausea and vomiting.  Musculoskeletal: Positive for back pain.  Neurological: Negative for weakness and numbness.     Physical Exam Updated Vital Signs BP (!) 121/93 (BP Location: Right Arm)   Pulse 82   Temp 98.6 F (37 C) (Oral)   Resp 18   LMP 03/26/2017   SpO2 100%   Physical Exam  Constitutional: She is oriented to person, place, and time. She appears well-developed and well-nourished.  HENT:  Head: Normocephalic and atraumatic.  Neck: Normal range of motion. Neck supple.  No tenderness to palpation, midline, or cervical paraspinal muscles.  Cardiovascular: Normal rate, regular rhythm, normal heart sounds and intact distal pulses.  2+ radial pulses bilaterally.  Pulmonary/Chest: Effort normal. No respiratory distress. She has no wheezes. She has rhonchi (Slight) in the right upper field.  Musculoskeletal:  There is tenderness to palpation inferior to the tip of the right scapula.  Palpation here both re-creates and exacerbates her pain.  She does not have any midline thoracic pain.    Neurological: She is alert and oriented to person, place, and time.  Sensation intact and symmetrical to bilateral upper and lower extremities.  Skin: Skin is warm and dry. She is not diaphoretic.  Psychiatric: She has a normal mood and affect.  Her behavior is normal.  Nursing note and vitals reviewed.    ED Treatments / Results  Labs (all labs ordered are listed, but only abnormal results are displayed) Labs Reviewed - No data to display  EKG  EKG Interpretation None       Radiology Dg Chest 2 View  Result Date: 04/25/2017 CLINICAL DATA:  Right shoulder pain for 2 days. EXAM: CHEST  2 VIEW COMPARISON:  None. FINDINGS: AP and lateral view show no focal airspace consolidation, pulmonary edema, or pleural effusion. Cardiopericardial silhouette is at upper limits of normal for size. The visualized bony structures of the thorax are intact. IMPRESSION: No active cardiopulmonary disease. Electronically Signed   By: Kennith CenterEric  Mansell M.D.   On: 04/25/2017 11:37    Procedures Procedures (including critical care time)  Medications Ordered in ED Medications - No data to display   Initial Impression / Assessment and Plan / ED Course  I have reviewed the triage vital signs and the nursing notes.  Pertinent labs & imaging results that were available during my care of the patient were reviewed by me and considered in my medical decision making (see chart for details).    Patient with back pain.  No neurological deficits and normal neuro exam.   No loss of bowel or bladder control.  No concern for cauda equina.  No fever, night sweats, weight loss, h/o cancer, IVDU.  Pain is easily re-producable with palpation over muscles directly inferior to the tip of the right scapula.  RICE protocol and pain medicine indicated and discussed with patient.  She was given the option for Mobic, and muscle relaxers however he refused both of these, stating she preferred to take ibuprofen and Tylenol.  Instructed to follow-up with primary care provider.  Pain is not consistent in location with pyelonephritis or renal related pain.     Final Clinical Impressions(s) / ED Diagnoses   Final diagnoses:  Acute right-sided thoracic back pain    ED  Discharge Orders    None       Norman ClayHammond, Trenese Haft W, PA-C 04/25/17 1206    Jacalyn LefevreHaviland, Julie, MD 04/25/17 1439

## 2017-06-20 ENCOUNTER — Encounter: Payer: Self-pay | Admitting: Family Medicine

## 2017-06-20 ENCOUNTER — Ambulatory Visit (INDEPENDENT_AMBULATORY_CARE_PROVIDER_SITE_OTHER): Payer: Medicaid Other | Admitting: Family Medicine

## 2017-06-20 VITALS — BP 114/76 | HR 68 | Temp 98.7°F | Resp 16 | Ht 64.0 in | Wt 161.0 lb

## 2017-06-20 DIAGNOSIS — Z131 Encounter for screening for diabetes mellitus: Secondary | ICD-10-CM

## 2017-06-20 DIAGNOSIS — D509 Iron deficiency anemia, unspecified: Secondary | ICD-10-CM | POA: Diagnosis not present

## 2017-06-20 DIAGNOSIS — Z8639 Personal history of other endocrine, nutritional and metabolic disease: Secondary | ICD-10-CM

## 2017-06-20 DIAGNOSIS — R079 Chest pain, unspecified: Secondary | ICD-10-CM | POA: Diagnosis not present

## 2017-06-20 DIAGNOSIS — Z3202 Encounter for pregnancy test, result negative: Secondary | ICD-10-CM | POA: Diagnosis not present

## 2017-06-20 DIAGNOSIS — Z23 Encounter for immunization: Secondary | ICD-10-CM

## 2017-06-20 LAB — POCT URINALYSIS DIP (DEVICE)
BILIRUBIN URINE: NEGATIVE
Glucose, UA: NEGATIVE mg/dL
HGB URINE DIPSTICK: NEGATIVE
KETONES UR: NEGATIVE mg/dL
Nitrite: NEGATIVE
Protein, ur: NEGATIVE mg/dL
SPECIFIC GRAVITY, URINE: 1.01 (ref 1.005–1.030)
Urobilinogen, UA: 0.2 mg/dL (ref 0.0–1.0)
pH: 6.5 (ref 5.0–8.0)

## 2017-06-20 LAB — POCT GLYCOSYLATED HEMOGLOBIN (HGB A1C): Hemoglobin A1C: 5.5

## 2017-06-20 LAB — POCT URINE PREGNANCY: Preg Test, Ur: NEGATIVE

## 2017-06-20 MED ORDER — ASPIRIN EC 81 MG PO TBEC: 81.0000 mg | DELAYED_RELEASE_TABLET | Freq: Every day | ORAL | 1 refills | Status: DC

## 2017-06-20 MED ORDER — RANITIDINE HCL 150 MG PO TABS: 150.0000 mg | ORAL_TABLET | Freq: Two times a day (BID) | ORAL | 0 refills | Status: DC

## 2017-06-20 NOTE — Patient Instructions (Signed)
- I would like to trial you on Ranitidine 150 mg twice daily as I feel your symptoms are most likely associated with GERD.   I have attached a handout regarding symptoms of concerning chest pain which will warrant you to go to the ED.    Nonspecific Chest Pain Chest pain can be caused by many different conditions. There is a chance that your pain could be related to something serious, such as a heart attack or a blood clot in your lungs. Chest pain can also be caused by conditions that are not life-threatening. If you have chest pain, it is very important to follow up with your doctor. Follow these instructions at home: Medicines  If you were prescribed an antibiotic medicine, take it as told by your doctor. Do not stop taking the antibiotic even if you start to feel better.  Take over-the-counter and prescription medicines only as told by your doctor. Lifestyle  Do not use any products that contain nicotine or tobacco, such as cigarettes and e-cigarettes. If you need help quitting, ask your doctor.  Do not drink alcohol.  Make lifestyle changes as told by your doctor. These may include: ? Getting regular exercise. Ask your doctor for some activities that are safe for you. ? Eating a heart-healthy diet. A diet specialist (dietitian) can help you to learn healthy eating options. ? Staying at a healthy weight. ? Managing diabetes, if needed. ? Lowering your stress, as with deep breathing or spending time in nature. General instructions  Avoid any activities that make you feel chest pain.  If your chest pain is because of heartburn: ? Raise (elevate) the head of your bed about 6 inches (15 cm). You can do this by putting blocks under the bed legs at the head of the bed. ? Do not sleep with extra pillows under your head. That does not help heartburn.  Keep all follow-up visits as told by your doctor. This is important. This includes any further testing if your chest pain does  not go away. Contact a doctor if:  Your chest pain does not go away.  You have a rash with blisters on your chest.  You have a fever.  You have chills. Get help right away if:  Your chest pain is worse.  You have a cough that gets worse, or you cough up blood.  You have very bad (severe) pain in your belly (abdomen).  You are very weak.  You pass out (faint).  You have either of these for no clear reason: ? Sudden chest discomfort. ? Sudden discomfort in your arms, back, neck, or jaw.  You have shortness of breath at any time.  You suddenly start to sweat, or your skin gets clammy.  You feel sick to your stomach (nauseous).  You throw up (vomit).  You suddenly feel light-headed or dizzy.  Your heart starts to beat fast, or it feels like it is skipping beats. These symptoms may be an emergency. Do not wait to see if the symptoms will go away. Get medical help right away. Call your local emergency services (911 in the [place] Do not drive yourself to the hospital. This information is not intended to replace advice given to you by your health care provider. Make sure you discuss any questions you have with your health care provider. Document Released: 11/16/2007 Document Revised: 02/22/2016 Document Reviewed: 02/22/2016 Elsevier Interactive Patient Education  2017 Elsevier Inc.    Gastroesophageal Reflux Disease, Adult Normally,  food travels down the esophagus and stays in the stomach to be digested. If a person has gastroesophageal reflux disease (GERD), food and stomach acid move back up into the esophagus. When this happens, the esophagus becomes sore and swollen (inflamed). Over time, GERD can make small holes (ulcers) in the lining of the esophagus. Follow these instructions at home: Diet  Follow a diet as told by your doctor. You may need to avoid foods and drinks such as: ? Coffee and tea (with or without caffeine). ? Drinks that contain alcohol. ? Energy  drinks and sports drinks. ? Carbonated drinks or sodas. ? Chocolate and cocoa. ? Peppermint and mint flavorings. ? Garlic and onions. ? Horseradish. ? Spicy and acidic foods, such as peppers, chili powder, curry powder, vinegar, hot sauces, and BBQ sauce. ? Citrus fruit juices and citrus fruits, such as oranges, lemons, and limes. ? Tomato-based foods, such as red sauce, chili, salsa, and pizza with red sauce. ? Fried and fatty foods, such as donuts, french fries, potato chips, and high-fat dressings. ? High-fat meats, such as hot dogs, rib eye steak, sausage, ham, and bacon. ? High-fat dairy items, such as whole milk, butter, and cream cheese.  Eat small meals often. Avoid eating large meals.  Avoid drinking large amounts of liquid with your meals.  Avoid eating meals during the 2-3 hours before bedtime.  Avoid lying down right after you eat.  Do not exercise right after you eat. General instructions  Pay attention to any changes in your symptoms.  Take over-the-counter and prescription medicines only as told by your doctor. Do not take aspirin, ibuprofen, or other NSAIDs unless your doctor says it is okay.  Do not use any tobacco products, including cigarettes, chewing tobacco, and e-cigarettes. If you need help quitting, ask your doctor.  Wear loose clothes. Do not wear anything tight around your waist.  Raise (elevate) the head of your bed about 6 inches (15 cm).  Try to lower your stress. If you need help doing this, ask your doctor.  If you are overweight, lose an amount of weight that is healthy for you. Ask your doctor about a safe weight loss goal.  Keep all follow-up visits as told by your doctor. This is important. Contact a doctor if:  You have new symptoms.  You lose weight and you do not know why it is happening.  You have trouble swallowing, or it hurts to swallow.  You have wheezing or a cough that keeps happening.  Your symptoms do not get better with  treatment.  You have a hoarse voice. Get help right away if:  You have pain in your arms, neck, jaw, teeth, or back.  You feel sweaty, dizzy, or light-headed.  You have chest pain or shortness of breath.  You throw up (vomit) and your throw up looks like blood or coffee grounds.  You pass out (faint).  Your poop (stool) is bloody or black.  You cannot swallow, drink, or eat. This information is not intended to replace advice given to you by your health care provider. Make sure you discuss any questions you have with your health care provider. Document Released: 11/16/2007 Document Revised: 11/05/2015 Document Reviewed: 09/24/2014 Elsevier Interactive Patient Education  [employer]2018 Elsevier Inc.

## 2017-06-20 NOTE — Progress Notes (Signed)
Patient ID: Andrea Stone, female    DOB: 09/02/1980, 37 y.o.   MRN: [id number]  PCP: Bing NeighborsHarris, Nneoma Harral S, FNP  Chief Complaint  Patient presents with  . Establish Care  . Hospitalization Follow-up    Subjective:  HPI Andrea GelinasRhonda Yingst is a 37 y.o. female presents to establish care and ED follow-up.   Medical history significant for anemia, hyperlipidemia, and current daily smoker.  Chest Pain  Onset of chest pain began a few months prior. Location is in the middle of chest. CP is worsened by walking activity. She has no history of asthma or cardiac disorder.  Reports recently stopped smoking a few weeks ago.  Reports shortness of breath with walking up and down stairs. Stopped smoking in December. Family history significant for cardiovascular disease:  Grandmother had defibrillator placed due to heart problems. Father had heart blockages and history of by-pass surgery. Uncertain of age of onset of heart problems of family members. CP is relieved by rest and lying still.  Headaches  Not a new problem. Ongoing, untreated, for several months. Onset uncertain. Occurring persistently daily  over several weeks. Headaches are aching and throbbing N.  history of seizures. Previously referred to neurology for evaluation of chronic back pain without sciatica, although never showed for appointments. She is not taking any medication for chronic headaches. Social History   Socioeconomic History  . Marital status: Single    Spouse name: Not on file  . Number of children: Not on file  . Years of education: Not on file  . Highest education level: Not on file  Social Needs  . Financial resource strain: Not on file  . Food insecurity - worry: Not on file  . Food insecurity - inability: Not on file  . Transportation needs - medical: Not on file  . Transportation needs - non-medical: Not on file  Occupational History  . Not on file  Tobacco Use  . Smoking status: Current Some Day Smoker    Packs/day:  0.25    Years: 13.00    Pack years: 3.25    Types: Cigarettes  . Smokeless tobacco: Never Used  Substance and Sexual Activity  . Alcohol use: Yes    Comment: socially  . Drug use: No  . Sexual activity: Yes    Birth control/protection: None  Other Topics Concern  . Not on file  Social History Narrative  . Not on file    Family History  Problem Relation Age of Onset  . Kidney disease Maternal Grandmother   . Hypertension Mother   . Asthma Sister   . Anesthesia problems Neg Hx   . Other Neg Hx      Review of Systems  Constitutional: Negative.   Respiratory: Negative.   Cardiovascular: Negative.   Neurological: Positive for headaches.  Hematological: Negative.   Psychiatric/Behavioral: Negative.      No Known Allergies  Prior to Admission medications   Medication Sig Start Date End Date Taking? Authorizing Provider  ferrous sulfate 325 (65 FE) MG tablet Take 325 mg by mouth daily. 06/16/17  Yes [provider]  pravastatin (PRAVACHOL) 80 MG tablet Take 80 mg by mouth daily. 06/16/17  Yes [provider]  HYDROcodone-acetaminophen (NORCO/VICODIN) 5-325 MG tablet Take 1 tablet by mouth every 6 (six) hours as needed for moderate pain. Patient not taking: Reported on 06/20/2017 04/28/15   Lawyer, Cristal Deerhristopher, PA-C  predniSONE (DELTASONE) 50 MG tablet Take 1 tablet (50 mg total) by mouth daily. Patient not taking: Reported  on 06/20/2017 04/28/15   Charlestine Night, PA-C    Past Medical, Surgical Family and Social History reviewed and updated.    Objective:   Today's Vitals   06/20/17 0929  BP: 114/76  Pulse: 68  Resp: 16  Temp: 98.7 F (37.1 C)  TempSrc: Oral  SpO2: 100%  Weight: 161 lb (73 kg)  Height: 5\' 4"  (1.626 m)    Wt Readings from Last 3 Encounters:  06/20/17 161 lb (73 kg)  01/30/13 121 lb (54.9 kg)  11/14/12 142 lb (64.4 kg)    Physical Exam General Appearance:    Alert, cooperative, no distress, appears stated age  Head:     Normocephalic, without obvious abnormality, atraumatic  Eyes:    PERRL, conjunctiva/corneas clear, EOM's intact  Back:     Symmetric, no curvature, ROM normal, no CVA tenderness  Lungs:     Clear to auscultation bilaterally, respirations unlabored  Chest Wall:    No tenderness or deformity   Heart:    Regular rate and rhythm, S1 and S2 normal, no murmur, rub   or gallop  Abdomen:     Soft, non-tender, bowel sounds active all four quadrants,    no masses, no organomegaly  Extremities:   Extremities normal, atraumatic, no cyanosis or edema  Neurologic:   Normal strength, sensation     Assessment & Plan:  1. Screening for diabetes mellitus, (A1C)5.5, normal A1c will repeat in 12 months. 2. Pregnancy examination or test, negative result negative pregnancy test. 3. Chest Pain, EKG negative for ischemia, ST elevation, ST depression, rhythm is normal sinus.  Given patient's ongoing chest pain and significant family history for cardiovascular disease, will refer patient to cardiology for second opinion. Will check a CBC and iron panel to rule out deficiency as the cause of chest pain.  Refilled medications per request of patient Orders Placed This Encounter  Procedures  . Flu Vaccine QUAD 36+ mos IM  . CBC with Differential  . Thyroid Panel With TSH  . Iron, TIBC and Ferritin Panel  . Comprehensive metabolic panel  . Ambulatory referral to Cardiology  . POCT glycosylated hemoglobin (Hb A1C)  . POCT urine pregnancy  . POCT urinalysis dip (device)    Meds ordered this encounter  Medications  . ranitidine (ZANTAC) 150 MG tablet    Sig: Take 1 tablet (150 mg total) by mouth 2 (two) times daily.    Dispense:  60 tablet    Refill:  0    Order Specific Question:   Supervising Provider    Answer:   Quentin Angst [id number]  . aspirin EC 81 MG tablet    Sig: Take 1 tablet (81 mg total) by mouth daily.    Dispense:  90 tablet    Refill:  1    Order Specific Question:   Supervising  Provider    Answer:   Quentin Angst [id number]     RTC: 2 weeks to evaluate CP     Godfrey Pick. Tiburcio Pea, MSN, FNP-C The Patient Care [facility]  [address] [place], [place] [address] [phone]

## 2017-06-21 ENCOUNTER — Telehealth: Payer: Self-pay | Admitting: Family Medicine

## 2017-06-21 LAB — IRON,TIBC AND FERRITIN PANEL
Ferritin: 21 ng/mL (ref 15–150)
IRON SATURATION: 89 % — AB (ref 15–55)
IRON: 297 ug/dL — AB (ref 27–159)
Total Iron Binding Capacity: 332 ug/dL (ref 250–450)
UIBC: 35 ug/dL — AB (ref 131–425)

## 2017-06-21 LAB — COMPREHENSIVE METABOLIC PANEL
ALBUMIN: 4 g/dL (ref 3.5–5.5)
ALT: 33 IU/L — ABNORMAL HIGH (ref 0–32)
AST: 22 IU/L (ref 0–40)
Albumin/Globulin Ratio: 1.4 (ref 1.2–2.2)
Alkaline Phosphatase: 64 IU/L (ref 39–117)
BUN / CREAT RATIO: 10 (ref 9–23)
BUN: 7 mg/dL (ref 6–20)
Bilirubin Total: 0.3 mg/dL (ref 0.0–1.2)
CO2: 22 mmol/L (ref 20–29)
Calcium: 8.6 mg/dL — ABNORMAL LOW (ref 8.7–10.2)
Chloride: 105 mmol/L (ref 96–106)
Creatinine, Ser: 0.73 mg/dL (ref 0.57–1.00)
GFR calc Af Amer: 123 mL/min/{1.73_m2} (ref >59)
GFR calc non Af Amer: 107 mL/min/{1.73_m2} (ref >59)
GLUCOSE: 89 mg/dL (ref 65–99)
Globulin, Total: 2.9 g/dL (ref 1.5–4.5)
Potassium: 4.3 mmol/L (ref 3.5–5.2)
Sodium: 139 mmol/L (ref 134–144)
Total Protein: 6.9 g/dL (ref 6.0–8.5)

## 2017-06-21 LAB — CBC WITH DIFFERENTIAL/PLATELET
BASOS ABS: 0 10*3/uL (ref 0.0–0.2)
BASOS: 0 %
EOS (ABSOLUTE): 0.2 10*3/uL (ref 0.0–0.4)
EOS: 3 %
HEMATOCRIT: 37.5 % (ref 34.0–46.6)
HEMOGLOBIN: 12.5 g/dL (ref 11.1–15.9)
IMMATURE GRANS (ABS): 0 10*3/uL (ref 0.0–0.1)
Immature Granulocytes: 0 %
LYMPHS ABS: 3.2 10*3/uL — AB (ref 0.7–3.1)
LYMPHS: 50 %
MCH: 30.2 pg (ref 26.6–33.0)
MCHC: 33.3 g/dL (ref 31.5–35.7)
MCV: 91 fL (ref 79–97)
MONOCYTES: 11 %
Monocytes Absolute: 0.7 10*3/uL (ref 0.1–0.9)
NEUTROS ABS: 2.3 10*3/uL (ref 1.4–7.0)
Neutrophils: 36 %
Platelets: 320 10*3/uL (ref 150–379)
RBC: 4.14 x10E6/uL (ref 3.77–5.28)
RDW: 14.9 % (ref 12.3–15.4)
WBC: 6.5 10*3/uL (ref 3.4–10.8)

## 2017-06-21 LAB — THYROID PANEL WITH TSH
Free Thyroxine Index: 2.1 (ref 1.2–4.9)
T3 Uptake Ratio: 25 % (ref 24–39)
T4, Total: 8.4 ug/dL (ref 4.5–12.0)
TSH: 0.342 u[IU]/mL — AB (ref 0.450–4.500)

## 2017-06-21 NOTE — Telephone Encounter (Signed)
Contact the patient to advise that her iron level is very abnormally high.  She should discontinue taking iron and any vitamin products that contain iron supplementation.  She has an appointment scheduled for follow-up on 07/04/2017 advised patient to please keep this appointment as I will need to repeat her iron panel and if it remains abnormally elevated she will need to be referred to a hematologist for further evaluation.   Godfrey PickKimberly S. Tiburcio PeaHarris, MSN, FNP-C The Patient Care [facility]  [address] [place], [place] [address] [phone]

## 2017-06-22 NOTE — Telephone Encounter (Signed)
Patient notified of results and will keep follow up appointment  

## 2017-06-23 ENCOUNTER — Telehealth: Payer: Self-pay

## 2017-06-23 NOTE — Telephone Encounter (Signed)
Patient notified of results.

## 2017-06-23 NOTE — Telephone Encounter (Signed)
Patient would like to know if she can take aspirin and ibuprofen together.

## 2017-06-23 NOTE — Telephone Encounter (Signed)
She should not take both the aspirin and ibuprofen at the same time. She is prescribed daily aspirin 81 for cardiac health. She should wait at least 1-2 hours before taking ibuprofen to avoid GI upset. If she is taking ibuprofen for pain regularly, we will need to discuss at her follow-up as chronic  use of aspirin and ibuprofen today can increase the risk of a GI bleed. She could take acetaminophen to reduce the risk of GI problems.   Godfrey PickKimberly S. Tiburcio PeaHarris, MSN, FNP-C The Patient Care [facility]  [address] [place], [place] [address] [phone]

## 2017-07-04 ENCOUNTER — Ambulatory Visit (INDEPENDENT_AMBULATORY_CARE_PROVIDER_SITE_OTHER): Payer: Medicaid Other | Admitting: Family Medicine

## 2017-07-04 ENCOUNTER — Encounter: Payer: Self-pay | Admitting: Family Medicine

## 2017-07-04 VITALS — BP 112/74 | HR 65 | Temp 97.9°F | Resp 14 | Ht 64.0 in | Wt 163.0 lb

## 2017-07-04 DIAGNOSIS — Z13220 Encounter for screening for lipoid disorders: Secondary | ICD-10-CM | POA: Diagnosis not present

## 2017-07-04 DIAGNOSIS — R0789 Other chest pain: Secondary | ICD-10-CM | POA: Diagnosis not present

## 2017-07-04 DIAGNOSIS — Z3200 Encounter for pregnancy test, result unknown: Secondary | ICD-10-CM | POA: Diagnosis not present

## 2017-07-04 DIAGNOSIS — Z01419 Encounter for gynecological examination (general) (routine) without abnormal findings: Secondary | ICD-10-CM

## 2017-07-04 DIAGNOSIS — R7989 Other specified abnormal findings of blood chemistry: Secondary | ICD-10-CM | POA: Diagnosis not present

## 2017-07-04 LAB — POCT URINE PREGNANCY: PREG TEST UR: NEGATIVE

## 2017-07-04 NOTE — Progress Notes (Signed)
Patient ID: Andrea Stone, female    DOB: 28-May-1981, 37 y.o.   MRN: [id number]  PCP: Bing Neighbors, FNP  Chief Complaint  Patient presents with  . Gynecologic Exam  . Labs    Subjective:  HPI Andrea Stone is a 37 y.o. female presents for fasting lipid panel and gynecological visit evaluation. Medical history significant for anemia, hyperlipidemia, and current daily smoker. Denies any known family history gynecological cancers-ovarian, endometrial, or vaginal. Occasionally perform breast exams. Denies any suspicious lumps.  Denies vaginal discharge, vaginal pain, lesions, dysuria, or pelvic pain. Andrea Stone continues to complain of atypical chest pain.  Chest pain is occurring without ceasing. Reports occasional shortness of breath however, is not experiencing dyspnea at present. Pain is located in the midsternal region.  She was referred to cardiology during her last follow-up for the same complaint.  She has upcoming cardiology appointment scheduled for 07/25/2017 with Dr. Herbie Baltimore. She was prescribed a trial of ranitidine to rule out GERD-like symptoms.  She reports taking medication twice and a family member noticed that her face was swelling therefore she discontinued the medication.  She did not have any symptoms of throat closure or shortness of breath, nausea or vomiting.  Ranitidine has been listed as an allergy. Ranae's iron panel during last office visit was twice the normal limit. She reports being told several months ago to start iron, however is uncertain any lab work was completed prior to iron replacement ordered. Social History   Socioeconomic History  . Marital status: Single    Spouse name: Not on file  . Number of children: Not on file  . Years of education: Not on file  . Highest education level: Not on file  Social Needs  . Financial resource strain: Not on file  . Food insecurity - worry: Not on file  . Food insecurity - inability: Not on file  . Transportation needs  - medical: Not on file  . Transportation needs - non-medical: Not on file  Occupational History  . Not on file  Tobacco Use  . Smoking status: Current Some Day Smoker    Packs/day: 0.25    Years: 13.00    Pack years: 3.25    Types: Cigarettes  . Smokeless tobacco: Never Used  Substance and Sexual Activity  . Alcohol use: Yes    Comment: socially  . Drug use: No  . Sexual activity: Yes    Birth control/protection: None  Other Topics Concern  . Not on file  Social History Narrative  . Not on file    Family History  Problem Relation Age of Onset  . Kidney disease Maternal Grandmother   . Hypertension Mother   . Asthma Sister   . Anesthesia problems Neg Hx   . Other Neg Hx    Review of Systems  Constitutional: Positive for fatigue.  Respiratory: Positive for shortness of breath. Negative for chest tightness.   Cardiovascular: Positive for chest pain. Negative for palpitations and leg swelling.  Genitourinary: Negative.   Neurological: Negative.   Psychiatric/Behavioral: Negative.    Allergies  Allergen Reactions  . Zantac [Ranitidine Hcl]     Prior to Admission medications   Medication Sig Start Date End Date Taking? Authorizing Provider  aspirin EC 81 MG tablet Take 1 tablet (81 mg total) by mouth daily. 06/20/17  Yes Bing Neighbors, FNP  pravastatin (PRAVACHOL) 80 MG tablet Take 80 mg by mouth daily. 06/16/17  Yes [provider]  ferrous sulfate 325 (65 FE)  MG tablet Take 325 mg by mouth daily. 06/16/17   [provider]  HYDROcodone-acetaminophen (NORCO/VICODIN) 5-325 MG tablet Take 1 tablet by mouth every 6 (six) hours as needed for moderate pain. Patient not taking: Reported on 06/20/2017 04/28/15   Lawyer, Cristal Deerhristopher, PA-C  predniSONE (DELTASONE) 50 MG tablet Take 1 tablet (50 mg total) by mouth daily. Patient not taking: Reported on 06/20/2017 04/28/15   Charlestine NightLawyer, Christopher, PA-C  ranitidine (ZANTAC) 150 MG tablet Take 1 tablet (150 mg total) by  mouth 2 (two) times daily. Patient not taking: Reported on 07/04/2017 06/20/17   Bing NeighborsHarris, Ceniya Fowers S, FNP    Past Medical, Surgical Family and Social History reviewed and updated.    Objective:   Today's Vitals   07/04/17 0956  BP: 112/74  Pulse: 65  Resp: 14  Temp: 97.9 F (36.6 C)  TempSrc: Oral  SpO2: 100%  Weight: 163 lb (73.9 kg)  Height: 5\' 4"  (1.626 m)    Wt Readings from Last 3 Encounters:  07/04/17 163 lb (73.9 kg)  06/20/17 161 lb (73 kg)  01/30/13 121 lb (54.9 kg)    Physical Exam  Constitutional: She appears well-developed and well-nourished.  Neck: Normal range of motion. Neck supple.  Cardiovascular: Normal rate, regular rhythm, normal heart sounds and intact distal pulses.  Pulmonary/Chest: Effort normal and breath sounds normal.  Abdominal: Soft. Bowel sounds are normal. There is no tenderness.  Genitourinary: Vaginal discharge found.  Genitourinary Comments: Breasts are symmetric without cutaneous changes, nipple inversion or discharge. No masses or tenderness, and no axillary lymphadenopathy. Normal female external genitalia without lesion. No inguinal lymphadenopathy. Vaginal mucosa is pink and moist without lesions. Cervix contains thick mucoid like discharge. Negative of cervical motion tenderness, adnexal fullness or tenderness.  Skin: Skin is warm and dry.  Psychiatric: She has a normal mood and affect. Her behavior is normal. Thought content normal.     Assessment & Plan:  1. Encounter for gynecological examination, abnormal odor and discharge present on exam. Will screen for STD and PapIG with HPV  2. Screening, lipid, fasting today. Will check Lipid panel.  3. Abnormal thyroid blood test, TSH abnormally decreased during prior study. Repeating TSH today.  4. Iron overload, total iron level 297 ug/dl as of 2 weeks prior. Patient discontinued iron x 2 weeks prior. Repeating Iron, TIBC and Ferritin Panel and Hepatic Function Panel today. Will consider  possible referral to hematology to rule out other forms of blood disorder and consideration of chelation therapy.  5. Encounter for pregnancy test, result unknown, negative  6. Atypical Chest Pain-EKG-NSR negative for ST and T wave changes. No change from prior EKG two weeks prior. CP may be related to iron overload?  Orders Placed This Encounter  Procedures  . Lipid panel  . Iron, TIBC and Ferritin Panel  . TSH  . Hepatic Function Panel  . POCT urine pregnancy   Will follow-up with you via phone regarding lab results.   RTC: 6 months routine wellness visit and check CBC, Iron panel, and CMP    Godfrey PickKimberly S. Tiburcio PeaHarris, MSN, FNP-C [facility]  [address] [place], [place] [address] [phone]

## 2017-07-05 LAB — LIPID PANEL
CHOLESTEROL TOTAL: 159 mg/dL (ref 100–199)
Chol/HDL Ratio: 3 ratio (ref 0.0–4.4)
HDL: 53 mg/dL (ref >39)
LDL Calculated: 94 mg/dL (ref 0–99)
TRIGLYCERIDES: 61 mg/dL (ref 0–149)
VLDL CHOLESTEROL CAL: 12 mg/dL (ref 5–40)

## 2017-07-06 LAB — HEPATIC FUNCTION PANEL
ALK PHOS: 64 IU/L (ref 39–117)
ALT: 29 IU/L (ref 0–32)
AST: 25 IU/L (ref 0–40)
Albumin: 4.2 g/dL (ref 3.5–5.5)
Bilirubin Total: <0.2 mg/dL (ref 0.0–1.2)
Bilirubin, Direct: 0.05 mg/dL (ref 0.00–0.40)
Total Protein: 7 g/dL (ref 6.0–8.5)

## 2017-07-06 LAB — TSH: TSH: 0.64 u[IU]/mL (ref 0.450–4.500)

## 2017-07-06 LAB — IRON,TIBC AND FERRITIN PANEL
Ferritin: 14 ng/mL — ABNORMAL LOW (ref 15–150)
Iron Saturation: 11 % — ABNORMAL LOW (ref 15–55)
Iron: 40 ug/dL (ref 27–159)
TIBC: 370 ug/dL (ref 250–450)
UIBC: 330 ug/dL (ref 131–425)

## 2017-07-06 LAB — SPECIMEN STATUS REPORT

## 2017-07-07 ENCOUNTER — Telehealth: Payer: Self-pay | Admitting: Family Medicine

## 2017-07-07 LAB — PAPIG, HPV, RFX 16/18
HPV, high-risk: NEGATIVE
PAP Smear Comment: 0

## 2017-07-07 LAB — PAPIG HPV, CTNG AGE GDLN ACOG

## 2017-07-07 MED ORDER — METRONIDAZOLE 500 MG PO TABS
500.0000 mg | ORAL_TABLET | Freq: Three times a day (TID) | ORAL | 0 refills | Status: DC
Start: 2017-07-07 — End: 2017-08-25

## 2017-07-07 NOTE — Telephone Encounter (Signed)
Called patient to advise of abnormal PAP results which were significant for trichomonas. HPV negative and no other abnormalities noted. E-prescribed metronidazole to [facility].  Godfrey PickKimberly S. Tiburcio PeaHarris, MSN, FNP-C The [facility]  [address] [place], [place] [address] [phone]

## 2017-07-12 ENCOUNTER — Telehealth: Payer: Self-pay

## 2017-07-13 NOTE — Telephone Encounter (Signed)
[facility] Patient was also advise of positive STI results and will pick up medication to start.

## 2017-07-14 ENCOUNTER — Ambulatory Visit: Payer: Medicaid Other | Admitting: Cardiovascular Disease

## 2017-07-14 HISTORY — PX: TRANSTHORACIC ECHOCARDIOGRAM: SHX275

## 2017-07-14 HISTORY — PX: OTHER SURGICAL HISTORY: SHX169

## 2017-07-16 ENCOUNTER — Emergency Department (HOSPITAL_COMMUNITY)
Admission: EM | Admit: 2017-07-16 | Discharge: 2017-07-17 | Disposition: A | Discharge status: Home / Self Care | Payer: Medicaid Other | Attending: Emergency Medicine | Admitting: Emergency Medicine

## 2017-07-16 ENCOUNTER — Encounter (HOSPITAL_COMMUNITY): Payer: Self-pay | Admitting: Emergency Medicine

## 2017-07-16 ENCOUNTER — Other Ambulatory Visit: Payer: Self-pay

## 2017-07-16 ENCOUNTER — Emergency Department (HOSPITAL_COMMUNITY): Payer: Medicaid Other

## 2017-07-16 DIAGNOSIS — Z7982 Long term (current) use of aspirin: Secondary | ICD-10-CM | POA: Insufficient documentation

## 2017-07-16 DIAGNOSIS — R42 Dizziness and giddiness: Secondary | ICD-10-CM | POA: Diagnosis not present

## 2017-07-16 DIAGNOSIS — I10 Essential (primary) hypertension: Secondary | ICD-10-CM | POA: Insufficient documentation

## 2017-07-16 DIAGNOSIS — Z79899 Other long term (current) drug therapy: Secondary | ICD-10-CM | POA: Insufficient documentation

## 2017-07-16 DIAGNOSIS — R079 Chest pain, unspecified: Secondary | ICD-10-CM | POA: Diagnosis present

## 2017-07-16 DIAGNOSIS — R0602 Shortness of breath: Secondary | ICD-10-CM | POA: Diagnosis not present

## 2017-07-16 DIAGNOSIS — F17210 Nicotine dependence, cigarettes, uncomplicated: Secondary | ICD-10-CM | POA: Insufficient documentation

## 2017-07-16 DIAGNOSIS — R600 Localized edema: Secondary | ICD-10-CM | POA: Insufficient documentation

## 2017-07-16 LAB — CBC
HCT: 37.4 % (ref 36.0–46.0)
HEMOGLOBIN: 12.6 g/dL (ref 12.0–15.0)
MCH: 29.5 pg (ref 26.0–34.0)
MCHC: 33.7 g/dL (ref 30.0–36.0)
MCV: 87.6 fL (ref 78.0–100.0)
Platelets: 363 10*3/uL (ref 150–400)
RBC: 4.27 MIL/uL (ref 3.87–5.11)
RDW: 13.7 % (ref 11.5–15.5)
WBC: 7.4 10*3/uL (ref 4.0–10.5)

## 2017-07-16 LAB — BASIC METABOLIC PANEL
ANION GAP: 11 (ref 5–15)
BUN: 8 mg/dL (ref 6–20)
CALCIUM: 8.9 mg/dL (ref 8.9–10.3)
CO2: 19 mmol/L — ABNORMAL LOW (ref 22–32)
Chloride: 107 mmol/L (ref 101–111)
Creatinine, Ser: 0.73 mg/dL (ref 0.44–1.00)
GFR calc Af Amer: >60 mL/min (ref >60)
GLUCOSE: 123 mg/dL — AB (ref 65–99)
Potassium: 3.4 mmol/L — ABNORMAL LOW (ref 3.5–5.1)
Sodium: 137 mmol/L (ref 135–145)

## 2017-07-16 LAB — I-STAT TROPONIN, ED: TROPONIN I, POC: 0 ng/mL (ref 0.00–0.08)

## 2017-07-16 LAB — I-STAT BETA HCG BLOOD, ED (MC, WL, AP ONLY): I-stat hCG, quantitative: <5 m[IU]/mL (ref <5)

## 2017-07-16 NOTE — ED Triage Notes (Signed)
Reports central chest heaviness and SOB for about a week.  Denies having any cough.  No cardiac history.

## 2017-07-17 LAB — D-DIMER, QUANTITATIVE: D-Dimer, Quant: 0.38 ug/mL-FEU (ref 0.00–0.50)

## 2017-07-17 MED ORDER — KETOROLAC TROMETHAMINE 30 MG/ML IJ SOLN
30.0000 mg | Freq: Once | INTRAMUSCULAR | Status: AC
Start: 2017-07-17 — End: 2017-07-17
  Administered 2017-07-17: 30 mg via INTRAMUSCULAR
  Filled 2017-07-17: qty 1

## 2017-07-17 NOTE — ED Provider Notes (Signed)
[facility] EMERGENCY DEPARTMENT Provider Note   CSN: [id number] Arrival date & time: 07/16/17  1901     History   Chief Complaint Chief Complaint  Patient presents with  . Chest Pain  . Shortness of Breath    HPI Andrea Stone is a 37 y.o. female.  Patient with past medical history remarkable for hypertension presents to the emergency department with a chief complaint of chest pain.  She reports having had pain for the past week.  She reports worsening pain with exertion.  She also reports shortness of breath and diaphoresis with exertion.  She also states that she feels lightheaded with exertion.  Denies any cardiac history.  She has not taken anything for symptoms.  She has been seen by her PCP for the same, and was advised to follow-up with a cardiologist.  She has a cardiology appointment on the 15th of this month.  Her symptoms are not worsened with palpation or movement.  She denies any burning sensation in her chest or abdomen.  She denies any history of PE or DVT.  She states that she does have intermittent lower extremity swelling, but denies any recent travel or immobilization.   The history is provided by the patient. No language interpreter was used.    Past Medical History:  Diagnosis Date  . Hypertension   . Trichomoniasis   . UTI (urinary tract infection) during pregnancy     There are no active problems to display for this patient.   Past Surgical History:  Procedure Laterality Date  . LAPAROSCOPIC TUBAL LIGATION Bilateral 02/06/2013   Procedure: LAPAROSCOPIC TUBAL LIGATION;  Surgeon: Kathreen Cosier, MD;  Location: [facility];  Service: Gynecology;  Laterality: Bilateral;  . WISDOM TOOTH EXTRACTION      OB History    Gravida Para Term Preterm AB Living   3 3 3  0 0 3   SAB TAB Ectopic Multiple Live Births   0 0 0 0 3       Home Medications    Prior to Admission medications   Medication Sig Start Date End Date Taking? Authorizing  Provider  aspirin EC 81 MG tablet Take 1 tablet (81 mg total) by mouth daily. 06/20/17   Bing Neighbors, FNP  ferrous sulfate 325 (65 FE) MG tablet Take 325 mg by mouth daily. 06/16/17   [provider]  HYDROcodone-acetaminophen (NORCO/VICODIN) 5-325 MG tablet Take 1 tablet by mouth every 6 (six) hours as needed for moderate pain. Patient not taking: Reported on 06/20/2017 04/28/15   Lawyer, Cristal Deer, PA-C  metroNIDAZOLE (FLAGYL) 500 MG tablet Take 1 tablet (500 mg total) by mouth 3 (three) times daily. 07/07/17   Bing Neighbors, FNP  pravastatin (PRAVACHOL) 80 MG tablet Take 80 mg by mouth daily. 06/16/17   [provider]  predniSONE (DELTASONE) 50 MG tablet Take 1 tablet (50 mg total) by mouth daily. Patient not taking: Reported on 06/20/2017 04/28/15   Charlestine Night, PA-C  ranitidine (ZANTAC) 150 MG tablet Take 1 tablet (150 mg total) by mouth 2 (two) times daily. Patient not taking: Reported on 07/04/2017 06/20/17   Bing Neighbors, FNP    Family History Family History  Problem Relation Age of Onset  . Kidney disease Maternal Grandmother   . Hypertension Mother   . Asthma Sister   . Anesthesia problems Neg Hx   . Other Neg Hx     Social History Social History   Tobacco Use  . Smoking status:  Current Some Day Smoker    Packs/day: 0.25    Years: 13.00    Pack years: 3.25    Types: Cigarettes  . Smokeless tobacco: Never Used  Substance Use Topics  . Alcohol use: Yes    Comment: socially  . Drug use: No     Allergies   Zantac [ranitidine hcl]   Review of Systems Review of Systems  All other systems reviewed and are negative.    Physical Exam Updated Vital Signs BP 135/89 (BP Location: Right Arm)   Pulse 78   Temp 98 F (36.7 C) (Oral)   Resp 16   Ht 5\' 4"  (1.626 m)   Wt 73.5 kg (162 lb)   LMP 06/27/2017   SpO2 100%   BMI 27.81 kg/m   Physical Exam  Constitutional: She is oriented to person, place, and time. She appears  well-developed and well-nourished.  HENT:  Head: Normocephalic and atraumatic.  Eyes: Conjunctivae and EOM are normal. Pupils are equal, round, and reactive to light.  Neck: Normal range of motion. Neck supple.  Cardiovascular: Normal rate and regular rhythm. Exam reveals no gallop and no friction rub.  No murmur heard. Pulmonary/Chest: Effort normal and breath sounds normal. No respiratory distress. She has no wheezes. She has no rales. She exhibits no tenderness.  Abdominal: Soft. Bowel sounds are normal. She exhibits no distension and no mass. There is no tenderness. There is no rebound and no guarding.  Musculoskeletal: Normal range of motion. She exhibits no edema or tenderness.       Right lower leg: Normal. She exhibits no tenderness and no edema.       Left lower leg: Normal. She exhibits no tenderness and no edema.  Neurological: She is alert and oriented to person, place, and time.  Skin: Skin is warm and dry.  Psychiatric: She has a normal mood and affect. Her behavior is normal. Judgment and thought content normal.  Nursing note and vitals reviewed.    ED Treatments / Results  Labs (all labs ordered are listed, but only abnormal results are displayed) Labs Reviewed  BASIC METABOLIC PANEL - Abnormal; Notable for the following components:      Result Value   Potassium 3.4 (*)    CO2 19 (*)    Glucose, Bld 123 (*)    All other components within normal limits  CBC  D-DIMER, QUANTITATIVE (NOT AT [facility])  I-STAT TROPONIN, ED  I-STAT BETA HCG BLOOD, ED ([facility], [facility], [facility] ONLY)    EKG  EKG Interpretation None       Radiology Dg Chest 2 View  Result Date: 07/16/2017 CLINICAL DATA:  Chest heaviness and pain for 1 week.  No cough. EXAM: CHEST  2 VIEW COMPARISON:  04/15/2017. FINDINGS: The heart size and mediastinal contours are within normal limits. Both lungs are clear. The visualized skeletal structures are unremarkable. IMPRESSION: No active cardiopulmonary disease.  Electronically Signed   By: Elsie Stain M.D.   On: 07/16/2017 20:13    Procedures Procedures (including critical care time)  Medications Ordered in ED Medications  ketorolac (TORADOL) 30 MG/ML injection 30 mg (not administered)     Initial Impression / Assessment and Plan / ED Course  I have reviewed the triage vital signs and the nursing notes.  Pertinent labs & imaging results that were available during my care of the patient were reviewed by me and considered in my medical decision making (see chart for details).     Patient presents with chest  pain x 1 week.  Worse with exertion.  DDx includes ACS, PE, pneumothorax, aortic dissection, esophageal rupture, pericarditis, chest wall pain.  Doubt ACS, normal troponin, no ischemic EKG findings, HEART score is: 1.  Low risk for PE, d dimer negative, patient is not tachycardic nor hypoxic.  No evidence of pneumothorax on CXR.  Doubt dissection, no mediastinal widening on CXR, no ripping/tearing chest pain, neurovascularly intact.  Doubt pericarditis, no positional changes, or diffuse ST elevations on EKG.  Pain is not reproducible, doubt MSK.  Recommend close follow-up with cards/PCP.  All findings were discussed with patient.  Patient understands and agrees with the plan.     Final Clinical Impressions(s) / ED Diagnoses   Final diagnoses:  Chest pain, unspecified type    ED Discharge Orders    None       Roxy HorsemanBrowning, Ayelet Gruenewald, PA-C 07/17/17 0159    Dione BoozeGlick, David, MD 07/17/17 [id number]

## 2017-07-20 ENCOUNTER — Telehealth: Payer: Self-pay

## 2017-07-20 NOTE — Telephone Encounter (Signed)
Since this will be a new contraceptive initiation, this will require an office visit.  Godfrey PickKimberly S. Tiburcio PeaHarris, MSN, FNP-C [facility]  [address] [place], [place] [address] [phone]

## 2017-07-20 NOTE — Telephone Encounter (Signed)
Patient notified and will callback when she is ready to schedule appointment.

## 2017-07-20 NOTE — Telephone Encounter (Signed)
Patient would like to start back on Depo shot and wants to know if she can just stop by and get that done. Patient aware that you are out of office until Friday.

## 2017-07-25 ENCOUNTER — Ambulatory Visit: Payer: Medicaid Other | Admitting: Cardiology

## 2017-07-28 ENCOUNTER — Encounter: Payer: Self-pay | Admitting: Cardiology

## 2017-07-28 ENCOUNTER — Ambulatory Visit (INDEPENDENT_AMBULATORY_CARE_PROVIDER_SITE_OTHER): Payer: Medicaid Other | Admitting: Cardiology

## 2017-07-28 VITALS — BP 130/90 | HR 90 | Ht 64.0 in | Wt 161.6 lb

## 2017-07-28 DIAGNOSIS — Z8249 Family history of ischemic heart disease and other diseases of the circulatory system: Secondary | ICD-10-CM

## 2017-07-28 DIAGNOSIS — R079 Chest pain, unspecified: Secondary | ICD-10-CM

## 2017-07-28 DIAGNOSIS — R0609 Other forms of dyspnea: Secondary | ICD-10-CM

## 2017-07-28 NOTE — Progress Notes (Signed)
PCP: Bing Neighbors, FNP  Clinic Note: Chief Complaint  Patient presents with  . Follow-up    New pt referral (has had ER visit since referral)  . Shortness of Breath    HPI: Andrea Stone is a 37 y.o. female with a PMH- HTN & HLD who presents today for evaluation of CP & SOB at the request of Bing Neighbors, FNP    - former PCP had d/c'ed BP med - still on statin.  Andrea Stone was last seen on 07/04/17 by Ms. Harris with plan for referral to Cardiology.  She noted atypical chest discomfort that occurs much for long period of time not necessarily worse with exertion.  But also noted to have exertional dyspnea.  The plan was to refer to cardiology.  No real benefit with ranitidine.  Recent Hospitalizations: hospital/ER - presenting with chest pain on July 16, 2017. -She noted off-and-on chest pain over the last week.  Also reported lightheadedness and dyspnea on exertion with some diaphoresis.  Felt to be low risk and discharged home after she ruled out for MI.  Studies Personally Reviewed - (if available, images/films reviewed: From Epic Chart or Care Everywhere)  No prior studies  Interval History: Andrea Stone presents today really with complaints of Sharp burning type pain in several areas across her chest, mostly along the sternal border, but also up underneath the breasts.  Not necessarily made worse with rest or exertion.  She also however notes continued exertional dyspnea but no PND, orthopnea or edema.  Off-and-on has some swelling but nothing significant. She has occasional palpitations but no rapid irregular heartbeats.  No syncope/near syncope or TIA/amaurosis fugax.  No melena, hematochezia, hematuria, or epstaxis. No claudication.  ROS: A comprehensive was performed. Review of Systems  Constitutional: Positive for diaphoresis (With chest pain shortness of breath). Negative for malaise/fatigue.  HENT: Negative for congestion and nosebleeds.   Respiratory: Positive  for cough. Negative for sputum production and shortness of breath.   Gastrointestinal: Positive for blood in stool, heartburn (No benefit from Zantac) and melena.  Genitourinary: Positive for hematuria.  Neurological: Negative for dizziness.  Psychiatric/Behavioral: The patient is nervous/anxious.   All other systems reviewed and are negative.  I have reviewed and (if needed) personally updated the patient's problem list, medications, allergies, past medical and surgical history, social and family history.   Past Medical History:  Diagnosis Date  . Hyperlipidemia   . Hypertension   . Trichomoniasis   . UTI (urinary tract infection) during pregnancy     Past Surgical History:  Procedure Laterality Date  . LAPAROSCOPIC TUBAL LIGATION Bilateral 02/06/2013   Procedure: LAPAROSCOPIC TUBAL LIGATION;  Surgeon: Kathreen Cosier, MD;  Location: [facility];  Service: Gynecology;  Laterality: Bilateral;  . WISDOM TOOTH EXTRACTION      Current Meds  Medication Sig  . aspirin EC 81 MG tablet Take 1 tablet (81 mg total) by mouth daily.  Marland Kitchen esomeprazole (NEXIUM) 20 MG capsule TAKE 1 CAPSULE DAILY AT LEAST 1 HOUR BEFORE A MEAL SWALLOWING WHOLE. DO NOT CRUSH/CHEW GRANULES.  Marland Kitchen metroNIDAZOLE (FLAGYL) 500 MG tablet Take 1 tablet (500 mg total) by mouth 3 (three) times daily.  . pravastatin (PRAVACHOL) 80 MG tablet Take 80 mg by mouth daily.    Allergies  Allergen Reactions  . Zantac [Ranitidine Hcl] Swelling and Rash    Social History   Tobacco Use  . Smoking status: Former Smoker    Packs/day: 0.25    Years: 13.00  Pack years: 3.25    Types: Cigarettes    Last attempt to quit: 05/27/2017    Years since quitting: 0.1  . Smokeless tobacco: Never Used  Substance Use Topics  . Alcohol use: Yes    Comment: socially  . Drug use: No   Social History   Social History Narrative   Disabled - (since childhood).    family history includes Asthma in her sister; Coronary artery disease in her  father; Heart failure in her maternal grandmother; Hypertension in her mother; Kidney disease in her maternal grandmother.  Wt Readings from Last 3 Encounters:  07/28/17 161 lb 9.6 oz (73.3 kg)  07/16/17 162 lb (73.5 kg)  07/04/17 163 lb (73.9 kg)    PHYSICAL EXAM BP 130/90 (BP Location: Left Arm, Patient Position: Sitting, Cuff Size: Normal)   Pulse 90   Ht 5\' 4"  (1.626 m)   Wt 161 lb 9.6 oz (73.3 kg)   LMP 07/22/2017 (Exact Date)   SpO2 100%   BMI 27.74 kg/m  Physical Exam  Constitutional: She is oriented to person, place, and time. She appears well-developed and well-nourished. No distress.  Mild truncal obesity but otherwise healthy appearing.  Well-groomed  HENT:  Head: Normocephalic and atraumatic.  Eyes: Conjunctivae and EOM are normal. Pupils are equal, round, and reactive to light.  Neck: Normal range of motion. Neck supple. No hepatojugular reflux and no JVD present. Carotid bruit is not present.  Cardiovascular: Normal rate, regular rhythm and intact distal pulses. Exam reveals no gallop and no friction rub.  No murmur heard. Pulmonary/Chest: Effort normal and breath sounds normal. No respiratory distress. She has no wheezes. She has no rales. She exhibits tenderness (Along sternal border).  Abdominal: Soft. Bowel sounds are normal. She exhibits no distension. There is no tenderness. There is no rebound.  Musculoskeletal: Normal range of motion. She exhibits edema.  Neurological: She is alert and oriented to person, place, and time. No cranial nerve deficit.  Skin: Skin is warm and dry. No erythema.  Psychiatric: She has a normal mood and affect. Her behavior is normal. Judgment and thought content normal.  Nursing note and vitals reviewed.    Adult ECG Report Not checked   Other studies Reviewed: Additional studies/ records that were reviewed today include:  Recent Labs:  Lab Results  Component Value Date   CHOL 159 07/04/2017   HDL 53 07/04/2017   LDLCALC 94  07/04/2017   TRIG 61 07/04/2017   CHOLHDL 3.0 07/04/2017   Lab Results  Component Value Date   CREATININE 0.73 07/16/2017   BUN 8 07/16/2017   NA 137 07/16/2017   K 3.4 (L) 07/16/2017   CL 107 07/16/2017   CO2 19 (L) 07/16/2017     ASSESSMENT / PLAN: Relatively young woman with family history of premature CAD who has a diagnosis of hypertension and hyperlipidemia, but not currently on medication.  She does have exertional dyspnea.  We will evaluate this with a 2D echocardiogram and cardiac pulmonary exercise test.  Her chest pain seems relatively atypical and noncardiac in nature.  We will evaluate this with her cardia pulmonary stress test which will help [place] delineate coronary ischemia but also help with the dyspnea.    Most recent lipid panel looks relatively well Controlled for her current risk factors.  If there is no evidence of CAD, this is pretty much at goal.    We will see her back after her studies to discuss results.   Problem List Items  Addressed This Visit    Chest pain with low risk for cardiac etiology   Relevant Orders   ECHOCARDIOGRAM COMPLETE   Cardiopulmonary exercise test   DOE (dyspnea on exertion) - Primary   Relevant Orders   ECHOCARDIOGRAM COMPLETE   Cardiopulmonary exercise test   Family history of premature coronary heart disease (Chronic)   Relevant Orders   ECHOCARDIOGRAM COMPLETE   Cardiopulmonary exercise test      Current medicines are reviewed at length with the patient today. (+/- concerns) n/a The following changes have been made: n/a  Patient Instructions  NO CHANGES WITH MEDICATIONS     SCHEDULE AT [facility]  Your physician has recommended that you have a cardiopulmonary stress test (CPX). CPX testing is a non-invasive measurement of heart and lung function. It replaces a traditional treadmill stress test. This type of test provides a tremendous amount of information that relates not only to your present condition but also for  future outcomes. This test combines measurements of you ventilation, respiratory gas exchange in the lungs, electrocardiogram (EKG), blood pressure and physical response before, during, and following an exercise protocol.  SCHEDULE AT [address] Your physician has requested that you have an echocardiogram. Echocardiography is a painless test that uses sound waves to create images of your heart. It provides your doctor with information about the size and shape of your heart and how well your heart's chambers and valves are working. This procedure takes approximately one hour. There are no restrictions for this procedure.     Your physician recommends that you schedule a follow-up appointment in 1 MONTH  WITH DR Jasime Westergren.        Cardiopulmonary Exercise Stress Test Cardiopulmonary exercise testing (CPET) is a test that checks how your heart and lungs react to exercise. This is called your exercise capacity. During this test, you will walk or run on a treadmill or pedal on a stationary bike while tests are done on your heart and lungs. You may have this test to:  See why you are short of breath.  Check for exercise intolerance.  See how your lungs work.  See how your heart works.  Check for how you are responding to a heart or lung rehabilitation program.  See if you have a heart or lung problem.  See if you are healthy enough to have surgery.  What happens before the procedure?  Follow instructions from your doctor about what you cannot eat or drink.  Ask your doctor about changing or stopping your normal medicines. This is important if you take diabetes medicines or blood thinners.  Wear loose, comfortable clothing and shoes.  If you use an inhaler, bring it with you to the test. What happens during the procedure?  A blood pressure cuff will be placed on your arm.  Several stick-on patches (electrodes) will be placed on your chest. They will be  attached to an electrocardiogram (EKG) machine.  A clip-on monitor that measures the amount of oxygen in your blood will be placed on your finger (pulse oximeter).  A clip will be placed on your nose and a mouthpiece will be placed in your mouth. This may be held in place with a headpiece. You will breathe through the mouthpiece during the test.  You will be asked to start exercising. You will be closely watched while you exercise.  The amount of effort for your exercise will be gradually increased.  During exercise, the test will measure: ?  Your heart rate. ? Your heart rhythm. ? Your oxygen blood level. ? The amount of oxygen and carbon dioxide that you breathe out.  The test will end when: ? You have finished the test. ? You have reached your maximum ability to exercise. ? You have chest or leg pain, dizziness, or shortness of breath. The procedure may vary among doctors and hospitals. What happens after the procedure?  Your blood pressure and EKG will be checked to watch how you recover from the test. This information is not intended to replace advice given to you by your health care provider. Make sure you discuss any questions you have with your health care provider. Document Released: 05/18/2009 Document Revised: 10/20/2015 Document Reviewed: 04/13/2015 Elsevier Interactive Patient Education  2018 [employer]       Studies Ordered:   Orders Placed This Encounter  Procedures  . Cardiopulmonary exercise test  . ECHOCARDIOGRAM COMPLETE      Bryan Lemmaavid Allex Madia, M.D., M.S. Interventional Cardiologist   Pager # [phone] Phone # [phone] [address] [place], [place] [address]   Thank you for choosing [facility]!!

## 2017-07-28 NOTE — Patient Instructions (Addendum)
NO CHANGES WITH MEDICATIONS     SCHEDULE AT [facility]  Your physician has recommended that you have a cardiopulmonary stress test (CPX). CPX testing is a non-invasive measurement of heart and lung function. It replaces a traditional treadmill stress test. This type of test provides a tremendous amount of information that relates not only to your present condition but also for future outcomes. This test combines measurements of you ventilation, respiratory gas exchange in the lungs, electrocardiogram (EKG), blood pressure and physical response before, during, and following an exercise protocol.  SCHEDULE AT [address] Your physician has requested that you have an echocardiogram. Echocardiography is a painless test that uses sound waves to create images of your heart. It provides your doctor with information about the size and shape of your heart and how well your heart's chambers and valves are working. This procedure takes approximately one hour. There are no restrictions for this procedure.     Your physician recommends that you schedule a follow-up appointment in 1 MONTH  WITH DR HARDING.        Cardiopulmonary Exercise Stress Test Cardiopulmonary exercise testing (CPET) is a test that checks how your heart and lungs react to exercise. This is called your exercise capacity. During this test, you will walk or run on a treadmill or pedal on a stationary bike while tests are done on your heart and lungs. You may have this test to:  See why you are short of breath.  Check for exercise intolerance.  See how your lungs work.  See how your heart works.  Check for how you are responding to a heart or lung rehabilitation program.  See if you have a heart or lung problem.  See if you are healthy enough to have surgery.  What happens before the procedure?  Follow instructions from your doctor about what you cannot eat or drink.  Ask your doctor about  changing or stopping your normal medicines. This is important if you take diabetes medicines or blood thinners.  Wear loose, comfortable clothing and shoes.  If you use an inhaler, bring it with you to the test. What happens during the procedure?  A blood pressure cuff will be placed on your arm.  Several stick-on patches (electrodes) will be placed on your chest. They will be attached to an electrocardiogram (EKG) machine.  A clip-on monitor that measures the amount of oxygen in your blood will be placed on your finger (pulse oximeter).  A clip will be placed on your nose and a mouthpiece will be placed in your mouth. This may be held in place with a headpiece. You will breathe through the mouthpiece during the test.  You will be asked to start exercising. You will be closely watched while you exercise.  The amount of effort for your exercise will be gradually increased.  During exercise, the test will measure: ? Your heart rate. ? Your heart rhythm. ? Your oxygen blood level. ? The amount of oxygen and carbon dioxide that you breathe out.  The test will end when: ? You have finished the test. ? You have reached your maximum ability to exercise. ? You have chest or leg pain, dizziness, or shortness of breath. The procedure may vary among doctors and hospitals. What happens after the procedure?  Your blood pressure and EKG will be checked to watch how you recover from the test. This information is not intended to replace advice given to you by your health  care provider. Make sure you discuss any questions you have with your health care provider. Document Released: 05/18/2009 Document Revised: 10/20/2015 Document Reviewed: 04/13/2015 Elsevier Interactive Patient Education  2018 [employer]

## 2017-07-30 ENCOUNTER — Encounter: Payer: Self-pay | Admitting: Cardiology

## 2017-08-01 ENCOUNTER — Ambulatory Visit (HOSPITAL_COMMUNITY): Payer: Medicaid Other | Attending: Cardiology

## 2017-08-01 ENCOUNTER — Other Ambulatory Visit (HOSPITAL_COMMUNITY): Payer: Self-pay | Admitting: *Deleted

## 2017-08-01 DIAGNOSIS — Z8249 Family history of ischemic heart disease and other diseases of the circulatory system: Secondary | ICD-10-CM

## 2017-08-01 DIAGNOSIS — R079 Chest pain, unspecified: Secondary | ICD-10-CM | POA: Diagnosis not present

## 2017-08-01 DIAGNOSIS — R0609 Other forms of dyspnea: Secondary | ICD-10-CM | POA: Insufficient documentation

## 2017-08-07 ENCOUNTER — Ambulatory Visit (HOSPITAL_COMMUNITY): Payer: Medicaid Other

## 2017-08-09 ENCOUNTER — Other Ambulatory Visit: Payer: Self-pay

## 2017-08-09 ENCOUNTER — Ambulatory Visit (HOSPITAL_COMMUNITY): Payer: Medicaid Other | Attending: Cardiovascular Disease

## 2017-08-09 DIAGNOSIS — Z8249 Family history of ischemic heart disease and other diseases of the circulatory system: Secondary | ICD-10-CM | POA: Insufficient documentation

## 2017-08-09 DIAGNOSIS — R0600 Dyspnea, unspecified: Secondary | ICD-10-CM | POA: Diagnosis present

## 2017-08-09 DIAGNOSIS — I10 Essential (primary) hypertension: Secondary | ICD-10-CM | POA: Insufficient documentation

## 2017-08-09 DIAGNOSIS — R079 Chest pain, unspecified: Secondary | ICD-10-CM | POA: Insufficient documentation

## 2017-08-09 DIAGNOSIS — R0609 Other forms of dyspnea: Secondary | ICD-10-CM

## 2017-08-09 DIAGNOSIS — E785 Hyperlipidemia, unspecified: Secondary | ICD-10-CM | POA: Diagnosis not present

## 2017-08-09 NOTE — Progress Notes (Signed)
Echo results: Good news: Essentially normal echocardiogram and normal pump function and normal valve function.  No signs to suggest heart attack.. EF: 60-65%. No regional wall motion abnormalities  Bryan Lemmaavid Tung Pustejovsky, MD  pls fwd to PCP: Bing NeighborsHarris, Kimberly S, FNP

## 2017-08-25 ENCOUNTER — Ambulatory Visit: Payer: Medicaid Other | Admitting: Cardiology

## 2017-08-25 ENCOUNTER — Encounter: Payer: Self-pay | Admitting: Cardiology

## 2017-08-25 VITALS — BP 122/82 | HR 78 | Ht 64.0 in | Wt 164.0 lb

## 2017-08-25 DIAGNOSIS — R0609 Other forms of dyspnea: Secondary | ICD-10-CM | POA: Diagnosis not present

## 2017-08-25 DIAGNOSIS — Z8249 Family history of ischemic heart disease and other diseases of the circulatory system: Secondary | ICD-10-CM

## 2017-08-25 DIAGNOSIS — R079 Chest pain, unspecified: Secondary | ICD-10-CM

## 2017-08-25 NOTE — Progress Notes (Signed)
PCP: Bing Neighbors, FNP  Clinic Note: Chief Complaint  Patient presents with  . Follow-up    Echo and CPX for evaluation of chest pain    HPI: Andrea Stone is a 37 y.o. female with a PMH- HTN & HLD who presents today for evaluation of CP & SOB at the request of Bing Neighbors, FNP    - former PCP had d/c'ed BP med - still on statin.  Andrea Stone was last seen on 07/04/17 by Ms. Harris with plan for referral to Cardiology.  She noted atypical chest discomfort that occurs much for long period of time not necessarily worse with exertion.  But also noted to have exertional dyspnea.  The plan was to refer to cardiology.  No real benefit with ranitidine.  Recent Hospitalizations: hospital/ER - presenting with chest pain on July 16, 2017. -She noted off-and-on chest pain over the last week.  Also reported lightheadedness and dyspnea on exertion with some diaphoresis.  Felt to be low risk and discharged home after she ruled out for MI.  Studies Personally Reviewed - (if available, images/films reviewed: From Epic Chart or Care Everywhere)  2D echo normal -- EF 60-65%.  No regional WMA. No valvular lesions.  CPX result: No cardiopulmonary limitation seen. Peak VO2 normal for workload achieved on submaximal test.  Dyspnea most likely related to deconditioning & limitations due to weight. Normal Study from a Heart & Lung standpoint.  Interval History: Andrea Stone presents today still noting exertional dyspnea with just simply walking around and doing minor activities.  She denies any PND orthopnea symptoms to suggest heart failure.  She is not really having that much in the way of any chest pain just off and on nonexertional pains. Occasional palpitations but no rapid irregular heartbeats.  No syncope/near syncope or TIA/amaurosis fugax.  No claudication  ROS: A comprehensive was performed. Review of Systems  Constitutional: Negative for diaphoresis and malaise/fatigue.  HENT: Negative  for congestion and nosebleeds.   Respiratory: Positive for cough. Negative for sputum production and shortness of breath.   Gastrointestinal: Positive for heartburn (No benefit from Zantac). Negative for blood in stool and melena.  Genitourinary: Negative for hematuria.  Neurological: Negative for dizziness.  Psychiatric/Behavioral: The patient is nervous/anxious.   All other systems reviewed and are negative.  I have reviewed and (if needed) personally updated the patient's problem list, medications, allergies, past medical and surgical history, social and family history.   Past Medical History:  Diagnosis Date  . Hyperlipidemia   . Hypertension   . Trichomoniasis   . UTI (urinary tract infection) during pregnancy     Past Surgical History:  Procedure Laterality Date  . LAPAROSCOPIC TUBAL LIGATION Bilateral 02/06/2013   Procedure: LAPAROSCOPIC TUBAL LIGATION;  Surgeon: Kathreen Cosier, MD;  Location: [facility];  Service: Gynecology;  Laterality: Bilateral;  . WISDOM TOOTH EXTRACTION      Current Meds  Medication Sig  . aspirin EC 81 MG tablet Take 1 tablet (81 mg total) by mouth daily.  Marland Kitchen esomeprazole (NEXIUM) 20 MG capsule TAKE 1 CAPSULE DAILY AT LEAST 1 HOUR BEFORE A MEAL SWALLOWING WHOLE. DO NOT CRUSH/CHEW GRANULES.  . pravastatin (PRAVACHOL) 80 MG tablet Take 80 mg by mouth daily.    Allergies  Allergen Reactions  . Zantac [Ranitidine Hcl] Swelling and Rash    Social History   Tobacco Use  . Smoking status: Former Smoker    Packs/day: 0.25    Years: 13.00    Pack  years: 3.25    Types: Cigarettes    Last attempt to quit: 05/27/2017    Years since quitting: 0.2  . Smokeless tobacco: Never Used  Substance Use Topics  . Alcohol use: Yes    Comment: socially  . Drug use: No   Social History   Social History Narrative   Disabled - (since childhood).    family history includes Asthma in her sister; Coronary artery disease in her father; Heart failure in her  maternal grandmother; Hypertension in her mother; Kidney disease in her maternal grandmother.  Wt Readings from Last 3 Encounters:  08/25/17 164 lb (74.4 kg)  07/28/17 161 lb 9.6 oz (73.3 kg)  07/16/17 162 lb (73.5 kg)    PHYSICAL EXAM BP 122/82   Pulse 78   Ht 5\' 4"  (1.626 m)   Wt 164 lb (74.4 kg)   BMI 28.15 kg/m  Physical Exam  Constitutional: She is oriented to person, place, and time. She appears well-developed and well-nourished. No distress.  Mild truncal obesity but otherwise healthy appearing.  Well-groomed  HENT:  Head: Normocephalic and atraumatic.  Neck: Normal range of motion. Neck supple. No hepatojugular reflux and no JVD present. Carotid bruit is not present.  Cardiovascular: Normal rate, regular rhythm and intact distal pulses. Exam reveals no gallop and no friction rub.  No murmur heard. Pulmonary/Chest: Effort normal and breath sounds normal. No respiratory distress. She has no wheezes. She has no rales. She exhibits tenderness (Along sternal border).  Abdominal: Soft. Bowel sounds are normal. She exhibits no distension. There is no tenderness. There is no rebound.  Truncal obesity  Musculoskeletal: Normal range of motion. She exhibits edema (Trivial).  Neurological: She is alert and oriented to person, place, and time. No cranial nerve deficit.  Psychiatric: She has a normal mood and affect. Her behavior is normal. Judgment and thought content normal.  Nursing note and vitals reviewed.    Adult ECG Report Not checked   Other studies Reviewed: Additional studies/ records that were reviewed today include:  Recent Labs:  Lab Results  Component Value Date   CHOL 159 07/04/2017   HDL 53 07/04/2017   LDLCALC 94 07/04/2017   TRIG 61 07/04/2017   CHOLHDL 3.0 07/04/2017   Lab Results  Component Value Date   CREATININE 0.73 07/16/2017   BUN 8 07/16/2017   NA 137 07/16/2017   K 3.4 (L) 07/16/2017   CL 107 07/16/2017   CO2 19 (L) 07/16/2017      ASSESSMENT / PLAN:  Relatively young woman with family history of premature CAD who has a diagnosis of hypertension and hyperlipidemia who is being evaluated for exertional dyspnea.  Blood pressure is actually controlled without medication, but she is on pravastatin.  Relatively normal echocardiogram and CPX results.  Would argue for non-cardiac etiology (also likely not a pulmonary etiology.   Problem List Items Addressed This Visit    Family history of premature coronary heart disease (Chronic)    Relatively normal CPX.  She still has exertional dyspnea.  I agree with continuing statin for risk factor modification along with aspirin.  I will have her follow-up in 6 months if she is still symptomatic, we can consider coronary calcium score plus/minus coronary CTA.      DOE (dyspnea on exertion)    Essentially normal echocardiogram and normal CPX.  This would argue for a noncardiac etiology for dyspnea.  She also had a relatively normal PFT evaluation. Suspect her exertional dyspnea is probably related to  deconditioning.       Chest pain with low risk for cardiac etiology - Primary     Atypical type of chest discomfort, nonexertional.  She had a CPX during which she did not have any chest pain.  She had exertional dyspnea, but the CPX was essentially normal from a cardiac and pulmonary standpoint.  Would argue that exertional dyspnea is related to deconditioning and obesity.  Recommend diet and exercise with weight loss.  Continue to stay active and exercise.          Current medicines are reviewed at length with the patient today. (+/- concerns) n/a The following changes have been made: n/a  Patient Instructions  Medication Instructions: Your physician recommends that you continue on your current medications as directed. Please refer to the Current Medication list given to you today.  If you need a refill on your cardiac medications before your next appointment, please  call your pharmacy.   Follow-Up: Your physician wants you to follow-up in September with Dr. Herbie Baltimore.  You will receive a reminder letter in the mail two months in advance. If you don't receive a letter, please call our office at [phone] to schedule this follow-up appointment.   Special Instructions:  Please try and exercise 30-40 mins daily.  Thank you for choosing [facility]care at [facility]!!        Studies Ordered:   No orders of the defined types were placed in this encounter.     Bryan Lemma, M.D., M.S. Interventional Cardiologist   Pager # [phone] Phone # [phone] [address] [place], [place] [address]   Thank you for choosing Heartcare at [facility]!!

## 2017-08-25 NOTE — Patient Instructions (Signed)
Medication Instructions: Your physician recommends that you continue on your current medications as directed. Please refer to the Current Medication list given to you today.  If you need a refill on your cardiac medications before your next appointment, please call your pharmacy.   Follow-Up: Your physician wants you to follow-up in September with Dr. Herbie BaltimoreHarding.  You will receive a reminder letter in the mail two months in advance. If you don't receive a letter, please call our office at [phone] to schedule this follow-up appointment.   Special Instructions:  Please try and exercise 30-40 mins daily.  Thank you for choosing [facility] at [facility]!!

## 2017-08-28 NOTE — Assessment & Plan Note (Signed)
Atypical type of chest discomfort, nonexertional.  She had a CPX during which she did not have any chest pain.  She had exertional dyspnea, but the CPX was essentially normal from a cardiac and pulmonary standpoint.  Would argue that exertional dyspnea is related to deconditioning and obesity.  Recommend diet and exercise with weight loss.  Continue to stay active and exercise.

## 2017-08-28 NOTE — Assessment & Plan Note (Signed)
Relatively normal CPX.  She still has exertional dyspnea.  I agree with continuing statin for risk factor modification along with aspirin.  I will have her follow-up in 6 months if she is still symptomatic, we can consider coronary calcium score plus/minus coronary CTA.

## 2017-08-28 NOTE — Assessment & Plan Note (Signed)
Essentially normal echocardiogram and normal CPX.  This would argue for a noncardiac etiology for dyspnea.  She also had a relatively normal PFT evaluation. Suspect her exertional dyspnea is probably related to deconditioning.

## 2017-09-07 NOTE — Telephone Encounter (Signed)
Note not needed 

## 2017-10-04 ENCOUNTER — Other Ambulatory Visit: Payer: Self-pay | Admitting: Family Medicine

## 2018-01-01 ENCOUNTER — Ambulatory Visit: Payer: Medicaid Other | Admitting: Family Medicine

## 2018-01-10 ENCOUNTER — Encounter: Payer: Self-pay | Admitting: Family Medicine

## 2018-01-10 ENCOUNTER — Ambulatory Visit (INDEPENDENT_AMBULATORY_CARE_PROVIDER_SITE_OTHER): Payer: Medicaid Other | Admitting: Family Medicine

## 2018-01-10 VITALS — BP 106/68 | HR 77 | Temp 97.9°F | Ht 64.0 in | Wt 158.0 lb

## 2018-01-10 DIAGNOSIS — R8290 Unspecified abnormal findings in urine: Secondary | ICD-10-CM

## 2018-01-10 DIAGNOSIS — Z131 Encounter for screening for diabetes mellitus: Secondary | ICD-10-CM | POA: Diagnosis not present

## 2018-01-10 DIAGNOSIS — Z09 Encounter for follow-up examination after completed treatment for conditions other than malignant neoplasm: Secondary | ICD-10-CM

## 2018-01-10 DIAGNOSIS — N390 Urinary tract infection, site not specified: Secondary | ICD-10-CM | POA: Diagnosis not present

## 2018-01-10 DIAGNOSIS — E785 Hyperlipidemia, unspecified: Secondary | ICD-10-CM | POA: Diagnosis not present

## 2018-01-10 DIAGNOSIS — Z0000 Encounter for general adult medical examination without abnormal findings: Secondary | ICD-10-CM

## 2018-01-10 DIAGNOSIS — K219 Gastro-esophageal reflux disease without esophagitis: Secondary | ICD-10-CM

## 2018-01-10 LAB — POCT URINALYSIS DIP (MANUAL ENTRY)
Bilirubin, UA: NEGATIVE
Blood, UA: NEGATIVE
Glucose, UA: NEGATIVE mg/dL
Ketones, POC UA: NEGATIVE mg/dL
Nitrite, UA: NEGATIVE
Protein Ur, POC: NEGATIVE mg/dL
Spec Grav, UA: 1.02 (ref 1.010–1.025)
Urobilinogen, UA: 0.2 E.U./dL
pH, UA: 6 (ref 5.0–8.0)

## 2018-01-10 LAB — POCT GLYCOSYLATED HEMOGLOBIN (HGB A1C): Hemoglobin A1C: 5.6 % (ref 4.0–5.6)

## 2018-01-10 MED ORDER — ESOMEPRAZOLE MAGNESIUM 20 MG PO CPDR: DELAYED_RELEASE_CAPSULE | ORAL | 2 refills | Status: DC

## 2018-01-10 MED ORDER — SULFAMETHOXAZOLE-TRIMETHOPRIM 800-160 MG PO TABS: 1.0000 | ORAL_TABLET | Freq: Two times a day (BID) | ORAL | 0 refills | Status: DC

## 2018-01-10 NOTE — Progress Notes (Signed)
Follow Up  Subjective:    Patient ID: Andrea Stone, female    DOB: February 22, 1981, 37 y.o.   MRN: [id number]   Chief Complaint  Patient presents with  . Follow-up    6 month on health maintence  . Gastroesophageal Reflux  . Insomnia    HPI  Andrea Stone has a past medical history of Hypertension, and Hyperlipidemia.  Current Status: Since her last office visit, she is doing well with no complaints. She has mild acid reflux and states that she has not been taking her Nexium because she did not have any refills.   She reports mild lower back pain today.   She is inquiring about possibly restarting Depo Provera Contraception.  She denies fevers, chills, fatigue, recent infections, weight loss, and night sweats. She has not had any headaches, visual changes, dizziness, and falls. No chest pain, heart palpitations, cough and shortness of breath reported. No reports of GI problems such as nausea, vomiting, diarrhea, and constipation. She has no reports of blood in stools, dysuria and hematuria. No depression or anxiety, and denies suicidal ideations, homicidal ideations, or auditory hallucinations.   Past Medical History:  Diagnosis Date  . Hyperlipidemia   . Hypertension   . Trichomoniasis   . UTI (urinary tract infection) during pregnancy     Family History  Problem Relation Age of Onset  . Kidney disease Maternal Grandmother   . Heart failure Maternal Grandmother   . Hypertension Mother   . Asthma Sister   . Coronary artery disease Father        CABG - in 86s.  . Anesthesia problems Neg Hx   . Other Neg Hx     Social History   Socioeconomic History  . Marital status: Single    Spouse name: Not on file  . Number of children: 3  . Years of education: Not on file  . Highest education level: High school graduate  Occupational History  . Not on file  Social Needs  . Financial resource strain: Not on file  . Food insecurity:    Worry: Not on file    Inability: Not on file   . Transportation needs:    Medical: Not on file    Non-medical: Not on file  Tobacco Use  . Smoking status: Former Smoker    Packs/day: 0.25    Years: 13.00    Pack years: 3.25    Types: Cigarettes    Last attempt to quit: 05/27/2017    Years since quitting: 0.6  . Smokeless tobacco: Never Used  Substance and Sexual Activity  . Alcohol use: Yes    Comment: socially  . Drug use: No  . Sexual activity: Yes    Birth control/protection: None  Lifestyle  . Physical activity:    Days per week: Not on file    Minutes per session: Not on file  . Stress: Not on file  Relationships  . Social connections:    Talks on phone: Not on file    Gets together: Not on file    Attends religious service: Not on file    Active member of club or organization: Not on file    Attends meetings of clubs or organizations: Not on file    Relationship status: Not on file  . Intimate partner violence:    Fear of current or ex partner: Not on file    Emotionally abused: Not on file    Physically abused: Not on file    Forced  sexual activity: Not on file  Other Topics Concern  . Not on file  Social History Narrative   Disabled - (since childhood).    Past Surgical History:  Procedure Laterality Date  . LAPAROSCOPIC TUBAL LIGATION Bilateral 02/06/2013   Procedure: LAPAROSCOPIC TUBAL LIGATION;  Surgeon: Kathreen CosierBernard A Marshall, MD;  Location: [facility];  Service: Gynecology;  Laterality: Bilateral;  . WISDOM TOOTH EXTRACTION     Immunization History  Administered Date(s) Administered  . Influenza,inj,Quad PF,6+ Mos 06/20/2017  . Tdap 01/24/2012, 11/15/2012    Current Meds  Medication Sig  . aspirin 81 MG EC tablet TAKE 1 TABLET BY MOUTH EVERY DAY  . pravastatin (PRAVACHOL) 80 MG tablet Take 80 mg by mouth daily.   Allergies  Allergen Reactions  . Zantac [Ranitidine Hcl] Swelling and Rash    BP 106/68 (BP Location: Left Arm, Patient Position: Sitting, Cuff Size: Small)   Pulse 77   Temp 97.9  F (36.6 C) (Oral)   Ht 5\' 4"  (1.626 m)   Wt 158 lb (71.7 kg)   LMP 12/06/2017   SpO2 99%   BMI 27.12 kg/m   Review of Systems  Constitutional: Negative.   HENT: Negative.   Eyes: Negative.   Respiratory: Negative.   Cardiovascular: Negative.   Gastrointestinal: Positive for abdominal distention (Obese).       Acid Reflux  Endocrine: Negative.   Genitourinary: Negative.   Musculoskeletal: Positive for back pain (Mild).  Skin: Negative.   Allergic/Immunologic: Negative.   Neurological: Negative.   Hematological: Negative.   Psychiatric/Behavioral: Negative.    Objective:   Physical Exam  Constitutional: She is oriented to person, place, and time. She appears well-developed and well-nourished.  HENT:  Head: Normocephalic and atraumatic.  Right Ear: External ear normal.  Left Ear: External ear normal.  Nose: Nose normal.  Mouth/Throat: Oropharynx is clear and moist.  Eyes: Pupils are equal, round, and reactive to light. Conjunctivae and EOM are normal.  Neck: Normal range of motion. Neck supple.  Cardiovascular: Normal rate, regular rhythm, normal heart sounds and intact distal pulses.  Pulmonary/Chest: Effort normal and breath sounds normal.  Abdominal: Soft. Bowel sounds are normal. She exhibits distension.  Musculoskeletal:  Limited ROM in back.  Neurological: She is alert and oriented to person, place, and time.  Skin: Skin is warm and dry. Capillary refill takes less than 2 seconds.  Psychiatric: She has a normal mood and affect. Her behavior is normal. Judgment and thought content normal.  Nursing note and vitals reviewed.  Assessment & Plan:   1. Hyperlipidemia, unspecified hyperlipidemia type Most recent Lipid panel on 07/04/2017 was stable. We will re-assess today.   2. Gastroesophageal reflux disease without esophagitis Stable. Continue Nexium as prescribed.  - esomeprazole (NEXIUM) 20 MG capsule; TAKE 1 CAPSULE DAILY AT LEAST 1 HOUR BEFORE A MEAL  SWALLOWING WHOLE. DO NOT CRUSH/CHEW GRANULES.  Dispense: 30 capsule; Refill: 2  3. Screening for diabetes mellitus Hgb A1c is within normal range today at 5.6.   She will continue to decrease foods/beverages high in sugars and carbs and follow Heart Healthy or DASH diet. Increase physical activity to at least 30 minutes cardio exercise daily.   -POCT glycosylated hemoglobin (Hgb A1C) - POCT urinalysis dipstick  4. Urinary tract infection without hematuria, site unspecified - sulfamethoxazole-trimethoprim (BACTRIM DS,SEPTRA DS) 800-160 MG tablet; Take 1 tablet by mouth 2 (two) times daily.  Dispense: 14 tablet; Refill: 0  5. Abnormal urinalysis - Urine Culture  6. Increased storage iron She  has history of iron overload. We will re-evaluated Iron Studies today. If within normal range, we will initiate her on Depo Provera.  - Iron and TIBC([facility])  7. Healthcare maintenance - CBC with Differential - Comprehensive metabolic panel - TSH - Lipid Panel  8. Follow up She will follow up in 2 months.   Meds ordered this encounter  Medications  . sulfamethoxazole-trimethoprim (BACTRIM DS,SEPTRA DS) 800-160 MG tablet    Sig: Take 1 tablet by mouth 2 (two) times daily.    Dispense:  14 tablet    Refill:  0  . esomeprazole (NEXIUM) 20 MG capsule    Sig: TAKE 1 CAPSULE DAILY AT LEAST 1 HOUR BEFORE A MEAL SWALLOWING WHOLE. DO NOT CRUSH/CHEW GRANULES.    Dispense:  30 capsule    Refill:  2    Raliegh Ip,  MSN, FNP-C [facility] [address] [place], [place] [address] [phone]

## 2018-01-10 NOTE — Patient Instructions (Signed)
Sulfamethoxazole; Trimethoprim, SMX-TMP oral suspension What is this medicine? SULFAMETHOXAZOLE; TRIMETHOPRIM or SMX-TMP (suhl fuh meth OK suh zohl; trye METH oh prim) is a combination of a sulfonamide antibiotic and a second antibiotic, trimethoprim. It is used to treat or prevent certain kinds of bacterial infections.It will not work for colds, flu, or other viral infections. This medicine may be used for other purposes; ask your health care provider or pharmacist if you have questions. COMMON BRAND NAME(S): Septra, Sulfatrim, Sulfatrim Pediatric, Sultrex Pediatric What should I tell my health care provider before I take this medicine? They need to know if you have any of these conditions: -anemia -asthma -being treated with anticonvulsants -if you frequently drink alcohol containing drinks -kidney disease -liver disease -low level of folic acid or glucose-6-phosphate dehydrogenase -poor nutrition or malabsorption -porphyria -severe allergies -thyroid disorder -an unusual or allergic reaction to sulfamethoxazole, trimethoprim, sulfa drugs, other medicines, foods, dyes, or preservatives -pregnant or trying to get pregnant -breast-feeding How should I use this medicine? Take this suspension by mouth. Follow the directions on the prescription label. Shake the bottle well before taking. Use a specially marked spoon or container to measure your medicine. Ask your pharmacist if you do not have one. Household spoons are not accurate. Take your doses at regular intervals. Do not take more medicine than directed. Talk to your pediatrician regarding the use of this medicine in children. Special care may be needed. While this drug may be prescribed for children as young as 2 months of age for selected conditions, precautions do apply. Overdosage: If you think you have taken too much of this medicine contact a poison control center or emergency room at once. NOTE: This medicine is only for you. Do not  share this medicine with others. What if I miss a dose? If you miss a dose, take it as soon as you can. If it is almost time for your next dose, take only that dose. Do not take double or extra doses. What may interact with this medicine? Do not take this medicine with any of the following medications -aminobenzoate potassium -dofetilide -metronidazole This medicine may also interact with the following medications -ACE inhibitors like benazepril, enalapril, lisinopril, and ramipril -birth control pills -cyclosporine -digoxin -diuretics -indomethacin -medicines for diabetes -methenamine -methotrexate -phenytoin -potassium supplements -pyrimethamine -sulfinpyrazone -tricyclic antidepressants -warfarin This list may not describe all possible interactions. Give your health care provider a list of all the medicines, herbs, non-prescription drugs, or dietary supplements you use. Also tell them if you smoke, drink alcohol, or use illegal drugs. Some items may interact with your medicine. What should I watch for while using this medicine? Tell your doctor or health care professional if your symptoms do not improve. Drink several glasses of water a day to reduce the risk of kidney problems. Do not treat diarrhea with over the counter products. Contact your doctor if you have diarrhea that lasts more than 2 days or if it is severe and watery. This medicine can make you more sensitive to the sun. Keep out of the sun. If you cannot avoid being in the sun, wear protective clothing and use a sunscreen. Do not use sun lamps or tanning beds/booths. What side effects may I notice from receiving this medicine? Side effects that you should report to your doctor or health care professional as soon as possible: -allergic reactions like skin rash or hives, swelling of the face, lips, or tongue -breathing problems -fever or chills, sore throat -irregular heartbeat, chest pain -  joint or muscle pain -pain  or difficulty passing urine -red pinpoint spots on skin -redness, blistering, peeling or loosening of the skin, including inside the mouth -unusual bleeding or bruising -unusual weakness or tiredness -yellowing of the eyes or skin Side effects that usually do not require medical attention (report to your doctor or health care professional if they continue or are bothersome): -diarrhea -dizziness -headache -loss of appetite -nausea, vomiting -nervousness This list may not describe all possible side effects. Call your doctor for medical advice about side effects. You may report side effects to FDA at 1-800-FDA-1088. Where should I keep my medicine? Keep out of the reach of children. Store at room temperature between 15 and 25 degrees C (59 and 77 degrees F). Protect from light and moisture. Throw away any unused medicine after the expiration date. NOTE: This sheet is a summary. It may not cover all possible information. If you have questions about this medicine, talk to your doctor, pharmacist, or health care provider.  2018 Elsevier/Gold Standard (2013-01-04 14:37:40)  

## 2018-01-11 LAB — CBC WITH DIFFERENTIAL/PLATELET
Basophils Absolute: 0 10*3/uL (ref 0.0–0.2)
Basos: 0 %
EOS (ABSOLUTE): 0.2 10*3/uL (ref 0.0–0.4)
Eos: 3 %
Hematocrit: 37.2 % (ref 34.0–46.6)
Hemoglobin: 11.7 g/dL (ref 11.1–15.9)
Immature Grans (Abs): 0 10*3/uL (ref 0.0–0.1)
Immature Granulocytes: 0 %
Lymphocytes Absolute: 3.1 10*3/uL (ref 0.7–3.1)
Lymphs: 42 %
MCH: 26 pg — ABNORMAL LOW (ref 26.6–33.0)
MCHC: 31.5 g/dL (ref 31.5–35.7)
MCV: 83 fL (ref 79–97)
Monocytes Absolute: 0.7 10*3/uL (ref 0.1–0.9)
Monocytes: 9 %
Neutrophils Absolute: 3.3 10*3/uL (ref 1.4–7.0)
Neutrophils: 46 %
Platelets: 382 10*3/uL (ref 150–450)
RBC: 4.5 x10E6/uL (ref 3.77–5.28)
RDW: 15.3 % (ref 12.3–15.4)
WBC: 7.2 10*3/uL (ref 3.4–10.8)

## 2018-01-11 LAB — COMPREHENSIVE METABOLIC PANEL
ALT: 16 IU/L (ref 0–32)
AST: 15 IU/L (ref 0–40)
Albumin/Globulin Ratio: 1.4 (ref 1.2–2.2)
Albumin: 4.1 g/dL (ref 3.5–5.5)
Alkaline Phosphatase: 59 IU/L (ref 39–117)
BUN/Creatinine Ratio: 13 (ref 9–23)
BUN: 11 mg/dL (ref 6–20)
Bilirubin Total: 0.3 mg/dL (ref 0.0–1.2)
CO2: 22 mmol/L (ref 20–29)
Calcium: 9.2 mg/dL (ref 8.7–10.2)
Chloride: 102 mmol/L (ref 96–106)
Creatinine, Ser: 0.88 mg/dL (ref 0.57–1.00)
GFR calc Af Amer: 98 mL/min/{1.73_m2} (ref >59)
GFR calc non Af Amer: 85 mL/min/{1.73_m2} (ref >59)
Globulin, Total: 2.9 g/dL (ref 1.5–4.5)
Glucose: 84 mg/dL (ref 65–99)
Potassium: 4.2 mmol/L (ref 3.5–5.2)
Sodium: 137 mmol/L (ref 134–144)
Total Protein: 7 g/dL (ref 6.0–8.5)

## 2018-01-11 LAB — LIPID PANEL
Chol/HDL Ratio: 5.4 ratio — ABNORMAL HIGH (ref 0.0–4.4)
Cholesterol, Total: 207 mg/dL — ABNORMAL HIGH (ref 100–199)
HDL: 38 mg/dL — ABNORMAL LOW (ref >39)
LDL Calculated: 152 mg/dL — ABNORMAL HIGH (ref 0–99)
Triglycerides: 85 mg/dL (ref 0–149)
VLDL Cholesterol Cal: 17 mg/dL (ref 5–40)

## 2018-01-11 LAB — IRON AND TIBC
Iron Saturation: 9 % — CL (ref 15–55)
Iron: 37 ug/dL (ref 27–159)
Total Iron Binding Capacity: 427 ug/dL (ref 250–450)
UIBC: 390 ug/dL (ref 131–425)

## 2018-01-11 LAB — TSH: TSH: 0.671 u[IU]/mL (ref 0.450–4.500)

## 2018-01-12 ENCOUNTER — Telehealth: Payer: Self-pay

## 2018-01-12 LAB — URINE CULTURE

## 2018-01-12 NOTE — Telephone Encounter (Signed)
Patient notified

## 2018-01-12 NOTE — Telephone Encounter (Signed)
-----   Message from Kallie LocksNatalie M Stroud, FNP sent at 01/11/2018  5:32 PM EDT ----- Regarding: "Lab Results" Andrea Stone,   Please contact patient to let her know that labs were stable.   Her Cholesterol is mildly elevated.   She will continue to decrease high sodium intake, excessive alcohol intake, increase potassium intake, smoking cessation, and increase physical activity of at least 30 minutes of cardio activity daily. She will continue to follow Heart Healthy or DASH diet.  Thank you.

## 2018-01-21 ENCOUNTER — Other Ambulatory Visit: Payer: Self-pay | Admitting: Family Medicine

## 2018-01-21 DIAGNOSIS — E785 Hyperlipidemia, unspecified: Secondary | ICD-10-CM

## 2018-01-21 MED ORDER — PRAVASTATIN SODIUM 80 MG PO TABS: 80.0000 mg | ORAL_TABLET | Freq: Every day | ORAL | 3 refills | Status: DC

## 2018-01-26 ENCOUNTER — Other Ambulatory Visit: Payer: Self-pay | Admitting: Family Medicine

## 2018-01-26 ENCOUNTER — Telehealth: Payer: Self-pay | Admitting: Family Medicine

## 2018-01-26 DIAGNOSIS — D509 Iron deficiency anemia, unspecified: Secondary | ICD-10-CM

## 2018-01-26 NOTE — Telephone Encounter (Signed)
Iron Saturations are low. Patient to make appointment for CBC and TIBC to re-evaluate. Patient verbalized understanding.

## 2018-02-16 ENCOUNTER — Ambulatory Visit: Payer: Medicaid Other | Admitting: Family Medicine

## 2018-03-02 ENCOUNTER — Ambulatory Visit: Payer: Medicaid Other | Admitting: Cardiology

## 2018-03-05 ENCOUNTER — Encounter: Payer: Self-pay | Admitting: *Deleted

## 2018-03-13 ENCOUNTER — Ambulatory Visit (INDEPENDENT_AMBULATORY_CARE_PROVIDER_SITE_OTHER): Payer: Medicaid Other | Admitting: Orthopaedic Surgery

## 2018-03-13 DIAGNOSIS — D649 Anemia, unspecified: Secondary | ICD-10-CM

## 2018-03-13 DIAGNOSIS — D509 Iron deficiency anemia, unspecified: Secondary | ICD-10-CM

## 2018-03-13 HISTORY — DX: Iron deficiency anemia, unspecified: D50.9

## 2018-03-13 HISTORY — DX: Anemia, unspecified: D64.9

## 2018-03-21 ENCOUNTER — Ambulatory Visit: Payer: Medicaid Other | Admitting: Podiatry

## 2018-03-21 ENCOUNTER — Ambulatory Visit (INDEPENDENT_AMBULATORY_CARE_PROVIDER_SITE_OTHER): Payer: Medicaid Other

## 2018-03-21 ENCOUNTER — Encounter: Payer: Self-pay | Admitting: Podiatry

## 2018-03-21 VITALS — BP 123/85 | HR 74

## 2018-03-21 DIAGNOSIS — M778 Other enthesopathies, not elsewhere classified: Secondary | ICD-10-CM

## 2018-03-21 DIAGNOSIS — M7751 Other enthesopathy of right foot: Secondary | ICD-10-CM | POA: Diagnosis not present

## 2018-03-21 DIAGNOSIS — M779 Enthesopathy, unspecified: Principal | ICD-10-CM

## 2018-03-24 NOTE — Progress Notes (Signed)
   HPI: 37 year old female presenting today as a new patient with a chief complaint of a painful nodule located on the dorsum of the right foot that has been present for the past year. She reports associated sharp pain to the area that is increased with walking. She denies any injury. She has not done anything for treatment. Patient is here for further evaluation and treatment.   Past Medical History:  Diagnosis Date  . Hyperlipidemia   . Hypertension   . Trichomoniasis   . UTI (urinary tract infection) during pregnancy      Physical Exam: General: The patient is alert and oriented x3 in no acute distress.  Dermatology: Skin is warm, dry and supple bilateral lower extremities. Negative for open lesions or macerations.  Vascular: Palpable pedal pulses bilaterally. No edema or erythema noted. Capillary refill within normal limits.  Neurological: Epicritic and protective threshold grossly intact bilaterally.   Musculoskeletal Exam: Pain with palpation to the extensor tendons of the right foot. Range of motion within normal limits to all pedal and ankle joints bilateral. Muscle strength 5/5 in all groups bilateral.   Radiographic Exam:  Normal osseous mineralization. Joint spaces preserved. No fracture/dislocation/boney destruction.    Assessment: 1. Extensor tendinitis right    Plan of Care:  1. Patient evaluated. X-Rays reviewed.  2. Injection of 0.5 mLs Celestone Soluspan injected into the extensor tendon sheath of the left foot.  3. Recommended shoes that are not tight across the top of the foot.  4. Offloading donut pads dispensed.  5. Return to clinic as needed.      Felecia Shelling, DPM [facility]  Dr. Felecia Shelling, DPM    [address] [place], [place] [address]                Office [phone]  Fax [phone]

## 2018-03-28 ENCOUNTER — Encounter: Payer: Self-pay | Admitting: Family Medicine

## 2018-03-28 ENCOUNTER — Ambulatory Visit (INDEPENDENT_AMBULATORY_CARE_PROVIDER_SITE_OTHER): Payer: Medicaid Other | Admitting: Family Medicine

## 2018-03-28 VITALS — BP 116/68 | HR 64 | Temp 97.9°F | Ht 64.0 in | Wt 157.6 lb

## 2018-03-28 DIAGNOSIS — N390 Urinary tract infection, site not specified: Secondary | ICD-10-CM

## 2018-03-28 DIAGNOSIS — D509 Iron deficiency anemia, unspecified: Secondary | ICD-10-CM | POA: Diagnosis not present

## 2018-03-28 DIAGNOSIS — K219 Gastro-esophageal reflux disease without esophagitis: Secondary | ICD-10-CM

## 2018-03-28 DIAGNOSIS — E785 Hyperlipidemia, unspecified: Secondary | ICD-10-CM | POA: Diagnosis not present

## 2018-03-28 DIAGNOSIS — Z09 Encounter for follow-up examination after completed treatment for conditions other than malignant neoplasm: Secondary | ICD-10-CM

## 2018-03-28 DIAGNOSIS — G43819 Other migraine, intractable, without status migrainosus: Secondary | ICD-10-CM

## 2018-03-28 DIAGNOSIS — R8290 Unspecified abnormal findings in urine: Secondary | ICD-10-CM

## 2018-03-28 LAB — POCT URINALYSIS DIP (MANUAL ENTRY)
Bilirubin, UA: NEGATIVE
Blood, UA: NEGATIVE
Glucose, UA: NEGATIVE mg/dL
Ketones, POC UA: NEGATIVE mg/dL
Nitrite, UA: NEGATIVE
Protein Ur, POC: NEGATIVE mg/dL
Spec Grav, UA: 1.02 (ref 1.010–1.025)
Urobilinogen, UA: 0.2 E.U./dL
pH, UA: 6.5 (ref 5.0–8.0)

## 2018-03-28 MED ORDER — TOPIRAMATE 50 MG PO TABS: 50.0000 mg | ORAL_TABLET | Freq: Every day | ORAL | 1 refills | Status: DC

## 2018-03-28 MED ORDER — TOPIRAMATE 25 MG PO TABS: 25.0000 mg | ORAL_TABLET | Freq: Two times a day (BID) | ORAL | 1 refills | Status: DC

## 2018-03-28 MED ORDER — SULFAMETHOXAZOLE-TRIMETHOPRIM 800-160 MG PO TABS
1.0000 | ORAL_TABLET | Freq: Two times a day (BID) | ORAL | 0 refills | Status: DC
Start: 2018-03-28 — End: 2018-03-29

## 2018-03-28 NOTE — Progress Notes (Signed)
Follow Up  Subjective:    Patient ID: Andrea Stone, female    DOB: May 25, 1981, 37 y.o.   MRN: [id number]   Chief Complaint  Patient presents with  . Follow-up    health maintence    HPI  Andrea Stone is a 35 female with a past medical history of UTI, Trichomoniasis, Hypertension, and Hyperlipidemia. She is here today for follow up.    Current Status: Since her last office visit, she is doing well with no complaints. She continues follow ups and has an appointment with [facility] for arthritis in her back and legs.   She denies fevers, chills, fatigue, recent infections, weight loss, and night sweats. She has not had any headaches, visual changes, dizziness, and falls. No chest pain, heart palpitations, cough and shortness of breath reported. No reports of GI problems such as nausea, vomiting, diarrhea, and constipation. She has no reports of blood in stools, dysuria and hematuria. No depression or anxiety, and denies suicidal ideations, homicidal ideations, or auditory hallucinations. She denies pain today.   Review of Systems  Constitutional: Negative.   HENT: Negative.   Respiratory: Negative.   Gastrointestinal: Negative.   Genitourinary: Negative.   Musculoskeletal: Positive for arthralgias (Generalized; Legs) and back pain (arthritis. ).  Neurological: Positive for headaches (Migraines).  Psychiatric/Behavioral: Negative.    Objective:   Physical Exam  Constitutional: She is oriented to person, place, and time. She appears well-developed and well-nourished.  HENT:  Head: Normocephalic and atraumatic.  Right Ear: External ear normal.  Left Ear: External ear normal.  Nose: Nose normal.  Mouth/Throat: Oropharynx is clear and moist.  Eyes: Pupils are equal, round, and reactive to light. Conjunctivae and EOM are normal.  Neck: Normal range of motion. Neck supple.  Cardiovascular: Normal rate, regular rhythm, normal heart sounds and intact distal pulses.   Pulmonary/Chest: Effort normal and breath sounds normal.  Abdominal: Soft. Bowel sounds are normal.  Musculoskeletal: Normal range of motion.  Neurological: She is alert and oriented to person, place, and time.  Skin: Skin is warm and dry.  Psychiatric: She has a normal mood and affect. Her behavior is normal. Judgment and thought content normal.  Nursing note and vitals reviewed.  Assessment & Plan:   1. Other migraine without status migrainosus, intractable We will initiate Topamax today.  - topiramate (TOPAMAX) 25 MG tablet; Take 1 tablet (25 mg total) by mouth 2 (two) times daily.  Dispense: 60 tablet; Refill: 1  2. Iron deficiency anemia, unspecified iron deficiency anemia type Hgb within normal levels at 11.7.  - Iron and TIBC([facility]) - CBC with Differential  3. Hyperlipidemia, unspecified hyperlipidemia type Lipid level stable on 01/10/2018. Continue Pravastatin as prescribed.   4. Gastroesophageal reflux disease without esophagitis Nexium is effective. Continue as prescribed.   5. Iron overload Iron levels within normal range 01/10/2018. We will re-assess labs today.   6. Abnormal urinalysis - Urine Culture  7. Urinary tract infection without hematuria, site unspecified - sulfamethoxazole-trimethoprim (BACTRIM DS,SEPTRA DS) 800-160 MG tablet; Take 1 tablet by mouth 2 (two) times daily.  Dispense: 14 tablet; Refill: 0  8. Follow up She will follow up in 6 weeks to assess effectiveness of Topamax.  - POCT urinalysis dipstick  Meds ordered this encounter  Medications  . sulfamethoxazole-trimethoprim (BACTRIM DS,SEPTRA DS) 800-160 MG tablet    Sig: Take 1 tablet by mouth 2 (two) times daily.    Dispense:  14 tablet    Refill:  0  . DISCONTD: topiramate (TOPAMAX)  50 MG tablet    Sig: Take 1 tablet (50 mg total) by mouth daily.    Dispense:  30 tablet    Refill:  1  . topiramate (TOPAMAX) 25 MG tablet    Sig: Take 1 tablet (25 mg total) by mouth 2 (two)  times daily.    Dispense:  60 tablet    Refill:  1   Raliegh Ip,  MSN, FNP-C Patient [facility] [address] [place], [place] [address] [phone]

## 2018-03-28 NOTE — Patient Instructions (Signed)
Topiramate tablets What is this medicine? TOPIRAMATE (toe PYRE a mate) is used to treat seizures in adults or children with epilepsy. It is also used for the prevention of migraine headaches. This medicine may be used for other purposes; ask your health care provider or pharmacist if you have questions. COMMON BRAND NAME(S): Topamax, Topiragen What should I tell my health care provider before I take this medicine? They need to know if you have any of these conditions: -bleeding disorders -cirrhosis of the liver or liver disease -diarrhea -glaucoma -kidney stones or kidney disease -low blood counts, like low white cell, platelet, or red cell counts -lung disease like asthma, obstructive pulmonary disease, emphysema -metabolic acidosis -on a ketogenic diet -schedule for surgery or a procedure -suicidal thoughts, plans, or attempt; a previous suicide attempt by you or a family member -an unusual or allergic reaction to topiramate, other medicines, foods, dyes, or preservatives -pregnant or trying to get pregnant -breast-feeding How should I use this medicine? Take this medicine by mouth with a glass of water. Follow the directions on the prescription label. Do not crush or chew. You may take this medicine with meals. Take your medicine at regular intervals. Do not take it more often than directed. Talk to your pediatrician regarding the use of this medicine in children. Special care may be needed. While this drug may be prescribed for children as young as 2 years of age for selected conditions, precautions do apply. Overdosage: If you think you have taken too much of this medicine contact a poison control center or emergency room at once. NOTE: This medicine is only for you. Do not share this medicine with others. What if I miss a dose? If you miss a dose, take it as soon as you can. If your next dose is to be taken in less than 6 hours, then do not take the missed dose. Take the next dose at  your regular time. Do not take double or extra doses. What may interact with this medicine? Do not take this medicine with any of the following medications: -probenecid This medicine may also interact with the following medications: -acetazolamide -alcohol -amitriptyline -aspirin and aspirin-like medicines -birth control pills -certain medicines for depression -certain medicines for seizures -certain medicines that treat or prevent blood clots like warfarin, enoxaparin, dalteparin, apixaban, dabigatran, and rivaroxaban -digoxin -hydrochlorothiazide -lithium -medicines for pain, sleep, or muscle relaxation -metformin -methazolamide -NSAIDS, medicines for pain and inflammation, like ibuprofen or naproxen -pioglitazone -risperidone This list may not describe all possible interactions. Give your health care provider a list of all the medicines, herbs, non-prescription drugs, or dietary supplements you use. Also tell them if you smoke, drink alcohol, or use illegal drugs. Some items may interact with your medicine. What should I watch for while using this medicine? Visit your doctor or health care professional for regular checks on your progress. Do not stop taking this medicine suddenly. This increases the risk of seizures if you are using this medicine to control epilepsy. Wear a medical identification bracelet or chain to say you have epilepsy or seizures, and carry a card that lists all your medicines. This medicine can decrease sweating and increase your body temperature. Watch for signs of deceased sweating or fever, especially in children. Avoid extreme heat, hot baths, and saunas. Be careful about exercising, especially in hot weather. Contact your health care provider right away if you notice a fever or decrease in sweating. You should drink plenty of fluids while taking this medicine.   If you have had kidney stones in the past, this will help to reduce your chances of forming kidney  stones. If you have stomach pain, with nausea or vomiting and yellowing of your eyes or skin, call your doctor immediately. You may get drowsy, dizzy, or have blurred vision. Do not drive, use machinery, or do anything that needs mental alertness until you know how this medicine affects you. To reduce dizziness, do not sit or stand up quickly, especially if you are an older patient. Alcohol can increase drowsiness and dizziness. Avoid alcoholic drinks. If you notice blurred vision, eye pain, or other eye problems, seek medical attention at once for an eye exam. The use of this medicine may increase the chance of suicidal thoughts or actions. Pay special attention to how you are responding while on this medicine. Any worsening of mood, or thoughts of suicide or dying should be reported to your health care professional right away. This medicine may increase the chance of developing metabolic acidosis. If left untreated, this can cause kidney stones, bone disease, or slowed growth in children. Symptoms include breathing fast, fatigue, loss of appetite, irregular heartbeat, or loss of consciousness. Call your doctor immediately if you experience any of these side effects. Also, tell your doctor about any surgery you plan on having while taking this medicine since this may increase your risk for metabolic acidosis. Birth control pills may not work properly while you are taking this medicine. Talk to your doctor about using an extra method of birth control. Women who become pregnant while using this medicine may enroll in the [place] American Antiepileptic Drug Pregnancy Registry by calling 1-888-233-2334. This registry collects information about the safety of antiepileptic drug use during pregnancy. What side effects may I notice from receiving this medicine? Side effects that you should report to your doctor or health care professional as soon as possible: -allergic reactions like skin rash, itching or hives,  swelling of the face, lips, or tongue -decreased sweating and/or rise in body temperature -depression -difficulty breathing, fast or irregular breathing patterns -difficulty speaking -difficulty walking or controlling muscle movements -hearing impairment -redness, blistering, peeling or loosening of the skin, including inside the mouth -tingling, pain or numbness in the hands or feet -unusual bleeding or bruising -unusually weak or tired -worsening of mood, thoughts or actions of suicide or dying Side effects that usually do not require medical attention (report to your doctor or health care professional if they continue or are bothersome): -altered taste -back pain, joint or muscle aches and pains -diarrhea, or constipation -headache -loss of appetite -nausea -stomach upset, indigestion -tremors This list may not describe all possible side effects. Call your doctor for medical advice about side effects. You may report side effects to FDA at 1-800-FDA-1088. Where should I keep my medicine? Keep out of the reach of children. Store at room temperature between 15 and 30 degrees C (59 and 86 degrees F) in a tightly closed container. Protect from moisture. Throw away any unused medicine after the expiration date. NOTE: This sheet is a summary. It may not cover all possible information. If you have questions about this medicine, talk to your doctor, pharmacist, or health care provider.  2018 Elsevier/Gold Standard (2013-06-03 23:17:57)  

## 2018-03-29 ENCOUNTER — Telehealth (INDEPENDENT_AMBULATORY_CARE_PROVIDER_SITE_OTHER): Payer: Self-pay | Admitting: Physician Assistant

## 2018-03-29 ENCOUNTER — Ambulatory Visit (INDEPENDENT_AMBULATORY_CARE_PROVIDER_SITE_OTHER): Payer: Medicaid Other | Admitting: Orthopaedic Surgery

## 2018-03-29 ENCOUNTER — Ambulatory Visit (INDEPENDENT_AMBULATORY_CARE_PROVIDER_SITE_OTHER): Payer: Medicaid Other

## 2018-03-29 ENCOUNTER — Encounter (INDEPENDENT_AMBULATORY_CARE_PROVIDER_SITE_OTHER): Payer: Self-pay | Admitting: Orthopaedic Surgery

## 2018-03-29 DIAGNOSIS — M5441 Lumbago with sciatica, right side: Secondary | ICD-10-CM | POA: Diagnosis not present

## 2018-03-29 DIAGNOSIS — M5442 Lumbago with sciatica, left side: Secondary | ICD-10-CM | POA: Diagnosis not present

## 2018-03-29 LAB — CBC WITH DIFFERENTIAL/PLATELET
Basophils Absolute: 0 10*3/uL (ref 0.0–0.2)
Basos: 0 %
EOS (ABSOLUTE): 0.1 10*3/uL (ref 0.0–0.4)
Eos: 2 %
Hematocrit: 31.2 % — ABNORMAL LOW (ref 34.0–46.6)
Hemoglobin: 9.8 g/dL — ABNORMAL LOW (ref 11.1–15.9)
Immature Grans (Abs): 0 10*3/uL (ref 0.0–0.1)
Immature Granulocytes: 0 %
Lymphocytes Absolute: 3.3 10*3/uL — ABNORMAL HIGH (ref 0.7–3.1)
Lymphs: 43 %
MCH: 24.4 pg — ABNORMAL LOW (ref 26.6–33.0)
MCHC: 31.4 g/dL — ABNORMAL LOW (ref 31.5–35.7)
MCV: 78 fL — ABNORMAL LOW (ref 79–97)
Monocytes Absolute: 0.7 10*3/uL (ref 0.1–0.9)
Monocytes: 10 %
Neutrophils Absolute: 3.5 10*3/uL (ref 1.4–7.0)
Neutrophils: 45 %
Platelets: 405 10*3/uL (ref 150–450)
RBC: 4.01 x10E6/uL (ref 3.77–5.28)
RDW: 15.1 % (ref 12.3–15.4)
WBC: 7.6 10*3/uL (ref 3.4–10.8)

## 2018-03-29 LAB — IRON AND TIBC
Iron Saturation: 5 % — CL (ref 15–55)
Iron: 22 ug/dL — ABNORMAL LOW (ref 27–159)
Total Iron Binding Capacity: 437 ug/dL (ref 250–450)
UIBC: 415 ug/dL (ref 131–425)

## 2018-03-29 MED ORDER — PREDNISONE 10 MG (21) PO TBPK: ORAL_TABLET | ORAL | 0 refills | Status: DC

## 2018-03-29 MED ORDER — METHOCARBAMOL 500 MG PO TABS: 500.0000 mg | ORAL_TABLET | Freq: Two times a day (BID) | ORAL | 0 refills | Status: DC | PRN

## 2018-03-29 NOTE — Telephone Encounter (Signed)
FAXED

## 2018-03-29 NOTE — Progress Notes (Signed)
Office Visit Note   Patient: Andrea Stone           Date of Birth: 07-Aug-1980           MRN: [id number] Visit Date: 03/29/2018              Requested by: Kallie Locks, FNP [address] [place], [place] [address] PCP: Kallie Locks, FNP   Assessment & Plan: Visit Diagnoses:  1. Low back pain with bilateral sciatica, unspecified back pain laterality, unspecified chronicity     Plan: Impression is lumbar radiculopathy.  At this point, we will call in a Sterapred taper as well as Robaxin.  We will also send the patient to formal physical therapy.  She will follow-up with [place] in 8 weeks time for recheck.  Call with concerns or questions in the meantime.  Follow-Up Instructions: Return in about 8 weeks (around 05/24/2018).   Orders:  Orders Placed This Encounter  Procedures  . XR Lumbar Spine 2-3 Views   Meds ordered this encounter  Medications  . predniSONE (STERAPRED UNI-PAK 21 TAB) 10 MG (21) TBPK tablet    Sig: Take as directed    Dispense:  21 tablet    Refill:  0  . methocarbamol (ROBAXIN) 500 MG tablet    Sig: Take 1 tablet (500 mg total) by mouth 2 (two) times daily as needed for muscle spasms.    Dispense:  20 tablet    Refill:  0      Procedures: No procedures performed   Clinical Data: No additional findings.   Subjective: Chief Complaint  Patient presents with  . Lower Back - Pain    HPI patient is a pleasant 37 year old female who presents to our clinic today with burning and tingling to the anterior aspect of both lower extremities.  This is been ongoing for the past year without any known injury or change in activity.  She does occasionally admit to midline lower back pain as well.  The paresthesias are constant.  They do worsen with walking.  Was initially seen at [facility] orthopedics where she was told she had arthritis.  She has tried anti-inflammatories without relief of symptoms.  She does note occasional weakness to the right lower  extremity.  No bowel or bladder change and no saddle paresthesias.  No previous ESI or MRI.  She has not been to formal physical therapy.  Review of Systems as detailed in HPI.  All others reviewed and are negative.   Objective: Vital Signs: LMP 03/14/2018   Physical Exam well-developed well-nourished female in no acute distress.  Alert and oriented x3.  Ortho Exam examination of her lumbar spine reveals mild tenderness lower lumbar spine.  No paraspinous tenderness.  No pain with lumbar flexion.  She has mild pain with lumbar extension.  No pain with rotation.  No focal weakness.  Full strength with resisted straight leg raise.  Minimal pain with straight leg raise.  Specialty Comments:  No specialty comments available.  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 Low back pain with bilateral sciatica 03/29/2018  . DOE (dyspnea on exertion) 07/28/2017  . Chest pain with low risk for cardiac etiology 07/28/2017  . Family history of premature coronary heart disease 07/28/2017   Past Medical History:  Diagnosis Date  . Hyperlipidemia   . Hypertension   .  Trichomoniasis   . UTI (urinary tract infection) during pregnancy     Family History  Problem Relation Age of Onset  . Kidney disease Maternal Grandmother   . Heart failure Maternal Grandmother   . Hypertension Mother   . Asthma Sister   . Coronary artery disease Father        CABG - in 29s.  . Anesthesia problems Neg Hx   . Other Neg Hx     Past Surgical History:  Procedure Laterality Date  . LAPAROSCOPIC TUBAL LIGATION Bilateral 02/06/2013   Procedure: LAPAROSCOPIC TUBAL LIGATION;  Surgeon: Kathreen Cosier, MD;  Location: [facility];  Service: Gynecology;  Laterality: Bilateral;  . WISDOM TOOTH EXTRACTION     Social History   Occupational History  . Not on file  Tobacco Use  . Smoking  status: Former Smoker    Packs/day: 0.25    Years: 13.00    Pack years: 3.25    Types: Cigarettes    Last attempt to quit: 05/27/2017    Years since quitting: 0.8  . Smokeless tobacco: Never Used  Substance and Sexual Activity  . Alcohol use: Yes    Comment: socially  . Drug use: No  . Sexual activity: Yes    Birth control/protection: None

## 2018-03-29 NOTE — Telephone Encounter (Signed)
Patient called advised spoke with [facility] out patient rehab and was told the General PT form should be faxed over to them.  The fax# is [phone]. The number to contact patient is [phone]

## 2018-03-30 LAB — URINE CULTURE

## 2018-04-05 ENCOUNTER — Ambulatory Visit: Payer: Medicaid Other | Admitting: Physical Therapy

## 2018-04-06 ENCOUNTER — Other Ambulatory Visit: Payer: Self-pay | Admitting: Family Medicine

## 2018-04-06 DIAGNOSIS — D509 Iron deficiency anemia, unspecified: Secondary | ICD-10-CM

## 2018-04-06 MED ORDER — FERROUS SULFATE 325 (65 FE) MG PO TABS: 325.0000 mg | ORAL_TABLET | Freq: Every day | ORAL | 3 refills | Status: DC

## 2018-04-06 NOTE — Progress Notes (Signed)
TIBC results decreased. She reports increased fatigue, headaches, and increased cravings for ice. Rx for oral iron sent to pharmacy today. Patient is aware. She will keep follow up appointment 05/09/2018 and report to office if she experiences any worsening symptoms.

## 2018-04-10 ENCOUNTER — Ambulatory Visit: Payer: Medicaid Other | Attending: Orthopaedic Surgery

## 2018-04-10 ENCOUNTER — Other Ambulatory Visit: Payer: Self-pay

## 2018-04-10 DIAGNOSIS — M5416 Radiculopathy, lumbar region: Secondary | ICD-10-CM | POA: Diagnosis present

## 2018-04-10 DIAGNOSIS — M6281 Muscle weakness (generalized): Secondary | ICD-10-CM | POA: Insufficient documentation

## 2018-04-10 DIAGNOSIS — R293 Abnormal posture: Secondary | ICD-10-CM | POA: Diagnosis present

## 2018-04-10 NOTE — Therapy (Signed)
[facility]patient Rehabilitation [facility] [address] [place], [place], [address] Phone: [phone]   Fax:  [phone]  Physical Therapy Evaluation  Patient Details  Name: Andrea Stone MRN: [id number] Date of Birth: [birth year] Referring Provider (PT): Gershon Mussel   MD   Encounter Date: 04/10/2018  PT End of Session - 04/10/18 0907    Visit Number  1    Number of Visits  12    Date for PT Re-Evaluation  05/18/18    Authorization Type  MCD    PT Start Time  0925    PT Stop Time  1005    PT Time Calculation (min)  40 min    Activity Tolerance  Treatment limited secondary to medical complications (Comment)    Behavior During Therapy  [facility] for tasks assessed/performed       Past Medical History:  Diagnosis Date  . Hyperlipidemia   . Hypertension   . Trichomoniasis   . UTI (urinary tract infection) during pregnancy     Past Surgical History:  Procedure Laterality Date  . LAPAROSCOPIC TUBAL LIGATION Bilateral 02/06/2013   Procedure: LAPAROSCOPIC TUBAL LIGATION;  Surgeon: Kathreen Cosier, MD;  Location: [facility];  Service: Gynecology;  Laterality: Bilateral;  . WISDOM TOOTH EXTRACTION      There were no vitals filed for this visit.   Subjective Assessment - 04/10/18 0924    Subjective  She reports no injury 2 years ago.   She reports worse now going into legs a year ago.         Limitations  Walking   Sleep 30-60 min.     How long can you walk comfortably?  10-15 min thsn rests.     Diagnostic tests  xray:    Currently in Pain?  Yes    Pain Score  3     Pain Location  Back    Pain Orientation  Lower    Pain Descriptors / Indicators  Throbbing    Pain Type  Chronic pain    Pain Radiating Towards  posterior calfs bilateral    Pain Onset  More than a month ago    Pain Frequency  Constant    Aggravating Factors   walking ,  over work in home     Pain Relieving Factors  rest, medication         [facility] PT Assessment - 04/10/18 0001      Assessment   Medical Diagnosis  lumbar radiculopathy    Referring Provider (PT)  Gershon Mussel   MD    Onset Date/Surgical Date  --   2 years   Next MD Visit  8 weeks    Prior Therapy  No      Precautions   Precautions  None      Restrictions   Weight Bearing Restrictions  No      Balance Screen   Has the patient fallen in the past 6 months  No    Has the patient had a decrease in activity level because of a fear of falling?   No      Prior Function   Level of Independence  Independent    Vocation  Unemployed      Cognition   Overall Cognitive Status  Within Functional Limits for tasks assessed      Posture/Postural Control   Posture Comments  increased lumbar lordosis approx 10 degrees      ROM / Strength   AROM / PROM / Strength  AROM;Strength      AROM   AROM Assessment Site  Lumbar    Lumbar Flexion  70    Lumbar Extension  30    Lumbar - Right Side Bend  20    Lumbar - Left Side Bend  30      Strength   Overall Strength Comments  LE WNL , poor abdominal control but able to do a good PPT with cues      Flexibility   Soft Tissue Assessment /Muscle Length  yes    Hamstrings  65 degrees bilaterally without incr leg pain.       Palpation   Palpation comment  no significant tenderness in lower back or sacrum.         Special Tests   Other special tests   prone on elbows  mild back pain no leg symptoms.  Prayer stretch no pain but LT lumbar more posterior than RT.                   Objective measurements completed on examination: See above findings.                PT Short Term Goals - 04/10/18 0959      PT SHORT TERM GOAL #1   Title  She will be independent with initial HEp     Baseline  No program    Time  2    Period  Weeks    Status  New      PT SHORT TERM GOAL #2   Title  She will report 10% decr in leg symptoms and become more intermittant    Baseline  constand pain with variable pain levels    Time  2    Period  Weeks     Status  New        PT Long Term Goals - 04/10/18 1000      PT LONG TERM GOAL #1   Title  She will be indpeendnent with all HEP issued    Baseline  independent with initial HEP    Time  6    Period  Weeks    Status  New      PT LONG TERM GOAL #2   Title  She will report no leg painfor 75% of day    Baseline  leg pain constant    Time  6    Period  Weeks    Status  New      PT LONG TERM GOAL #3   Title  She will be able to do activity oin feet for 30 min or more wiithout increased pain    Baseline  10-15 min max    Time  6    Period  Weeks    Status  New      PT LONG TERM GOAL #4   Title  She will report able to sleep for 4 hours without waking due to back pain.    Baseline  , 1 hour    Time  6    Period  Weeks    Status  New             Plan - 04/10/18 0916    Clinical Impression Statement  Ms Castelo presentw tih report of chronic back and leg pain constant in nature worse when on feet and better sitting and lying though sleep isquickly disturbed.   Le strength normal and abdominals fair with cuing.   She has  increased lumbar lordosis nad possible rotation with flexion postures.    She should have decr pain with PT.     Clinical Presentation  Stable    Clinical Decision Making  Low    Rehab Potential  Good    Clinical Impairments Affecting Rehab Potential  chronicity of problem , abdominal weakness.     PT Frequency  2x / week    PT Duration  6 weeks    PT Treatment/Interventions  Passive range of motion;Manual techniques;Patient/family education;Therapeutic activities;Therapeutic exercise;Cryotherapy;Moist Heat;Electrical Stimulation    PT Next Visit Plan  REveiew HEP . Progress HEP . STretching lumbar spine, modalities as needed    PT Home Exercise Plan  child's pose , PPT    Consulted and Agree with Plan of Care  Patient       Patient will benefit from skilled therapeutic intervention in order to improve the following deficits and impairments:  Pain,  Increased muscle spasms, Decreased activity tolerance  Visit Diagnosis: Radiculopathy, lumbar region  Abnormal posture  Muscle weakness (generalized)     Problem List Patient Active Problem List   Diagnosis Date Noted  . Low back pain with bilateral sciatica 03/29/2018  . DOE (dyspnea on exertion) 07/28/2017  . Chest pain with low risk for cardiac etiology 07/28/2017  . Family history of premature coronary heart disease 07/28/2017    Caprice Red  PT 04/10/2018, 10:06 AM  [facility] [address] [place], [place], [address] Phone: [phone]   Fax:  [phone]  Name: Exa Bomba MRN: [id number] Date of Birth: 01/30/1981

## 2018-04-10 NOTE — Patient Instructions (Signed)
PELVIC TILT: Posterior    Tighten abdominals, flatten low back. 10___ reps per set, __2-3_ sets per day, _7__ days per week  HOLD 5 SEC        CAN TIGHTEN BUTTOCKS  TO GET BACK FLAT Copyright  VHI. All rights reserved.  BACK: Child's Pose (Sciatica)    Sit in knee-chest position and reach arms forward. Separate knees for comfort. Hold position for _15-20  sec__  Repeat _3__ times. Do _3__ times per day. cand put a pillow under butt if too much pressure to ankles or towel roll under ankle Copyright  VHI. All rights reserved.

## 2018-04-12 ENCOUNTER — Ambulatory Visit: Payer: Medicaid Other | Admitting: Family Medicine

## 2018-04-18 ENCOUNTER — Telehealth: Payer: Self-pay

## 2018-04-18 ENCOUNTER — Ambulatory Visit: Payer: Medicaid Other | Attending: Orthopaedic Surgery

## 2018-04-18 DIAGNOSIS — M5416 Radiculopathy, lumbar region: Secondary | ICD-10-CM | POA: Insufficient documentation

## 2018-04-18 DIAGNOSIS — R293 Abnormal posture: Secondary | ICD-10-CM | POA: Insufficient documentation

## 2018-04-18 DIAGNOSIS — M6281 Muscle weakness (generalized): Secondary | ICD-10-CM | POA: Insufficient documentation

## 2018-04-18 NOTE — Telephone Encounter (Signed)
Spoke with patient regarding missed appointment this morning.  She reported thought first appointment was on the 12th.  Reviewed appointment schedule and encouraged to keep track.  Patient aware and appreciative.  Sheran Lawless, PT

## 2018-04-19 ENCOUNTER — Encounter: Payer: Self-pay | Admitting: Cardiology

## 2018-04-19 ENCOUNTER — Ambulatory Visit (INDEPENDENT_AMBULATORY_CARE_PROVIDER_SITE_OTHER): Payer: Medicaid Other | Admitting: Cardiology

## 2018-04-19 VITALS — BP 106/68 | HR 69 | Ht 64.0 in | Wt 157.0 lb

## 2018-04-19 DIAGNOSIS — Z8249 Family history of ischemic heart disease and other diseases of the circulatory system: Secondary | ICD-10-CM | POA: Diagnosis not present

## 2018-04-19 DIAGNOSIS — R079 Chest pain, unspecified: Secondary | ICD-10-CM | POA: Diagnosis not present

## 2018-04-19 DIAGNOSIS — R0609 Other forms of dyspnea: Secondary | ICD-10-CM

## 2018-04-19 DIAGNOSIS — E785 Hyperlipidemia, unspecified: Secondary | ICD-10-CM

## 2018-04-19 MED ORDER — PRAVASTATIN SODIUM 80 MG PO TABS: 80.0000 mg | ORAL_TABLET | Freq: Every day | ORAL | 3 refills | Status: DC

## 2018-04-19 NOTE — Progress Notes (Signed)
PCP: Kallie Locks, FNP  Clinic Note: Chief Complaint  Patient presents with  . Follow-up    NO CONCERNS     HPI: Andrea Stone is a 37 y.o. female with a PMH- HTN & HLD who presents today for delayed 6 month f/u evaluation of CP & SOB -- initially seen at the request of Kallie Locks, FNP    - former PCP had d/c'ed BP med - still on statin -but was not refilled.  Arlys Scatena was last in March 2019 -- following Echo, CPX   Recent Hospitalizations: n/a  Studies Personally Reviewed - (if available, images/films reviewed: From Epic Chart or Care Everywhere)  No new studies  Interval History: Andrea Stone presents today -  Talking on telephone while I walk in the room - did not stop conversation.  She actually is doing well.  No major complaints.  Her back is been bothering her quite a bit lately and she is now getting physical therapy for that.  Otherwise she is active and not having any cardiac symptoms.  No further episodes of chest pain or pressure.  Just some intermittent twinges.  No real exertional dyspnea, more related to deconditioning. No significant palpitations or irregular heartbeats.  No syncope/near syncope or TIA/amaurosis fugax.  No PND orthopnea or edema. No longer noting the extent of exertional dyspnea that she had before.  ROS: A comprehensive was performed. Review of Systems  Constitutional: Negative for diaphoresis and malaise/fatigue.  HENT: Negative for congestion and nosebleeds.   Respiratory: Positive for cough. Negative for sputum production and shortness of breath.   Gastrointestinal: Positive for heartburn (No benefit from Zantac). Negative for blood in stool and melena.  Genitourinary: Negative for hematuria.  Neurological: Negative for dizziness.  Psychiatric/Behavioral: The patient is nervous/anxious.   All other systems reviewed and are negative.  I have reviewed and (if needed) personally updated the patient's problem list, medications,  allergies, past medical and surgical history, social and family history.   Past Medical History:  Diagnosis Date  . Hyperlipidemia   . Hypertension   . Trichomoniasis   . UTI (urinary tract infection) during pregnancy     Past Surgical History:  Procedure Laterality Date  . CARDIOPULMONARY EXERCISE TEST  07/2017    No regional WMA. No valvular lesions. cardiopulmonary limitation seen. Peak VO2 normal for workload achieved on submaximal test. Dyspnea most likely related to deconditioning & limitations due to weight. Normal Study from a Heart & Lung standpoint.  Marland Kitchen LAPAROSCOPIC TUBAL LIGATION Bilateral 02/06/2013   Procedure: LAPAROSCOPIC TUBAL LIGATION;  Surgeon: Kathreen Cosier, MD;  Location: [facility];  Service: Gynecology;  Laterality: Bilateral;  . TRANSTHORACIC ECHOCARDIOGRAM  07/2017   EF 60-65%.  No regional WMA. No valvular lesions.  . WISDOM TOOTH EXTRACTION      Current Meds  Medication Sig  . aspirin 81 MG EC tablet TAKE 1 TABLET BY MOUTH EVERY DAY  . esomeprazole (NEXIUM) 20 MG capsule TAKE 1 CAPSULE DAILY AT LEAST 1 HOUR BEFORE A MEAL SWALLOWING WHOLE. DO NOT CRUSH/CHEW GRANULES.  . ferrous sulfate 325 (65 FE) MG tablet Take 1 tablet (325 mg total) by mouth daily.  . methocarbamol (ROBAXIN) 500 MG tablet Take 1 tablet (500 mg total) by mouth 2 (two) times daily as needed for muscle spasms.  . pravastatin (PRAVACHOL) 80 MG tablet Take 1 tablet (80 mg total) by mouth daily.  . predniSONE (STERAPRED UNI-PAK 21 TAB) 10 MG (21) TBPK tablet Take as directed  .  topiramate (TOPAMAX) 25 MG tablet Take 1 tablet (25 mg total) by mouth 2 (two) times daily.  . [DISCONTINUED] pravastatin (PRAVACHOL) 80 MG tablet Take 1 tablet (80 mg total) by mouth daily.    Allergies  Allergen Reactions  . Zantac [Ranitidine Hcl] Swelling and Rash    Social History   Tobacco Use  . Smoking status: Current Some Day Smoker    Packs/day: 0.10    Years: 13.00    Pack years: 1.30    Types:  Cigarettes    Last attempt to quit: 05/27/2017    Years since quitting: 0.9  . Smokeless tobacco: Never Used  Substance Use Topics  . Alcohol use: Yes    Comment: socially  . Drug use: No   Social History   Social History Narrative   Disabled - (since childhood).    family history includes Asthma in her sister; Coronary artery disease in her father; Heart failure in her maternal grandmother; Hypertension in her mother; Kidney disease in her maternal grandmother.  Wt Readings from Last 3 Encounters:  04/19/18 157 lb (71.2 kg)  03/28/18 157 lb 9.6 oz (71.5 kg)  01/10/18 158 lb (71.7 kg)    PHYSICAL EXAM BP 106/68   Pulse 69   Ht 5\' 4"  (1.626 m)   Wt 157 lb (71.2 kg)   BMI 26.95 kg/m  Physical Exam  Constitutional: She is oriented to person, place, and time. She appears well-developed and well-nourished. No distress.  Well-groomed  HENT:  Head: Normocephalic and atraumatic.  Neck: Neck supple.  Cardiovascular: Normal rate, regular rhythm, normal heart sounds and intact distal pulses. Exam reveals no gallop and no friction rub.  No murmur heard. Pulmonary/Chest: Effort normal and breath sounds normal. No respiratory distress. She has no wheezes. She has no rales.  Musculoskeletal: Normal range of motion. She exhibits no edema (Trivial).  Neurological: She is alert and oriented to person, place, and time.  Psychiatric: She has a normal mood and affect. Her behavior is normal. Judgment and thought content normal.  Vitals reviewed.   Adult ECG Report Not checked  Other studies Reviewed: Additional studies/ records that were reviewed today include:  Recent Labs:  Lab Results  Component Value Date   CHOL 207 (H) 01/10/2018   HDL 38 (L) 01/10/2018   LDLCALC 152 (H) 01/10/2018   TRIG 85 01/10/2018   CHOLHDL 5.4 (H) 01/10/2018   Lab Results  Component Value Date   CREATININE 0.88 01/10/2018   BUN 11 01/10/2018   NA 137 01/10/2018   K 4.2 01/10/2018   CL 102  01/10/2018   CO2 22 01/10/2018     ASSESSMENT / PLAN: Overall stable from a cardiac standpoint.  No recurrent chest discomfort and control dyspnea.  No further evaluation required.  Can follow-up as needed.  Problem List Items Addressed This Visit    Chest pain with low risk for cardiac etiology    Probably musculoskeletal related chest discomfort.  No real abnormal findings on CPX.      DOE (dyspnea on exertion) (Chronic)    Notably improved.  Mostly deconditioning.      Family history of premature coronary heart disease - Primary (Chronic)    Normal CPX.  No recurrent symptoms.  We talked about maybe doing a coronary calcium score or coronary CTA, but with no recurrent symptoms, will hold off now.      Hyperlipidemia with target LDL less than 100 (Chronic)    With an LDL of 152, not likely  to be improved with medical management.  Target would be less than 100.  She needs to be back on statin.  I will just simply refill her pravastatin.  Defer further follow-up to PCP.      Relevant Medications   pravastatin (PRAVACHOL) 80 MG tablet      Current medicines are reviewed at length with the patient today. (+/- concerns) n/a The following changes have been made: n/a  Patient Instructions  Medication Instructions:  Your physician recommends that you continue on your current medications as directed. Please refer to the Current Medication list given to you today. If you need a refill on your cardiac medications before your next appointment, please call your pharmacy.   Lab work: None If you have labs (blood work) drawn today and your tests are completely normal, you will receive your results only by: Marland Kitchen MyChart Message (if you have MyChart) OR . A paper copy in the mail If you have any lab test that is abnormal or we need to change your treatment, we will call you to review the results.  Testing/Procedures: None  Follow-Up: At [facility], you and your health needs are  our priority.  As part of our continuing mission to provide you with exceptional heart care, we have created designated Provider Care Teams.  These Care Teams include your primary Cardiologist (physician) and Advanced Practice Providers (APPs -  Physician Assistants and Nurse Practitioners) who all work together to provide you with the care you need, when you need it. Marland Kitchen YOUR PROVIDER RECOMMENDS YOU FOLLOW UP ON AS NEEDED BASIS  Any Other Special Instructions Will Be Listed Below (If Applicable).     Studies Ordered:   No orders of the defined types were placed in this encounter.     Bryan Lemma, M.D., M.S. Interventional Cardiologist   Pager # [phone] Phone # [phone] [address] [place], [place] [address]   Thank you for choosing Heartcare at [facility]!!

## 2018-04-19 NOTE — Patient Instructions (Signed)
Medication Instructions:  Your physician recommends that you continue on your current medications as directed. Please refer to the Current Medication list given to you today. If you need a refill on your cardiac medications before your next appointment, please call your pharmacy.   Lab work: None If you have labs (blood work) drawn today and your tests are completely normal, you will receive your results only by: Marland Kitchen MyChart Message (if you have MyChart) OR . A paper copy in the mail If you have any lab test that is abnormal or we need to change your treatment, we will call you to review the results.  Testing/Procedures: None  Follow-Up: At [facility], you and your health needs are our priority.  As part of our continuing mission to provide you with exceptional heart care, we have created designated Provider Care Teams.  These Care Teams include your primary Cardiologist (physician) and Advanced Practice Providers (APPs -  Physician Assistants and Nurse Practitioners) who all work together to provide you with the care you need, when you need it. Marland Kitchen YOUR PROVIDER RECOMMENDS YOU FOLLOW UP ON AS NEEDED BASIS  Any Other Special Instructions Will Be Listed Below (If Applicable).

## 2018-04-21 ENCOUNTER — Encounter: Payer: Self-pay | Admitting: Cardiology

## 2018-04-21 NOTE — Assessment & Plan Note (Signed)
Normal CPX.  No recurrent symptoms.  We talked about maybe doing a coronary calcium score or coronary CTA, but with no recurrent symptoms, will hold off now.

## 2018-04-21 NOTE — Assessment & Plan Note (Signed)
With an LDL of 152, not likely to be improved with medical management.  Target would be less than 100.  She needs to be back on statin.  I will just simply refill her pravastatin.  Defer further follow-up to PCP.

## 2018-04-21 NOTE — Assessment & Plan Note (Signed)
Probably musculoskeletal related chest discomfort.  No real abnormal findings on CPX.

## 2018-04-21 NOTE — Assessment & Plan Note (Signed)
Notably improved.  Mostly deconditioning.

## 2018-04-24 ENCOUNTER — Ambulatory Visit: Payer: Medicaid Other | Admitting: Physical Therapy

## 2018-04-24 ENCOUNTER — Encounter: Payer: Self-pay | Admitting: Physical Therapy

## 2018-04-24 DIAGNOSIS — M5416 Radiculopathy, lumbar region: Secondary | ICD-10-CM

## 2018-04-24 DIAGNOSIS — M6281 Muscle weakness (generalized): Secondary | ICD-10-CM | POA: Diagnosis present

## 2018-04-24 DIAGNOSIS — R293 Abnormal posture: Secondary | ICD-10-CM

## 2018-04-24 NOTE — Therapy (Signed)
[facility] [address] [place], [place], [address] Phone: [phone]   Fax:  [phone]  Physical Therapy Treatment  Patient Details  Name: Andrea GelinasRhonda Stone MRN: [id number] Date of Birth: 12/11/1980 Referring Provider (PT): Gershon MusselNaiping Xu   MD   Encounter Date: 04/24/2018  PT End of Session - 04/24/18 0936    Visit Number  2    Number of Visits  12    Date for PT Re-Evaluation  05/18/18    Authorization Type  MCD    PT Start Time  0930    PT Stop Time  1015    PT Time Calculation (min)  45 min    Activity Tolerance  Treatment limited secondary to medical complications (Comment)    Behavior During Therapy  [facility] for tasks assessed/performed       Past Medical History:  Diagnosis Date  . Hyperlipidemia   . Hypertension   . Trichomoniasis   . UTI (urinary tract infection) during pregnancy     Past Surgical History:  Procedure Laterality Date  . CARDIOPULMONARY EXERCISE TEST  07/2017    No regional WMA. No valvular lesions. cardiopulmonary limitation seen. Peak VO2 normal for workload achieved on submaximal test. Dyspnea most likely related to deconditioning & limitations due to weight. Normal Study from a Heart & Lung standpoint.  Marland Kitchen. LAPAROSCOPIC TUBAL LIGATION Bilateral 02/06/2013   Procedure: LAPAROSCOPIC TUBAL LIGATION;  Surgeon: Kathreen CosierBernard A Marshall, MD;  Location: [facility];  Service: Gynecology;  Laterality: Bilateral;  . TRANSTHORACIC ECHOCARDIOGRAM  07/2017   EF 60-65%.  No regional WMA. No valvular lesions.  . WISDOM TOOTH EXTRACTION      There were no vitals filed for this visit.  Subjective Assessment - 04/24/18 0932    Subjective  Pt arriving to therpay reproting 5/10 low back pain with radiation down both legs.     Limitations  Walking    How long can you walk comfortably?  10-15 min then rests.     Diagnostic tests  xray:    Currently in Pain?  Yes    Pain Score  5     Pain Location  Back    Pain Orientation   Lower    Pain Descriptors / Indicators  Aching;Burning    Pain Type  Chronic pain    Pain Radiating Towards  back of both legs    Pain Onset  More than a month ago    Pain Frequency  Constant    Aggravating Factors   walking, household chores    Pain Relieving Factors  resting, lying down                       [facility] Adult PT Treatment/Exercise - 04/24/18 0001      Exercises   Exercises  Lumbar      Lumbar Exercises: Stretches   Active Hamstring Stretch  Right;Left;2 reps;30 seconds    Single Knee to Chest Stretch  Right;Left;2 reps;20 seconds    Figure 4 Stretch  2 reps;20 seconds      Lumbar Exercises: Supine   Ab Set  10 reps    Clam  10 reps;3 seconds    Bridge  Compliant;5 seconds;Limitations    Bridge Limitations  decreased lift off table    Straight Leg Raise  10 reps;2 seconds    Other Supine Lumbar Exercises  ball squeezes x 15 holding 5 seconds each      Modalities   Modalities  Moist  Heat      Moist Heat Therapy   Number Minutes Moist Heat  10 Minutes    Moist Heat Location  Lumbar Spine             PT Education - 04/24/18 0936    Education Details  HEP    Person(s) Educated  Patient    Methods  Explanation;Demonstration    Comprehension  Verbalized understanding;Returned demonstration       PT Short Term Goals - 04/24/18 0941      PT SHORT TERM GOAL #1   Title  She will be independent with initial HEp     Status  On-going      PT SHORT TERM GOAL #2   Title  She will report 10% decr in leg symptoms and become more intermittant    Baseline  constand pain with variable pain levels    Time  2    Period  Weeks    Status  New        PT Long Term Goals - 04/24/18 0941      PT LONG TERM GOAL #1   Title  She will be indpeendnent with all HEP issued    Baseline  independent with initial HEP    Time  6    Period  Weeks    Status  New      PT LONG TERM GOAL #2   Title  She will report no leg painfor 75% of day    Baseline   leg pain constant    Time  6    Period  Weeks    Status  New      PT LONG TERM GOAL #3   Title  She will be able to do activity oin feet for 30 min or more wiithout increased pain    Baseline  10-15 min max    Time  6    Period  Weeks    Status  New      PT LONG TERM GOAL #4   Title  She will report able to sleep for 4 hours without waking due to back pain.    Baseline  , 1 hour    Time  6    Period  Weeks    Status  New            Plan - 04/24/18 0937    Clinical Impression Statement  Pt arriving today reporting 5/10 pain in her low back which radiates down bilateral LE's. Pt tolreating all exercises well today reporting no increase in pain. Continue with skilled PT to progress toward pt's goals set.     Rehab Potential  Good    Clinical Impairments Affecting Rehab Potential  chronicity of problem , abdominal weakness.     PT Frequency  2x / week    PT Duration  6 weeks    PT Treatment/Interventions  Passive range of motion;Manual techniques;Patient/family education;Therapeutic activities;Therapeutic exercise;Cryotherapy;Moist Heat;Electrical Stimulation    PT Next Visit Plan  REveiew HEP . Progress HEP . STretching lumbar spine, modalities as needed    PT Home Exercise Plan  child's pose , PPT, hamstring stretches    Consulted and Agree with Plan of Care  Patient       Patient will benefit from skilled therapeutic intervention in order to improve the following deficits and impairments:  Pain, Increased muscle spasms, Decreased activity tolerance  Visit Diagnosis: Radiculopathy, lumbar region  Abnormal posture  Muscle weakness (generalized)  Problem List Patient Active Problem List   Diagnosis Date Noted  . Hyperlipidemia with target LDL less than 100 04/19/2018  . Low back pain with bilateral sciatica 03/29/2018  . DOE (dyspnea on exertion) 07/28/2017  . Chest pain with low risk for cardiac etiology 07/28/2017  . Family history of premature coronary  heart disease 07/28/2017    Sharmon Leyden, PT 04/24/2018, 10:06 AM  [facility] [address] [place], [place], [address] Phone: [phone]   Fax:  [phone]  Name: Andrea Stone MRN: [id number] Date of Birth: 1981-01-18

## 2018-04-26 ENCOUNTER — Ambulatory Visit: Payer: Medicaid Other | Admitting: Physical Therapy

## 2018-05-03 ENCOUNTER — Encounter: Payer: Self-pay | Admitting: Physical Therapy

## 2018-05-03 ENCOUNTER — Ambulatory Visit: Payer: Medicaid Other | Admitting: Physical Therapy

## 2018-05-03 DIAGNOSIS — M5416 Radiculopathy, lumbar region: Secondary | ICD-10-CM

## 2018-05-03 DIAGNOSIS — M6281 Muscle weakness (generalized): Secondary | ICD-10-CM

## 2018-05-03 DIAGNOSIS — R293 Abnormal posture: Secondary | ICD-10-CM

## 2018-05-03 NOTE — Therapy (Signed)
[facility] [address] [place], [place], [address] Phone: [phone]   Fax:  [phone]  Physical Therapy Treatment  Patient Details  Name: Andrea Stone MRN: [id number] Date of Birth: Jan 22, 1981 Referring Provider (PT): Andrea Mussel   MD   Encounter Date: 05/03/2018  PT End of Session - 05/03/18 1316    Visit Number  --   no charge - no auth   Number of Visits  12    Date for PT Re-Evaluation  05/18/18    Authorization Type  MCD- awaiting auth    PT Start Time  0108   8 minutes late    PT Stop Time  0138    PT Time Calculation (min)  30 min       Past Medical History:  Diagnosis Date  . Hyperlipidemia   . Hypertension   . Trichomoniasis   . UTI (urinary tract infection) during pregnancy     Past Surgical History:  Procedure Laterality Date  . CARDIOPULMONARY EXERCISE TEST  07/2017    No regional WMA. No valvular lesions. cardiopulmonary limitation seen. Peak VO2 normal for workload achieved on submaximal test. Dyspnea most likely related to deconditioning & limitations due to weight. Normal Study from a Heart & Lung standpoint.  Marland Kitchen LAPAROSCOPIC TUBAL LIGATION Bilateral 02/06/2013   Procedure: LAPAROSCOPIC TUBAL LIGATION;  Surgeon: Andrea Cosier, MD;  Location: [facility];  Service: Gynecology;  Laterality: Bilateral;  . TRANSTHORACIC ECHOCARDIOGRAM  07/2017   EF 60-65%.  No regional WMA. No valvular lesions.  . WISDOM TOOTH EXTRACTION      There were no vitals filed for this visit.  Subjective Assessment - 05/03/18 1310    Subjective  Doing okay with the exercises. No pain upon arrival.     Currently in Pain?  No/denies                       [facility] Adult PT Treatment/Exercise - 05/03/18 0001      Exercises   Exercises  Lumbar      Lumbar Exercises: Stretches   Active Hamstring Stretch  Right;Left;2 reps;30 seconds    Single Knee to Chest Stretch  Right;Left;2 reps;20 seconds    Quadruped  Mid Back Stretch Limitations  childs pose x 60sec      Lumbar Exercises: Aerobic   Nustep  L4 UE/LE x 5 minutes       Lumbar Exercises: Supine   Pelvic Tilt  15 reps    Bridge  10 reps    Bridge Limitations  with abdominal draw in     Straight Leg Raise  10 reps;2 seconds    Straight Leg Raises Limitations  with abdominal drraw in      Lumbar Exercises: Quadruped   Straight Leg Raise  10 reps    Straight Leg Raises Limitations  with abdominal draw in               PT Short Term Goals - 04/24/18 0941      PT SHORT TERM GOAL #1   Title  She will be independent with initial HEp     Status  On-going      PT SHORT TERM GOAL #2   Title  She will report 10% decr in leg symptoms and become more intermittant    Baseline  constand pain with variable pain levels    Time  2    Period  Weeks    Status  New  PT Long Term Goals - 04/24/18 0941      PT LONG TERM GOAL #1   Title  She will be indpeendnent with all HEP issued    Baseline  independent with initial HEP    Time  6    Period  Weeks    Status  New      PT LONG TERM GOAL #2   Title  She will report no leg painfor 75% of day    Baseline  leg pain constant    Time  6    Period  Weeks    Status  New      PT LONG TERM GOAL #3   Title  She will be able to do activity oin feet for 30 min or more wiithout increased pain    Baseline  10-15 min max    Time  6    Period  Weeks    Status  New      PT LONG TERM GOAL #4   Title  She will report able to sleep for 4 hours without waking due to back pain.    Baseline  , 1 hour    Time  6    Period  Weeks    Status  New            Plan - 05/03/18 1311    Clinical Impression Statement  Pt arrives reporting no pain and intermittent leg pain rather than constant. Reviewed entire HEP and preogressed as tolerated. Pt wishes to return in 2 weeks after eye doctor appointments are finished.     PT Next Visit Plan  REveiew HEP . Progress HEP . STretching lumbar  spine, modalities as needed    PT Home Exercise Plan  child's pose , PPT, hamstring stretches, knee to chest, bridge,     Consulted and Agree with Plan of Care  Patient       Patient will benefit from skilled therapeutic intervention in order to improve the following deficits and impairments:  Pain, Increased muscle spasms, Decreased activity tolerance  Visit Diagnosis: Radiculopathy, lumbar region  Abnormal posture  Muscle weakness (generalized)     Problem List Patient Active Problem List   Diagnosis Date Noted  . Hyperlipidemia with target LDL less than 100 04/19/2018  . Low back pain with bilateral sciatica 03/29/2018  . DOE (dyspnea on exertion) 07/28/2017  . Chest pain with low risk for cardiac etiology 07/28/2017  . Family history of premature coronary heart disease 07/28/2017    Sherrie Mustacheonoho, Jessica McGee, PTA 05/03/2018, 1:44 PM  [facility] [address] [place], [place], [address] Phone: [phone]   Fax:  [phone]  Name: Andrea Stone MRN: [id number] Date of Birth: 01/24/1981

## 2018-05-04 ENCOUNTER — Encounter (HOSPITAL_COMMUNITY): Payer: Self-pay

## 2018-05-04 ENCOUNTER — Other Ambulatory Visit: Payer: Self-pay

## 2018-05-04 ENCOUNTER — Emergency Department (HOSPITAL_COMMUNITY)
Admission: EM | Admit: 2018-05-04 | Discharge: 2018-05-04 | Disposition: A | Discharge status: Home / Self Care | Payer: Medicaid Other | Attending: Emergency Medicine | Admitting: Emergency Medicine

## 2018-05-04 DIAGNOSIS — Y9389 Activity, other specified: Secondary | ICD-10-CM | POA: Insufficient documentation

## 2018-05-04 DIAGNOSIS — Z79899 Other long term (current) drug therapy: Secondary | ICD-10-CM | POA: Insufficient documentation

## 2018-05-04 DIAGNOSIS — F17210 Nicotine dependence, cigarettes, uncomplicated: Secondary | ICD-10-CM | POA: Diagnosis not present

## 2018-05-04 DIAGNOSIS — X58XXXA Exposure to other specified factors, initial encounter: Secondary | ICD-10-CM | POA: Insufficient documentation

## 2018-05-04 DIAGNOSIS — H5789 Other specified disorders of eye and adnexa: Secondary | ICD-10-CM | POA: Diagnosis present

## 2018-05-04 DIAGNOSIS — I10 Essential (primary) hypertension: Secondary | ICD-10-CM | POA: Diagnosis not present

## 2018-05-04 DIAGNOSIS — H1132 Conjunctival hemorrhage, left eye: Secondary | ICD-10-CM | POA: Diagnosis not present

## 2018-05-04 DIAGNOSIS — Y998 Other external cause status: Secondary | ICD-10-CM | POA: Insufficient documentation

## 2018-05-04 DIAGNOSIS — Y929 Unspecified place or not applicable: Secondary | ICD-10-CM | POA: Diagnosis not present

## 2018-05-04 MED ORDER — FLUORESCEIN SODIUM 1 MG OP STRP
1.0000 | ORAL_STRIP | Freq: Once | OPHTHALMIC | Status: AC
  Administered 2018-05-04: 1 via OPHTHALMIC
  Filled 2018-05-04: qty 1

## 2018-05-04 MED ORDER — TETRACAINE HCL 0.5 % OP SOLN
1.0000 [drp] | Freq: Once | OPHTHALMIC | Status: AC
  Administered 2018-05-04: 1 [drp] via OPHTHALMIC
  Filled 2018-05-04: qty 4

## 2018-05-04 NOTE — ED Triage Notes (Signed)
Pt c/o left eye pain since Sunday. Pt states she has been using eye drops with no relief. Pt denies injury to the eye. Pt endorses intermittent sharp pain to the eye. No drainage or swelling noted to the eye. Pt also endorses blurred vision from left eye.

## 2018-05-04 NOTE — ED Notes (Signed)
Tetracaine and Ophthalmic strip at bedside

## 2018-05-04 NOTE — ED Notes (Signed)
Patient verbalizes understanding of discharge instructions. Opportunity for questioning and answers were provided. Armband removed by staff, pt discharged from ED.  

## 2018-05-04 NOTE — ED Provider Notes (Signed)
[facility] EMERGENCY DEPARTMENT Provider Note   CSN: [id number] Arrival date & time: 05/04/18  1636     History   Chief Complaint Chief Complaint  Patient presents with  . Eye Pain    HPI Andrea Stone is a 37 y.o. female.  The history is provided by the patient.  Eye Pain  This is a new problem. Episode onset: 6 days ago. The problem occurs constantly. The problem has not changed since onset.Associated symptoms comments: Redness in the left eye.  occassional pain but no constant pain or blurry vision.  No trauma to the eye.  Wears glasses.  . Nothing aggravates the symptoms. Nothing relieves the symptoms. She has tried nothing for the symptoms.    Past Medical History:  Diagnosis Date  . Hyperlipidemia   . Hypertension   . Trichomoniasis   . UTI (urinary tract infection) during pregnancy     Patient Active Problem List   Diagnosis Date Noted  . Hyperlipidemia with target LDL less than 100 04/19/2018  . Low back pain with bilateral sciatica 03/29/2018  . DOE (dyspnea on exertion) 07/28/2017  . Chest pain with low risk for cardiac etiology 07/28/2017  . Family history of premature coronary heart disease 07/28/2017    Past Surgical History:  Procedure Laterality Date  . CARDIOPULMONARY EXERCISE TEST  07/2017    No regional WMA. No valvular lesions. cardiopulmonary limitation seen. Peak VO2 normal for workload achieved on submaximal test. Dyspnea most likely related to deconditioning & limitations due to weight. Normal Study from a Heart & Lung standpoint.  Marland Kitchen LAPAROSCOPIC TUBAL LIGATION Bilateral 02/06/2013   Procedure: LAPAROSCOPIC TUBAL LIGATION;  Surgeon: Kathreen Cosier, MD;  Location: [facility];  Service: Gynecology;  Laterality: Bilateral;  . TRANSTHORACIC ECHOCARDIOGRAM  07/2017   EF 60-65%.  No regional WMA. No valvular lesions.  . WISDOM TOOTH EXTRACTION       OB History    Gravida  3   Para  3   Term  3   Preterm  0   AB  0   Living  3     SAB  0   TAB  0   Ectopic  0   Multiple  0   Live Births  3            Home Medications    Prior to Admission medications   Medication Sig Start Date End Date Taking? Authorizing Provider  aspirin 81 MG EC tablet TAKE 1 TABLET BY MOUTH EVERY DAY 10/04/17   Quentin Angst, MD  esomeprazole (NEXIUM) 20 MG capsule TAKE 1 CAPSULE DAILY AT LEAST 1 HOUR BEFORE A MEAL SWALLOWING WHOLE. DO NOT CRUSH/CHEW GRANULES. 01/10/18   Kallie Locks, FNP  ferrous sulfate 325 (65 FE) MG tablet Take 1 tablet (325 mg total) by mouth daily. 04/06/18 08/04/18  Kallie Locks, FNP  methocarbamol (ROBAXIN) 500 MG tablet Take 1 tablet (500 mg total) by mouth 2 (two) times daily as needed for muscle spasms. 03/29/18   Cristie Hem, PA-C  pravastatin (PRAVACHOL) 80 MG tablet Take 1 tablet (80 mg total) by mouth daily. 04/19/18   Marykay Lex, MD  predniSONE (STERAPRED UNI-PAK 21 TAB) 10 MG (21) TBPK tablet Take as directed 03/29/18   Cristie Hem, PA-C  topiramate (TOPAMAX) 25 MG tablet Take 1 tablet (25 mg total) by mouth 2 (two) times daily. 03/28/18   Kallie Locks, FNP    Family History Family History  Problem Relation Age of Onset  . Kidney disease Maternal Grandmother   . Heart failure Maternal Grandmother   . Hypertension Mother   . Asthma Sister   . Coronary artery disease Father        CABG - in 7050s.  . Anesthesia problems Neg Hx   . Other Neg Hx     Social History Social History   Tobacco Use  . Smoking status: Current Some Day Smoker    Packs/day: 0.10    Years: 13.00    Pack years: 1.30    Types: Cigarettes    Last attempt to quit: 05/27/2017    Years since quitting: 0.9  . Smokeless tobacco: Never Used  Substance Use Topics  . Alcohol use: Yes    Comment: socially  . Drug use: No     Allergies   Zantac [ranitidine hcl]   Review of Systems Review of Systems  Eyes: Positive for pain.  All other systems reviewed and are  negative.    Physical Exam Updated Vital Signs BP 119/79   Pulse 78   Temp 98.6 F (37 C) (Oral)   Resp 16   Ht 5\' 4"  (1.626 m)   Wt 70 kg   SpO2 100%   BMI 26.49 kg/m   Physical Exam  Constitutional: She is oriented to person, place, and time. She appears well-developed and well-nourished. No distress.  HENT:  Head: Normocephalic and atraumatic.  Eyes: Pupils are equal, round, and reactive to light. EOM are normal. Left conjunctiva has a hemorrhage.    Pressure in the left eye is 18  Cardiovascular: Normal rate.  Pulmonary/Chest: Effort normal.  Musculoskeletal: Normal range of motion. She exhibits no tenderness.  No edema  Neurological: She is alert and oriented to person, place, and time. No cranial nerve deficit.  Skin: Skin is warm and dry. No rash noted.  Psychiatric: She has a normal mood and affect. Her behavior is normal.  Nursing note and vitals reviewed.    ED Treatments / Results  Labs (all labs ordered are listed, but only abnormal results are displayed) Labs Reviewed - No data to display  EKG None  Radiology No results found.  Procedures Procedures (including critical care time)  Medications Ordered in ED Medications  tetracaine (PONTOCAINE) 0.5 % ophthalmic solution 1 drop (1 drop Left Eye Given 05/04/18 1728)  fluorescein ophthalmic strip 1 strip (1 strip Left Eye Given 05/04/18 1728)     Initial Impression / Assessment and Plan / ED Course  I have reviewed the triage vital signs and the nursing notes.  Pertinent labs & imaging results that were available during my care of the patient were reviewed by me and considered in my medical decision making (see chart for details).    Patient presenting with nontraumatic subconjunctival hemorrhage in the left eye.  She denies any blurry vision or constant pain.  She states occasionally if she is straining her eye will have a mild discomfort.  Her blood pressure is within normal limits the pressure  in her eyes 18.  No other acute findings at this time.  Patient discharged home.  Final Clinical Impressions(s) / ED Diagnoses   Final diagnoses:  Subconjunctival hemorrhage of left eye    ED Discharge Orders    None       Gwyneth SproutPlunkett, Devika Dragovich, MD 05/04/18 [phone]

## 2018-05-09 ENCOUNTER — Ambulatory Visit (INDEPENDENT_AMBULATORY_CARE_PROVIDER_SITE_OTHER): Payer: Medicaid Other | Admitting: Family Medicine

## 2018-05-09 ENCOUNTER — Encounter: Payer: Self-pay | Admitting: Family Medicine

## 2018-05-09 VITALS — BP 119/72 | HR 66 | Temp 97.9°F | Ht 64.0 in | Wt 160.4 lb

## 2018-05-09 DIAGNOSIS — N920 Excessive and frequent menstruation with regular cycle: Secondary | ICD-10-CM

## 2018-05-09 DIAGNOSIS — E785 Hyperlipidemia, unspecified: Secondary | ICD-10-CM | POA: Diagnosis not present

## 2018-05-09 DIAGNOSIS — I10 Essential (primary) hypertension: Secondary | ICD-10-CM

## 2018-05-09 DIAGNOSIS — Z09 Encounter for follow-up examination after completed treatment for conditions other than malignant neoplasm: Secondary | ICD-10-CM | POA: Diagnosis not present

## 2018-05-09 MED ORDER — NAPROXEN 500 MG PO TABS: 500.0000 mg | ORAL_TABLET | Freq: Two times a day (BID) | ORAL | 3 refills | Status: DC

## 2018-05-09 NOTE — Patient Instructions (Signed)
Naproxen Sodium oral tablet, extended-release What is this medicine? NAPROXEN (na PROX en) is a non-steroidal anti-inflammatory drug (NSAID). It is used to reduce swelling and to treat pain. This medicine may be used for dental pain, headache, or painful monthly periods. It is also used for painful joint and muscular problems such as arthritis, tendinitis, bursitis, and gout. This medicine may be used for other purposes; ask your health care provider or pharmacist if you have questions. COMMON BRAND NAME(S): Midol Extended Relief, Naprelan Dose Card What should I tell my health care provider before I take this medicine? They need to know if you have any of these conditions: -asthma -cigarette smoker -drink more than 3 alcohol containing drinks a day -heart disease or circulation problems such as heart failure or leg edema (fluid retention) -high blood pressure -kidney disease -liver disease -stomach bleeding or ulcers -an unusual or allergic reaction to naproxen, aspirin, other NSAIDs, other medicines, foods, dyes, or preservatives -pregnant or trying to get pregnant -breast-feeding How should I use this medicine? Take this medicine by mouth with a glass of water. Follow the directions on the prescription label. Take this medicine with food if it upsets your stomach. Try to not lie down for at least 10 minutes after you take it. Take your medicine at regular intervals. Do not take your medicine more often than directed. Long-term, continuous use may increase the risk of heart attack or stroke. A special MedGuide will be given to you by the pharmacist with each prescription and refill. Be sure to read this information carefully each time. Talk to your pediatrician regarding the use of this medicine in children. Special care may be needed. Overdosage: If you think you have taken too much of this medicine contact a poison control center or emergency room at once. NOTE: This medicine is only for  you. Do not share this medicine with others. What if I miss a dose? If you miss a dose, take it as soon as you can. If it is almost time for your next dose, take only that dose. Do not take double or extra doses. What may interact with this medicine? -alcohol -aspirin -cidofovir -diuretics -lithium -methotrexate -other drugs for inflammation like ketorolac or prednisone -pemetrexed -probenecid -warfarin This list may not describe all possible interactions. Give your health care provider a list of all the medicines, herbs, non-prescription drugs, or dietary supplements you use. Also tell them if you smoke, drink alcohol, or use illegal drugs. Some items may interact with your medicine. What should I watch for while using this medicine? Tell your doctor or health care professional if your pain does not get better. Talk to your doctor before taking another medicine for pain. Do not treat yourself. This medicine does not prevent heart attack or stroke. In fact, this medicine may increase the chance of a heart attack or stroke. The chance may increase with longer use of this medicine and in people who have heart disease. If you take aspirin to prevent heart attack or stroke, talk with your doctor or health care professional. Do not take other medicines that contain aspirin, ibuprofen, or naproxen with this medicine. Side effects such as stomach upset, nausea, or ulcers may be more likely to occur. Many medicines available without a prescription should not be taken with this medicine. This medicine can cause ulcers and bleeding in the stomach and intestines at any time during treatment. Do not smoke cigarettes or drink alcohol. These increase irritation to your stomach and can   make it more susceptible to damage from this medicine. Ulcers and bleeding can happen without warning symptoms and can cause death. You may get drowsy or dizzy. Do not drive, use machinery, or do anything that needs mental  alertness until you know how this medicine affects you. Do not stand or sit up quickly, especially if you are an older patient. This reduces the risk of dizzy or fainting spells. This medicine can cause you to bleed more easily. Try to avoid damage to your teeth and gums when you brush or floss your teeth. What side effects may I notice from receiving this medicine? Side effects that you should report to your doctor or health care professional as soon as possible: -black or bloody stools, blood in the urine or vomit -blurred vision -chest pain -difficulty breathing or wheezing -nausea or vomiting -severe stomach pain -skin rash, skin redness, blistering or peeling skin, hives, or itching -slurred speech or weakness on one side of the body -swelling of eyelids, throat, lips -unexplained weight gain or swelling -unusually weak or tired -yellowing of eyes or skin Side effects that usually do not require medical attention (report to your doctor or health care professional if they continue or are bothersome): -constipation -headache -heartburn This list may not describe all possible side effects. Call your doctor for medical advice about side effects. You may report side effects to FDA at 1-800-FDA-1088. Where should I keep my medicine? Keep out of the reach of children. Store at room temperature between 20 and 25 degrees C (68 and 77 degrees F). Keep container tightly closed. Throw away any unused medicine after the expiration date. NOTE: This sheet is a summary. It may not cover all possible information. If you have questions about this medicine, talk to your doctor, pharmacist, or health care provider.  2018 Elsevier/Gold Standard (2009-06-01 20:26:54)  

## 2018-05-09 NOTE — Progress Notes (Signed)
Follow Up  Subjective:    Patient ID: Andrea Stone, female    DOB: 05-06-1981, 37 y.o.   MRN: [id number]  Chief Complaint  Patient presents with  . Hospitalization Follow-up    eye infection   HPI  Andrea Stone is a 37 female with a past medical history of UTI, Trichomoniasis, Hypertension, and Hyperlipidemia. She is here today for follow up.    Current Status: Since her last office visit, she is doing well. She does have c/o heavy flow with her menstrual periods. She has cramps occasional. Her menstrual cycle is on today. She is accompanied today by her 3 children. She denies visual changes, chest pain, cough, shortness of breath, heart palpitations, and falls. She has occasionally headaches and dizziness with position changes. Denies severe headaches, confusion, seizures, double vision, and blurred vision, nausea and vomiting.  She denies fevers, chills, fatigue, recent infections, weight loss, and night sweats. No reports of GI problems such as nausea, vomiting, diarrhea, and constipation. She has no reports of blood in stools, dysuria and hematuria. No depression or anxiety reported. She denies pain today.   Past Medical History:  Diagnosis Date  . Hyperlipidemia   . Hypertension   . Trichomoniasis   . UTI (urinary tract infection) during pregnancy     Family History  Problem Relation Age of Onset  . Kidney disease Maternal Grandmother   . Heart failure Maternal Grandmother   . Hypertension Mother   . Asthma Sister   . Coronary artery disease Father        CABG - in 75s.  . Anesthesia problems Neg Hx   . Other Neg Hx     Social History   Socioeconomic History  . Marital status: Single    Spouse name: Not on file  . Number of children: 3  . Years of education: Not on file  . Highest education level: High school graduate  Occupational History  . Not on file  Social Needs  . Financial resource strain: Not on file  . Food insecurity:    Worry: Not on file     Inability: Not on file  . Transportation needs:    Medical: Not on file    Non-medical: Not on file  Tobacco Use  . Smoking status: Current Some Day Smoker    Packs/day: 0.10    Years: 13.00    Pack years: 1.30    Types: Cigarettes    Last attempt to quit: 05/27/2017    Years since quitting: 0.9  . Smokeless tobacco: Never Used  Substance and Sexual Activity  . Alcohol use: Yes    Comment: socially  . Drug use: No  . Sexual activity: Yes    Birth control/protection: None  Lifestyle  . Physical activity:    Days per week: Not on file    Minutes per session: Not on file  . Stress: Not on file  Relationships  . Social connections:    Talks on phone: Not on file    Gets together: Not on file    Attends religious service: Not on file    Active member of club or organization: Not on file    Attends meetings of clubs or organizations: Not on file    Relationship status: Not on file  . Intimate partner violence:    Fear of current or ex partner: Not on file    Emotionally abused: Not on file    Physically abused: Not on file    Forced sexual  activity: Not on file  Other Topics Concern  . Not on file  Social History Narrative   Disabled - (since childhood).    Past Surgical History:  Procedure Laterality Date  . CARDIOPULMONARY EXERCISE TEST  07/2017    No regional WMA. No valvular lesions. cardiopulmonary limitation seen. Peak VO2 normal for workload achieved on submaximal test. Dyspnea most likely related to deconditioning & limitations due to weight. Normal Study from a Heart & Lung standpoint.  Marland Kitchen LAPAROSCOPIC TUBAL LIGATION Bilateral 02/06/2013   Procedure: LAPAROSCOPIC TUBAL LIGATION;  Surgeon: Kathreen Cosier, MD;  Location: [facility];  Service: Gynecology;  Laterality: Bilateral;  . TRANSTHORACIC ECHOCARDIOGRAM  07/2017   EF 60-65%.  No regional WMA. No valvular lesions.  . WISDOM TOOTH EXTRACTION      Immunization History  Administered Date(s) Administered  .  Influenza,inj,Quad PF,6+ Mos 06/20/2017  . Tdap 01/24/2012, 11/15/2012    Current Meds  Medication Sig  . aspirin 81 MG EC tablet TAKE 1 TABLET BY MOUTH EVERY DAY  . esomeprazole (NEXIUM) 20 MG capsule TAKE 1 CAPSULE DAILY AT LEAST 1 HOUR BEFORE A MEAL SWALLOWING WHOLE. DO NOT CRUSH/CHEW GRANULES.  . ferrous sulfate 325 (65 FE) MG tablet Take 1 tablet (325 mg total) by mouth daily.  . pravastatin (PRAVACHOL) 80 MG tablet Take 1 tablet (80 mg total) by mouth daily.  Marland Kitchen topiramate (TOPAMAX) 25 MG tablet Take 1 tablet (25 mg total) by mouth 2 (two) times daily.    Allergies  Allergen Reactions  . Zantac [Ranitidine Hcl] Swelling and Rash    BP 119/72 (BP Location: Left Arm, Patient Position: Sitting, Cuff Size: Small)   Pulse 66   Temp 97.9 F (36.6 C)   Ht 5\' 4"  (1.626 m)   Wt 160 lb 6.4 oz (72.8 kg)   LMP 05/07/2018   SpO2 100%   BMI 27.53 kg/m   Review of Systems  Constitutional: Negative.   HENT: Negative.   Eyes: Negative.   Respiratory: Negative.   Cardiovascular: Negative.   Gastrointestinal: Negative.   Endocrine: Negative.   Genitourinary: Negative.   Musculoskeletal: Negative.   Skin: Negative.   Allergic/Immunologic: Negative.   Neurological: Negative.   Hematological: Negative.   Psychiatric/Behavioral: Negative.    Objective:   Physical Exam  Constitutional: She is oriented to person, place, and time. She appears well-developed and well-nourished.  HENT:  Head: Normocephalic and atraumatic.  Eyes: Pupils are equal, round, and reactive to light. Conjunctivae and EOM are normal.  Neck: Normal range of motion. Neck supple.  Cardiovascular: Normal rate, regular rhythm, normal heart sounds and intact distal pulses.  Pulmonary/Chest: Effort normal and breath sounds normal.  Abdominal: Soft. Bowel sounds are normal.  Musculoskeletal: Normal range of motion.  Neurological: She is alert and oriented to person, place, and time.  Skin: Skin is warm.   Psychiatric: She has a normal mood and affect. Her behavior is normal. Judgment and thought content normal.  Nursing note and vitals reviewed.  Assessment & Plan:   1. Essential hypertension Blood pressure is stable at 119/72 today. She will continue to decrease high sodium intake, excessive alcohol intake, increase potassium intake, smoking cessation, and increase physical activity of at least 30 minutes of cardio activity daily. She will continue to follow Heart Healthy or DASH diet.  2. Hyperlipidemia, unspecified hyperlipidemia type Lipid panel revealed elevated LDL and total cholesterol on 01/10/2018. She will continue Pravastatin as prescribed. Continue low-fat diet.  3. Menorrhagia with regular cycle We will  initiate Naproxen today.  - naproxen (NAPROSYN) 500 MG tablet; Take 1 tablet (500 mg total) by mouth 2 (two) times daily with a meal.  Dispense: 30 tablet; Refill: 3  4. Follow up She will follow up in 2 months.  We will re-assess restarting Depo Provera at next office visit.   Meds ordered this encounter  Medications  . naproxen (NAPROSYN) 500 MG tablet    Sig: Take 1 tablet (500 mg total) by mouth 2 (two) times daily with a meal.    Dispense:  30 tablet    Refill:  3    Raliegh IpNatalie Mikeala Girdler,  MSN, FNP-C [facility] [address]  [place], [place] [id number] [phone]

## 2018-05-17 ENCOUNTER — Telehealth: Payer: Self-pay

## 2018-05-17 NOTE — Telephone Encounter (Signed)
Patient states that insurance will not cover the Naproxen for menstrual cramps and wants to know what else can be sent in. Patient is aware that provider is out of office until 05/21/2018.

## 2018-05-22 ENCOUNTER — Encounter: Payer: Self-pay | Admitting: Physical Therapy

## 2018-05-22 ENCOUNTER — Ambulatory Visit: Payer: Medicaid Other | Attending: Orthopaedic Surgery | Admitting: Physical Therapy

## 2018-05-22 DIAGNOSIS — M5416 Radiculopathy, lumbar region: Secondary | ICD-10-CM

## 2018-05-22 DIAGNOSIS — R293 Abnormal posture: Secondary | ICD-10-CM | POA: Insufficient documentation

## 2018-05-22 DIAGNOSIS — M6281 Muscle weakness (generalized): Secondary | ICD-10-CM

## 2018-05-22 NOTE — Therapy (Addendum)
[place], [place], [address] Phone: [phone]   Fax:  [phone]  Physical Therapy Treatment  Patient Details  Name: Andrea Stone MRN: [id number] Date of Birth: 05-19-1981 Referring Provider (PT): Andrea Shown   MD   Encounter Date: 05/22/2018  PT End of Session - 05/22/18 0856    Visit Number  4    Number of Visits  12    Date for PT Re-Evaluation  06/22/18    Authorization Type  Medicaid     Authorization Time Period  05/03/2018-06/01/2018    Authorization - Visit Number  2    Authorization - Number of Visits  3    PT Start Time  0851    PT Stop Time  0939    PT Time Calculation (min)  48 min    Activity Tolerance  Patient tolerated treatment well    Behavior During Therapy  [facility] for tasks assessed/performed       Past Medical History:  Diagnosis Date  . Hyperlipidemia   . Hypertension   . Trichomoniasis   . UTI (urinary tract infection) during pregnancy     Past Surgical History:  Procedure Laterality Date  . CARDIOPULMONARY EXERCISE TEST  07/2017    No regional WMA. No valvular lesions. cardiopulmonary limitation seen. Peak VO2 normal for workload achieved on submaximal test. Dyspnea most likely related to deconditioning & limitations due to weight. Normal Study from a Heart & Lung standpoint.  Marland Kitchen LAPAROSCOPIC TUBAL LIGATION Bilateral 02/06/2013   Procedure: LAPAROSCOPIC TUBAL LIGATION;  Surgeon: Andrea Hamman, MD;  Location: [facility];  Service: Gynecology;  Laterality: Bilateral;  . TRANSTHORACIC ECHOCARDIOGRAM  07/2017   EF 60-65%.  No regional WMA. No valvular lesions.  . WISDOM TOOTH EXTRACTION      There were no vitals filed for this visit.  Subjective Assessment - 05/22/18 0852    Currently in Pain?  Yes    Pain Score  5     Pain Location  Back    Pain Orientation  Mid;Lower    Pain Descriptors / Indicators  Aching    Pain Type  Chronic pain    Aggravating Factors   laying  on back, prolonged activity    Pain Relieving Factors  keep moving , heat,                        [facility] Adult PT Treatment/Exercise - 05/22/18 0001      Exercises   Exercises  Lumbar      Lumbar Exercises: Stretches   Active Hamstring Stretch  Right;Left;2 reps;30 seconds    Single Knee to Chest Stretch  Right;Left;2 reps;20 seconds    Quadruped Mid Back Stretch Limitations  childs pose x 60sec      Lumbar Exercises: Supine   Pelvic Tilt  15 reps    Bridge  10 reps    Bridge Limitations  with abdominal draw in     Straight Leg Raise  10 reps;2 seconds    Straight Leg Raises Limitations  with abdominal drraw in      Lumbar Exercises: Quadruped   Madcat/Old Horse  10 reps    Straight Leg Raise  --    Straight Leg Raises Limitations  --    Opposite Arm/Leg Raise  10 reps      Moist Heat Therapy   Number Minutes Moist Heat  10 Minutes    Moist Heat Location  Lumbar Spine  PT Education - 05/22/18 0924    Education Details  HEP    Person(s) Educated  Patient    Methods  Explanation;Handout    Comprehension  Verbalized understanding       PT Short Term Goals - 05/22/18 0901      PT SHORT TERM GOAL #1   Title  She will be independent with initial HEp     Time  2    Period  Weeks    Status  Achieved      PT SHORT TERM GOAL #2   Title  She will report 10% decr in leg symptoms and become more intermittant    Baseline  leg pain after 30 minutes of walking     Time  2    Period  Weeks    Status  Achieved        PT Long Term Goals - 05/22/18 2694      PT LONG TERM GOAL #1   Title  She will be indpendnent with all HEP issued    Baseline  independent with initial HEP    Time  6    Period  Weeks    Status  On-going      PT LONG TERM GOAL #2   Title  She will report no leg painfor 75% of day    Baseline  only leg pain after 30 min walking, otherwise no sx    Time  6    Period  Weeks    Status  On-going      PT LONG TERM GOAL #3    Title  She will be able to do activity oin feet for 30 min or more wiithout increased pain    Baseline  30 minutes then has leg pain.     Time  6    Period  Weeks      PT LONG TERM GOAL #4   Title  She will report able to sleep for 4 hours without waking due to back pain.    Baseline  , 1 hour    Time  6    Period  Weeks    Status  On-going            Plan - 05/22/18 8546    Clinical Impression Statement  Pt reports burning sensation in Bilat LE to calfs with ambulation greater than 30 minutes. She has been walking more for weight loss. Overall leg symptoms much improved from baseline. Back still limits sleep. STG# 1, #2 met.                                As she reports ome improvement she may benefit from extended PT for 6-8 session to assess possible im;provement.   Is is not likely she will be pain free . Andrea Stone PT   05/22/18    Clinical Impairments Affecting Rehab Potential  chronicity of problem , abdominal weakness.     PT Treatment/Interventions  Passive range of motion;Manual techniques;Patient/family education;Therapeutic activities;Therapeutic exercise;Cryotherapy;Moist Heat;Electrical Stimulation    PT Next Visit Plan  REveiew HEP . Progress HEP . STretching lumbar spine, modalities as needed    PT Home Exercise Plan  child's pose , PPT, hamstring stretches, knee to chest, bridge, SLR with ab brace, bird dog    Consulted and Agree with Plan of Care  Patient       Patient will benefit from skilled therapeutic intervention  in order to improve the following deficits and impairments:  Pain, Increased muscle spasms, Decreased activity tolerance  Visit Diagnosis: Radiculopathy, lumbar region  Abnormal posture  Muscle weakness (generalized)     Problem List Patient Active Problem List   Diagnosis Date Noted  . Hyperlipidemia with target LDL less than 100 04/19/2018  . Low back pain with bilateral sciatica 03/29/2018  . DOE (dyspnea on exertion) 07/28/2017   . Chest pain with low risk for cardiac etiology 07/28/2017  . Family history of premature coronary heart disease 07/28/2017    Andrea Stone, PT 05/22/2018, 6:51 PM For Andrea Stone PTA  [facility] [address] [place], [place], [address] Phone: [phone]   Fax:  [phone]  Name: Andrea Stone MRN: [id number] Date of Birth: 07/25/1980

## 2018-05-22 NOTE — Addendum Note (Signed)
Addended by: Caprice RedHASSE, Nickia Boesen M on: 05/22/2018 06:56 PM   Modules accepted: Orders

## 2018-05-23 NOTE — Telephone Encounter (Signed)
Please inform patient that she can take OTC Aleve (which is Naproxen) 440 mg (2 tablets) BID as needed for cramps. She can also take Motrin 400 mg- 800 mg every 6 hours at the beginning of menstrual period and for the duration of cycle as needed. Thank you.

## 2018-05-24 ENCOUNTER — Ambulatory Visit: Payer: Medicaid Other

## 2018-05-24 DIAGNOSIS — M5416 Radiculopathy, lumbar region: Secondary | ICD-10-CM | POA: Diagnosis not present

## 2018-05-24 DIAGNOSIS — R293 Abnormal posture: Secondary | ICD-10-CM

## 2018-05-24 DIAGNOSIS — M6281 Muscle weakness (generalized): Secondary | ICD-10-CM

## 2018-05-24 NOTE — Therapy (Addendum)
[place], [place], [address] Phone: [phone]   Fax:  [phone]  Physical Therapy Treatment/Discharge  Patient Details  Name: Andrea Stone MRN: [id number] Date of Birth: 01-Feb-1981 Referring Provider (PT): Frankey Shown   MD   Encounter Date: 05/24/2018  PT End of Session - 05/24/18 0832    Visit Number  5    Number of Visits  12    Date for PT Re-Evaluation  06/22/18    Authorization Type  Medicaid     Authorization Time Period  05/03/2018-06/01/2018    Authorization - Visit Number  3    Authorization - Number of Visits  3    PT Start Time  0830    PT Stop Time  0910    PT Time Calculation (min)  40 min    Activity Tolerance  Patient tolerated treatment well    Behavior During Therapy  [facility] for tasks assessed/performed       Past Medical History:  Diagnosis Date  . Hyperlipidemia   . Hypertension   . Trichomoniasis   . UTI (urinary tract infection) during pregnancy     Past Surgical History:  Procedure Laterality Date  . CARDIOPULMONARY EXERCISE TEST  07/2017    No regional WMA. No valvular lesions. cardiopulmonary limitation seen. Peak VO2 normal for workload achieved on submaximal test. Dyspnea most likely related to deconditioning & limitations due to weight. Normal Study from a Heart & Lung standpoint.  Marland Kitchen LAPAROSCOPIC TUBAL LIGATION Bilateral 02/06/2013   Procedure: LAPAROSCOPIC TUBAL LIGATION;  Surgeon: Frederico Hamman, MD;  Location: [facility];  Service: Gynecology;  Laterality: Bilateral;  . TRANSTHORACIC ECHOCARDIOGRAM  07/2017   EF 60-65%.  No regional WMA. No valvular lesions.  . WISDOM TOOTH EXTRACTION      There were no vitals filed for this visit.  Subjective Assessment - 05/24/18 0836    Subjective  no pain today. some better . Sleep on side for 2 hours. Walk 30 min  then leg pain starts.   thinks 10% better     Currently in Pain?  No/denies                        [facility] Adult PT Treatment/Exercise - 05/24/18 0001      Lumbar Exercises: Stretches   Passive Hamstring Stretch  Right;Left;2 reps    Passive Hamstring Stretch Limitations  strap    Single Knee to Chest Stretch  Right;Left;2 reps;30 seconds      Lumbar Exercises: Aerobic   Nustep  L4 UE/LE x 5 minutes         HEP reviewed in entireity. She needed cues to initiate all exer.        PT Short Term Goals - 05/24/18 0836      PT SHORT TERM GOAL #1   Title  She will be independent with initial HEp     Status  Achieved      PT SHORT TERM GOAL #2   Title  She will report 10% decr in leg symptoms and become more intermittant    Baseline  leg pain after 30 minutes of walking no change, maybe 10% better    Status  Achieved        PT Long Term Goals - 05/24/18 5462      PT LONG TERM GOAL #1   Title  She will be indpendnent with all HEP issued    Baseline  independent with  initial HEP    Status  On-going      PT LONG TERM GOAL #2   Title  She will report no leg painfor 75% of day    Baseline  only leg pain after 30 min walking, otherwise no sx    Status  On-going      PT LONG TERM GOAL #3   Title  She will be able to do activity on feet for 30 min or more wiithout increased pain    Baseline  30 minutes then has leg pain.     Status  On-going      PT LONG TERM GOAL #4   Title  She will report able to sleep for 4 hours without waking due to back pain.    Baseline  2 hours    Status  On-going            Plan - 05/24/18 5573    Clinical Impression Statement  Her HEP was reviewed. She was able to do each correctly after reminder of what the exercise entailed.  Unclear if she is better  as she was not emphatic when asked if she was better.  We need to progress her HEP for vcore strength    PT Frequency  2x / week    PT Duration  4 weeks    PT Treatment/Interventions  Passive range of motion;Manual techniques;Patient/family  education;Therapeutic activities;Therapeutic exercise;Cryotherapy;Moist Heat;Electrical Stimulation    PT Next Visit Plan  . Progress flexion based HEP . STretching lumbar spine, modalities as needed    PT Home Exercise Plan  child's pose , PPT, hamstring stretches, knee to chest, bridge, SLR with ab brace, bird dog    Consulted and Agree with Plan of Care  Patient       Patient will benefit from skilled therapeutic intervention in order to improve the following deficits and impairments:  Pain, Increased muscle spasms, Decreased activity tolerance  Visit Diagnosis: Radiculopathy, lumbar region  Abnormal posture  Muscle weakness (generalized)     Problem List Patient Active Problem List   Diagnosis Date Noted  . Hyperlipidemia with target LDL less than 100 04/19/2018  . Low back pain with bilateral sciatica 03/29/2018  . DOE (dyspnea on exertion) 07/28/2017  . Chest pain with low risk for cardiac etiology 07/28/2017  . Family history of premature coronary heart disease 07/28/2017    Darrel Hoover  PT 05/24/2018, 9:13 AM  [facility] [address] [place], [place], [address] Phone: [phone]   Fax:  [phone]  Name: Andrea Stone MRN: [id number] Date of Birth: 27-Oct-1980  PHYSICAL THERAPY DISCHARGE SUMMARY  Visits from Start of Care: 5  Current functional level related to goals / functional outcomes: Unknown as she canceled appointments after this session due to no insurance   Remaining deficits: Unknown  Education / Equipment: HEP Plan: Patient agrees to discharge.  Patient goals were not met. Patient is being discharged due to financial reasons.  ?????   Pearson Forster PT  07/18/2018

## 2018-05-24 NOTE — Telephone Encounter (Signed)
Patient notified with treatment plan.

## 2018-05-29 ENCOUNTER — Ambulatory Visit: Payer: Medicaid Other | Admitting: Physical Therapy

## 2018-05-31 ENCOUNTER — Ambulatory Visit: Payer: Medicaid Other | Admitting: Physical Therapy

## 2018-06-04 ENCOUNTER — Encounter: Payer: Medicaid Other | Admitting: Physical Therapy

## 2018-06-05 ENCOUNTER — Ambulatory Visit: Payer: Medicaid Other

## 2018-06-07 ENCOUNTER — Ambulatory Visit: Payer: Medicaid Other

## 2018-06-11 ENCOUNTER — Ambulatory Visit: Payer: Medicaid Other

## 2018-07-09 ENCOUNTER — Ambulatory Visit: Payer: Medicaid Other | Admitting: Family Medicine

## 2018-07-13 ENCOUNTER — Ambulatory Visit: Payer: Medicaid Other | Admitting: Family Medicine

## 2018-07-18 ENCOUNTER — Ambulatory Visit (INDEPENDENT_AMBULATORY_CARE_PROVIDER_SITE_OTHER): Payer: Medicaid Other | Admitting: Family Medicine

## 2018-07-18 ENCOUNTER — Encounter: Payer: Self-pay | Admitting: Family Medicine

## 2018-07-18 VITALS — BP 132/84 | HR 93 | Temp 98.0°F | Ht 64.0 in | Wt 159.0 lb

## 2018-07-18 DIAGNOSIS — I10 Essential (primary) hypertension: Secondary | ICD-10-CM | POA: Diagnosis not present

## 2018-07-18 DIAGNOSIS — J00 Acute nasopharyngitis [common cold]: Secondary | ICD-10-CM | POA: Insufficient documentation

## 2018-07-18 DIAGNOSIS — B9789 Other viral agents as the cause of diseases classified elsewhere: Secondary | ICD-10-CM | POA: Insufficient documentation

## 2018-07-18 DIAGNOSIS — Z09 Encounter for follow-up examination after completed treatment for conditions other than malignant neoplasm: Secondary | ICD-10-CM | POA: Diagnosis not present

## 2018-07-18 DIAGNOSIS — J028 Acute pharyngitis due to other specified organisms: Secondary | ICD-10-CM

## 2018-07-18 NOTE — Progress Notes (Signed)
[facility]  Sick Visit   Subjective:  Patient ID: Andrea Stone, female    DOB: 04/28/1981  Age: 38 y.o. MRN: [id number]  CC:  Chief Complaint  Patient presents with  . Nasal Congestion  . Cough  . Chills    HPI Andrea Stone is a 38 year old female who presents today for Sick Visit.   Past Medical History:  Diagnosis Date  . Hyperlipidemia   . Hypertension   . Trichomoniasis   . UTI (urinary tract infection) during pregnancy    Current Status: Since her last office visit, she has c/o of flu-like symptoms including chills, headaches, body aches, and headaches X 4 days. She has been taking OTC cold medications for relief. She states that she is beginning to feel better. She she has mild sore throat. She denies fevers, chills, fatigue, recent infections, weight loss, and night sweats. She is accompanied by her son today.   She has not had any headaches, visual changes, dizziness, and falls. No chest pain, heart palpitations, cough and shortness of breath reported. No reports of GI problems such as nausea, vomiting, diarrhea, and constipation. She has no reports of blood in stools, dysuria and hematuria. Her anxiety is mild today. She denies suicidal ideations, homicidal ideations, or auditory hallucinations. She denies pain today.    Past Surgical History:  Procedure Laterality Date  . CARDIOPULMONARY EXERCISE TEST  07/2017    No regional WMA. No valvular lesions. cardiopulmonary limitation seen. Peak VO2 normal for workload achieved on submaximal test. Dyspnea most likely related to deconditioning & limitations due to weight. Normal Study from a Heart & Lung standpoint.  Marland Kitchen. LAPAROSCOPIC TUBAL LIGATION Bilateral 02/06/2013   Procedure: LAPAROSCOPIC TUBAL LIGATION;  Surgeon: Kathreen CosierBernard A Marshall, MD;  Location: [facility];  Service: Gynecology;  Laterality: Bilateral;  . TRANSTHORACIC ECHOCARDIOGRAM  07/2017   EF 60-65%.  No regional WMA. No  valvular lesions.  . WISDOM TOOTH EXTRACTION      Family History  Problem Relation Age of Onset  . Kidney disease Maternal Grandmother   . Heart failure Maternal Grandmother   . Hypertension Mother   . Asthma Sister   . Coronary artery disease Father        CABG - in 3750s.  . Anesthesia problems Neg Hx   . Other Neg Hx     Social History   Socioeconomic History  . Marital status: Single    Spouse name: Not on file  . Number of children: 3  . Years of education: Not on file  . Highest education level: High school graduate  Occupational History  . Not on file  Social Needs  . Financial resource strain: Not on file  . Food insecurity:    Worry: Not on file    Inability: Not on file  . Transportation needs:    Medical: Not on file    Non-medical: Not on file  Tobacco Use  . Smoking status: Current Some Day Smoker    Packs/day: 0.10    Years: 13.00    Pack years: 1.30    Types: Cigarettes    Last attempt to quit: 05/27/2017    Years since quitting: 1.1  . Smokeless tobacco: Never Used  Substance and Sexual Activity  . Alcohol use: Yes    Comment: socially  . Drug use: No  . Sexual activity: Yes    Birth control/protection: None  Lifestyle  . Physical activity:    Days  per week: Not on file    Minutes per session: Not on file  . Stress: Not on file  Relationships  . Social connections:    Talks on phone: Not on file    Gets together: Not on file    Attends religious service: Not on file    Active member of club or organization: Not on file    Attends meetings of clubs or organizations: Not on file    Relationship status: Not on file  . Intimate partner violence:    Fear of current or ex partner: Not on file    Emotionally abused: Not on file    Physically abused: Not on file    Forced sexual activity: Not on file  Other Topics Concern  . Not on file  Social History Narrative   Disabled - (since childhood).    Outpatient Medications Prior to Visit    Medication Sig Dispense Refill  . aspirin 81 MG EC tablet TAKE 1 TABLET BY MOUTH EVERY DAY 90 tablet 1  . esomeprazole (NEXIUM) 20 MG capsule TAKE 1 CAPSULE DAILY AT LEAST 1 HOUR BEFORE A MEAL SWALLOWING WHOLE. DO NOT CRUSH/CHEW GRANULES. 30 capsule 2  . ferrous sulfate 325 (65 FE) MG tablet Take 1 tablet (325 mg total) by mouth daily. 30 tablet 3  . naproxen (NAPROSYN) 500 MG tablet Take 1 tablet (500 mg total) by mouth 2 (two) times daily with a meal. 30 tablet 3  . pravastatin (PRAVACHOL) 80 MG tablet Take 1 tablet (80 mg total) by mouth daily. 90 tablet 3  . topiramate (TOPAMAX) 25 MG tablet Take 1 tablet (25 mg total) by mouth 2 (two) times daily. 60 tablet 1   No facility-administered medications prior to visit.     Allergies  Allergen Reactions  . Zantac [Ranitidine Hcl] Swelling and Rash    ROS Review of Systems  Constitutional: Negative.   HENT: Positive for congestion.        Head cold  Eyes: Negative.   Respiratory: Negative.   Cardiovascular: Negative.   Gastrointestinal: Negative.   Endocrine: Negative.   Genitourinary: Negative.   Musculoskeletal: Negative.   Skin: Negative.   Allergic/Immunologic: Negative.   Neurological: Negative.   Hematological: Negative.   Psychiatric/Behavioral: Negative.    Objective:    Physical Exam  Constitutional: She is oriented to person, place, and time. She appears well-developed and well-nourished.  HENT:  Mouth/Throat:    Eyes: Conjunctivae are normal.  Neck: Normal range of motion. Neck supple.  Cardiovascular: Normal rate, regular rhythm, normal heart sounds and intact distal pulses.  Pulmonary/Chest: Effort normal and breath sounds normal.  Abdominal: Soft. Bowel sounds are normal.  Musculoskeletal: Normal range of motion.  Neurological: She is alert and oriented to person, place, and time. She has normal reflexes.  Skin: Skin is warm and dry.  Psychiatric: She has a normal mood and affect. Her behavior is normal.  Judgment and thought content normal.  Nursing note and vitals reviewed.   BP 132/84 (BP Location: Left Arm, Patient Position: Sitting, Cuff Size: Small)   Pulse 93   Temp 98 F (36.7 C) (Oral)   Ht 5\' 4"  (1.626 m)   Wt 159 lb (72.1 kg)   SpO2 99%   BMI 27.29 kg/m  Wt Readings from Last 3 Encounters:  07/18/18 159 lb (72.1 kg)  05/09/18 160 lb 6.4 oz (72.8 kg)  05/04/18 154 lb 5.2 oz (70 kg)     There are no preventive care reminders  to display for this patient.  There are no preventive care reminders to display for this patient.  Lab Results  Component Value Date   TSH 0.671 01/10/2018   Lab Results  Component Value Date   WBC 7.6 03/28/2018   HGB 9.8 (L) 03/28/2018   HCT 31.2 (L) 03/28/2018   MCV 78 (L) 03/28/2018   PLT 405 03/28/2018   Lab Results  Component Value Date   NA 137 01/10/2018   K 4.2 01/10/2018   CO2 22 01/10/2018   GLUCOSE 84 01/10/2018   BUN 11 01/10/2018   CREATININE 0.88 01/10/2018   BILITOT 0.3 01/10/2018   ALKPHOS 59 01/10/2018   AST 15 01/10/2018   ALT 16 01/10/2018   PROT 7.0 01/10/2018   ALBUMIN 4.1 01/10/2018   CALCIUM 9.2 01/10/2018   ANIONGAP 11 07/16/2017   Lab Results  Component Value Date   CHOL 207 (H) 01/10/2018   Lab Results  Component Value Date   HDL 38 (L) 01/10/2018   Lab Results  Component Value Date   LDLCALC 152 (H) 01/10/2018   Lab Results  Component Value Date   TRIG 85 01/10/2018   Lab Results  Component Value Date   CHOLHDL 5.4 (H) 01/10/2018   Lab Results  Component Value Date   HGBA1C 5.6 01/10/2018   Assessment & Plan:   1. Common cold Continue OTC cold medications as directed. She will report to office if symptom do not improve or worsen.   2. Essential hypertension Blood pressure is stable at 132/84 today. She will continue to decrease high sodium intake, excessive alcohol intake, increase potassium intake, smoking cessation, and increase physical activity of at least 30 minutes of  cardio activity daily. She will continue to follow Heart Healthy or DASH diet.  3. Sore throat (viral) Gargle with warm water, use lozenges/drops, throat sprays, gargles, and teas are available for relief of pain related to pharyngitis. In addition, adjusting the temperature and texture of beverages and foods helps to relieve the pain of swallowing, which can limit hydration and caloric intake during acute pharyngitis.  4. Follow up She will follow up in 3 months.   No orders of the defined types were placed in this encounter.   Raliegh Ip,  MSN, FNP-C [facility] [address] [place], [place] [address] [phone]   Problem List Items Addressed This Visit    None    Visit Diagnoses    Common cold    -  Primary   Essential hypertension       Sore throat (viral)       Follow up          No orders of the defined types were placed in this encounter.   Follow-up: Return in about 3 months (around 10/16/2018).    Kallie Locks, FNP

## 2018-07-18 NOTE — Patient Instructions (Signed)
Sore Throat  When you have a sore throat, your throat may feel:  · Tender.  · Burning.  · Irritated.  · Scratchy.  · Painful when you swallow.  · Painful when you talk.  Many things can cause a sore throat, such as:  · An infection.  · Allergies.  · Dry air.  · Smoke or pollution.  · Radiation treatment.  · Gastroesophageal reflux disease (GERD).  · A tumor.  A sore throat can be the first sign of another sickness. It can happen with other problems, like:  · Coughing.  · Sneezing.  · Fever.  · Swelling in the neck.  Most sore throats go away without treatment.  Follow these instructions at home:         · Take over-the-counter medicines only as told by your doctor.  ? If your child has a sore throat, do not give your child aspirin.  · Drink enough fluids to keep your pee (urine) pale yellow.  · Rest when you feel you need to.  · To help with pain:  ? Sip warm liquids, such as broth, herbal tea, or warm water.  ? Eat or drink cold or frozen liquids, such as frozen ice pops.  ? Gargle with a salt-water mixture 3-4 times a day or as needed. To make a salt-water mixture, add ½-1 tsp (3-6 g) of salt to 1 cup (237 mL) of warm water. Mix it until you cannot see the salt anymore.  ? Suck on hard candy or throat lozenges.  ? Put a cool-mist humidifier in your bedroom at night.  ? Sit in the bathroom with the door closed for 5-10 minutes while you run hot water in the shower.  · Do not use any products that contain nicotine or tobacco, such as cigarettes, e-cigarettes, and chewing tobacco. If you need help quitting, ask your doctor.  · Wash your hands well and often with soap and water. If soap and water are not available, use hand sanitizer.  Contact a doctor if:  · You have a fever for more than 2-3 days.  · You keep having symptoms for more than 2-3 days.  · Your throat does not get better in 7 days.  · You have a fever and your symptoms suddenly get worse.  · Your child who is 3 months to 3 years old has a temperature of  102.2°F (39°C) or higher.  Get help right away if:  · You have trouble breathing.  · You cannot swallow fluids, soft foods, or your saliva.  · You have swelling in your throat or neck that gets worse.  · You keep feeling sick to your stomach (nauseous).  · You keep throwing up (vomiting).  Summary  · A sore throat is pain, burning, irritation, or scratchiness in the throat. Many things can cause a sore throat.  · Take over-the-counter medicines only as told by your doctor. Do not give your child aspirin.  · Drink plenty of fluids, and rest as needed.  · Contact a doctor if your symptoms get worse or your sore throat does not get better within 7 days.  This information is not intended to replace advice given to you by your health care provider. Make sure you discuss any questions you have with your health care provider.  Document Released: 03/08/2008 Document Revised: 10/30/2017 Document Reviewed: 10/30/2017  Elsevier Interactive Patient Education © 2019 [employer]

## 2018-08-31 ENCOUNTER — Encounter: Payer: Self-pay | Admitting: Family Medicine

## 2018-08-31 ENCOUNTER — Ambulatory Visit (INDEPENDENT_AMBULATORY_CARE_PROVIDER_SITE_OTHER): Payer: Medicaid Other | Admitting: Family Medicine

## 2018-08-31 ENCOUNTER — Other Ambulatory Visit: Payer: Self-pay

## 2018-08-31 VITALS — BP 130/82 | HR 70 | Temp 97.9°F | Ht 64.0 in | Wt 160.0 lb

## 2018-08-31 DIAGNOSIS — R8290 Unspecified abnormal findings in urine: Secondary | ICD-10-CM

## 2018-08-31 DIAGNOSIS — M549 Dorsalgia, unspecified: Secondary | ICD-10-CM

## 2018-08-31 DIAGNOSIS — Z09 Encounter for follow-up examination after completed treatment for conditions other than malignant neoplasm: Secondary | ICD-10-CM | POA: Diagnosis not present

## 2018-08-31 LAB — POCT URINALYSIS DIP (MANUAL ENTRY)
Bilirubin, UA: NEGATIVE
Glucose, UA: NEGATIVE mg/dL
Ketones, POC UA: NEGATIVE mg/dL
Nitrite, UA: NEGATIVE
Protein Ur, POC: NEGATIVE mg/dL
Spec Grav, UA: 1.025 (ref 1.010–1.025)
Urobilinogen, UA: 0.2 E.U./dL
pH, UA: 6.5 (ref 5.0–8.0)

## 2018-08-31 MED ORDER — METHOCARBAMOL 500 MG PO TABS: 500.0000 mg | ORAL_TABLET | Freq: Two times a day (BID) | ORAL | 2 refills | Status: DC | PRN

## 2018-08-31 MED ORDER — DICLOFENAC SODIUM 1 % TD GEL: 4.0000 g | Freq: Four times a day (QID) | TRANSDERMAL | 2 refills | Status: DC

## 2018-08-31 NOTE — Progress Notes (Signed)
[facility]  Sick Visit  Subjective:  Patient ID: Andrea Stone, female    DOB: 08/30/1980  Age: 38 y.o. MRN: [id number]  CC:  Chief Complaint  Patient presents with   Back Pain    HPI Andrea Stone is a 38 year old female who presents for Sick Visit today.   Past Medical History:  Diagnosis Date   Hyperlipidemia    Hypertension    Trichomoniasis    UTI (urinary tract infection) during pregnancy     Current Status: Since her last office visit, she continues to have increased back pain lately. She has received PT sessions and has completed them. She continues to have moderate pain despite using techniques learned at physical therapy. She denies visual changes, chest pain, cough, shortness of breath, heart palpitations, and falls. She has occasional headaches and dizziness with position changes. Denies severe headaches, confusion, seizures, double vision, and blurred vision, nausea and vomiting.  She denies fevers, chills, fatigue, recent infections, weight loss, and night sweats. No reports of GI problems such as diarrhea, and constipation. She has no reports of blood in stools, dysuria and hematuria. No depression or anxiety, and denies suicidal ideations, homicidal ideations, or auditory hallucinations.    Past Surgical History:  Procedure Laterality Date   CARDIOPULMONARY EXERCISE TEST  07/2017    No regional WMA. No valvular lesions. cardiopulmonary limitation seen. Peak VO2 normal for workload achieved on submaximal test. Dyspnea most likely related to deconditioning & limitations due to weight. Normal Study from a Heart & Lung standpoint.   LAPAROSCOPIC TUBAL LIGATION Bilateral 02/06/2013   Procedure: LAPAROSCOPIC TUBAL LIGATION;  Surgeon: Kathreen Cosier, MD;  Location: [facility];  Service: Gynecology;  Laterality: Bilateral;   TRANSTHORACIC ECHOCARDIOGRAM  07/2017   EF 60-65%.  No regional WMA. No valvular lesions.    WISDOM TOOTH EXTRACTION      Family History  Problem Relation Age of Onset   Kidney disease Maternal Grandmother    Heart failure Maternal Grandmother    Hypertension Mother    Asthma Sister    Coronary artery disease Father        CABG - in 70s.   Anesthesia problems Neg Hx    Other Neg Hx     Social History   Socioeconomic History   Marital status: Single    Spouse name: Not on file   Number of children: 3   Years of education: Not on file   Highest education level: High school graduate  Occupational History   Not on file  Social Needs   Financial resource strain: Not on file   Food insecurity:    Worry: Not on file    Inability: Not on file   Transportation needs:    Medical: Not on file    Non-medical: Not on file  Tobacco Use   Smoking status: Current Some Day Smoker    Packs/day: 0.10    Years: 13.00    Pack years: 1.30    Types: Cigarettes    Last attempt to quit: 05/27/2017    Years since quitting: 1.2   Smokeless tobacco: Never Used  Substance and Sexual Activity   Alcohol use: Yes    Comment: socially   Drug use: No   Sexual activity: Yes    Birth control/protection: None  Lifestyle   Physical activity:    Days per week: Not on file    Minutes per session: Not on file   Stress:  Not on file  Relationships   Social connections:    Talks on phone: Not on file    Gets together: Not on file    Attends religious service: Not on file    Active member of club or organization: Not on file    Attends meetings of clubs or organizations: Not on file    Relationship status: Not on file   Intimate partner violence:    Fear of current or ex partner: Not on file    Emotionally abused: Not on file    Physically abused: Not on file    Forced sexual activity: Not on file  Other Topics Concern   Not on file  Social History Narrative   Disabled - (since childhood).    Outpatient Medications Prior to Visit  Medication Sig Dispense  Refill   aspirin 81 MG EC tablet TAKE 1 TABLET BY MOUTH EVERY DAY 90 tablet 1   ibuprofen (ADVIL,MOTRIN) 200 MG tablet Take 200 mg by mouth every 6 (six) hours as needed.     naproxen sodium (ALEVE) 220 MG tablet Take 220 mg by mouth.     pravastatin (PRAVACHOL) 80 MG tablet Take 1 tablet (80 mg total) by mouth daily. 90 tablet 3   topiramate (TOPAMAX) 25 MG tablet Take 1 tablet (25 mg total) by mouth 2 (two) times daily. 60 tablet 1   naproxen (NAPROSYN) 500 MG tablet Take 1 tablet (500 mg total) by mouth 2 (two) times daily with a meal. 30 tablet 3   ferrous sulfate 325 (65 FE) MG tablet Take 1 tablet (325 mg total) by mouth daily. 30 tablet 3   esomeprazole (NEXIUM) 20 MG capsule TAKE 1 CAPSULE DAILY AT LEAST 1 HOUR BEFORE A MEAL SWALLOWING WHOLE. DO NOT CRUSH/CHEW GRANULES. 30 capsule 2   No facility-administered medications prior to visit.     Allergies  Allergen Reactions   Zantac [Ranitidine Hcl] Swelling and Rash    ROS Review of Systems  Constitutional: Negative.   HENT: Negative.   Eyes: Negative.   Respiratory: Negative.   Cardiovascular: Negative.   Gastrointestinal: Negative.   Endocrine: Negative.   Genitourinary: Negative.   Musculoskeletal: Positive for back pain (chronic ).  Skin: Negative.   Allergic/Immunologic: Negative.   Neurological: Positive for dizziness and headaches.  Hematological: Negative.   Psychiatric/Behavioral: Negative.    Objective:    Physical Exam  Constitutional: She is oriented to person, place, and time. She appears well-developed and well-nourished.  HENT:  Head: Normocephalic and atraumatic.  Eyes: Conjunctivae are normal.  Neck: Normal range of motion. Neck supple.  Cardiovascular: Normal rate, regular rhythm, normal heart sounds and intact distal pulses.  Pulmonary/Chest: Effort normal and breath sounds normal.  Abdominal: Soft. Bowel sounds are normal.  Musculoskeletal: Normal range of motion.  Neurological: She is  alert and oriented to person, place, and time. She has normal reflexes.  Skin: Skin is warm and dry.  Psychiatric: She has a normal mood and affect. Her behavior is normal. Judgment and thought content normal.    BP 130/82 (BP Location: Left Arm, Patient Position: Sitting, Cuff Size: Small)    Pulse 70    Temp 97.9 F (36.6 C) (Oral)    Ht 5\' 4"  (1.626 m)    Wt 160 lb (72.6 kg)    LMP 08/27/2018    BMI 27.46 kg/m  Wt Readings from Last 3 Encounters:  08/31/18 160 lb (72.6 kg)  07/18/18 159 lb (72.1 kg)  05/09/18 160 lb  6.4 oz (72.8 kg)     Health Maintenance Due  Topic Date Due   INFLUENZA VACCINE  01/11/2018    There are no preventive care reminders to display for this patient.  Lab Results  Component Value Date   TSH 0.671 01/10/2018   Lab Results  Component Value Date   WBC 7.6 03/28/2018   HGB 9.8 (L) 03/28/2018   HCT 31.2 (L) 03/28/2018   MCV 78 (L) 03/28/2018   PLT 405 03/28/2018   Lab Results  Component Value Date   NA 137 01/10/2018   K 4.2 01/10/2018   CO2 22 01/10/2018   GLUCOSE 84 01/10/2018   BUN 11 01/10/2018   CREATININE 0.88 01/10/2018   BILITOT 0.3 01/10/2018   ALKPHOS 59 01/10/2018   AST 15 01/10/2018   ALT 16 01/10/2018   PROT 7.0 01/10/2018   ALBUMIN 4.1 01/10/2018   CALCIUM 9.2 01/10/2018   ANIONGAP 11 07/16/2017   Lab Results  Component Value Date   CHOL 207 (H) 01/10/2018   Lab Results  Component Value Date   HDL 38 (L) 01/10/2018   Lab Results  Component Value Date   LDLCALC 152 (H) 01/10/2018   Lab Results  Component Value Date   TRIG 85 01/10/2018   Lab Results  Component Value Date   CHOLHDL 5.4 (H) 01/10/2018   Lab Results  Component Value Date   HGBA1C 5.6 01/10/2018      Assessment & Plan:   1. Back pain, unspecified back location, unspecified back pain laterality, unspecified chronicity - POCT urinalysis dipstick - methocarbamol (ROBAXIN) 500 MG tablet; Take 1 tablet (500 mg total) by mouth 2 (two) times  daily as needed for muscle spasms.  Dispense: 60 tablet; Refill: 2 - diclofenac sodium (VOLTAREN) 1 % GEL; Apply 4 g topically 4 (four) times daily.  Dispense: 1 Tube; Refill: 2  2. Abnormal urinalysis Results are pending.  - Urine Culture  3. Follow up She will follow up in 3 months.   Meds ordered this encounter  Medications   methocarbamol (ROBAXIN) 500 MG tablet    Sig: Take 1 tablet (500 mg total) by mouth 2 (two) times daily as needed for muscle spasms.    Dispense:  60 tablet    Refill:  2   diclofenac sodium (VOLTAREN) 1 % GEL    Sig: Apply 4 g topically 4 (four) times daily.    Dispense:  1 Tube    Refill:  2    Orders Placed This Encounter  Procedures   Urine Culture   POCT urinalysis dipstick    Referral Orders  No referral(s) requested today     Raliegh Ip,  MSN, FNP-C [facility] [address] [place], [place] [address] [phone]   Problem List Items Addressed This Visit    None    Visit Diagnoses    Back pain, unspecified back location, unspecified back pain laterality, unspecified chronicity    -  Primary   Relevant Medications   ibuprofen (ADVIL,MOTRIN) 200 MG tablet   naproxen sodium (ALEVE) 220 MG tablet   methocarbamol (ROBAXIN) 500 MG tablet   diclofenac sodium (VOLTAREN) 1 % GEL   Other Relevant Orders   POCT urinalysis dipstick (Completed)   Abnormal urinalysis       Relevant Orders   Urine Culture   Follow up          Meds ordered this encounter  Medications   methocarbamol (ROBAXIN) 500 MG  tablet    Sig: Take 1 tablet (500 mg total) by mouth 2 (two) times daily as needed for muscle spasms.    Dispense:  60 tablet    Refill:  2   diclofenac sodium (VOLTAREN) 1 % GEL    Sig: Apply 4 g topically 4 (four) times daily.    Dispense:  1 Tube    Refill:  2    Follow-up: Return in about 3 months (around 12/01/2018).    Kallie Locks, FNP

## 2018-08-31 NOTE — Patient Instructions (Signed)
Chronic Back Pain When back pain lasts longer than 3 months, it is called chronic back pain.The cause of your back pain may not be known. Some common causes include:  Wear and tear (degenerative disease) of the bones, ligaments, or disks in your back.  Inflammation and stiffness in your back (arthritis). People who have chronic back pain often go through certain periods in which the pain is more intense (flare-ups). Many people can learn to manage the pain with home care. Follow these instructions at home: Pay attention to any changes in your symptoms. Take these actions to help with your pain: Activity   Avoid bending and other activities that make the problem worse.  Maintain a proper position when standing or sitting: ? When standing, keep your upper back and neck straight, with your shoulders pulled back. Avoid slouching. ? When sitting, keep your back straight and relax your shoulders. Do not round your shoulders or pull them backward.  Do not sit or stand in one place for long periods of time.  Take brief periods of rest throughout the day. This will reduce your pain. Resting in a lying or standing position is usually better than sitting to rest.  When you are resting for longer periods, mix in some mild activity or stretching between periods of rest. This will help to prevent stiffness and pain.  Get regular exercise. Ask your health care provider what activities are safe for you.  Do not lift anything that is heavier than 10 lb (4.5 kg). Always use proper lifting technique, which includes: ? Bending your knees. ? Keeping the load close to your body. ? Avoiding twisting.  Sleep on a firm mattress in a comfortable position. Try lying on your side with your knees slightly bent. If you lie on your back, put a pillow under your knees. Managing pain  If directed, apply ice to the painful area. Your health care provider may recommend applying ice during the first 24-48 hours after  a flare-up begins. ? Put ice in a plastic bag. ? Place a towel between your skin and the bag. ? Leave the ice on for 20 minutes, 2-3 times per day.  If directed, apply heat to the affected area as often as told by your health care provider. Use the heat source that your health care provider recommends, such as a moist heat pack or a heating pad. ? Place a towel between your skin and the heat source. ? Leave the heat on for 20-30 minutes. ? Remove the heat if your skin turns bright red. This is especially important if you are unable to feel pain, heat, or cold. You may have a greater risk of getting burned.  Try soaking in a warm tub.  Take over-the-counter and prescription medicines only as told by your health care provider.  Keep all follow-up visits as told by your health care provider. This is important. Contact a health care provider if:  You have pain that is not relieved with rest or medicine. Get help right away if:  You have weakness or numbness in one or both of your legs or feet.  You have trouble controlling your bladder or your bowels.  You have nausea or vomiting.  You have pain in your abdomen.  You have shortness of breath or you faint. This information is not intended to replace advice given to you by your health care provider. Make sure you discuss any questions you have with your health care provider. Document Released: 07/07/2004   Document Revised: 01/04/2018 Document Reviewed: 12/07/2016 Elsevier Interactive Patient Education  2019 [employer] Methocarbamol tablets What is this medicine? METHOCARBAMOL (meth oh KAR ba mole) helps to relieve pain and stiffness in muscles caused by strains, sprains, or other injury to your muscles. This medicine may be used for other purposes; ask your health care provider or pharmacist if you have questions. COMMON BRAND NAME(S): Robaxin What should I tell my health care provider before I take this medicine? They need to know  if you have any of these conditions: -kidney disease -seizures -an unusual or allergic reaction to methocarbamol, other medicines, foods, dyes, or preservatives -pregnant or trying to get pregnant -breast-feeding How should I use this medicine? Take this medicine by mouth with a full glass of water. Follow the directions on the prescription label. Take your medicine at regular intervals. Do not take your medicine more often than directed. Talk to your pediatrician regarding the use of this medicine in children. Special care may be needed. Overdosage: If you think you have taken too much of this medicine contact a poison control center or emergency room at once. NOTE: This medicine is only for you. Do not share this medicine with others. What if I miss a dose? If you miss a dose, take it as soon as you can. If it is almost time for your next dose, take only the next dose. Do not take double or extra doses. What may interact with this medicine? Do not take this medication with any of the following medicines: -narcotic medicines for cough This medicine may also interact with the following medications: -alcohol -antihistamines for allergy, cough and cold -certain medicines for anxiety or sleep -certain medicines for depression like amitriptyline, fluoxetine, sertraline -certain medicines for seizures like phenobarbital, primidone -cholinesterase inhibitors like neostigmine, ambenonium, and pyridostigmine bromide -general anesthetics like halothane, isoflurane, methoxyflurane, propofol -local anesthetics like lidocaine, pramoxine, tetracaine -medicines that relax muscles for surgery -narcotic medicines for pain -phenothiazines like chlorpromazine, mesoridazine, prochlorperazine, thioridazine This list may not describe all possible interactions. Give your health care provider a list of all the medicines, herbs, non-prescription drugs, or dietary supplements you use. Also tell them if you smoke,  drink alcohol, or use illegal drugs. Some items may interact with your medicine. What should I watch for while using this medicine? Tell your doctor or health care professional if your symptoms do not start to get better or if they get worse. You may get drowsy or dizzy. Do not drive, use machinery, or do anything that needs mental alertness until you know how this medicine affects you. Do not stand or sit up quickly, especially if you are an older patient. This reduces the risk of dizzy or fainting spells. Alcohol may interfere with the effect of this medicine. Avoid alcoholic drinks. If you are taking another medicine that also causes drowsiness, you may have more side effects. Give your health care provider a list of all medicines you use. Your doctor will tell you how much medicine to take. Do not take more medicine than directed. Call emergency for help if you have problems breathing or unusual sleepiness. What side effects may I notice from receiving this medicine? Side effects that you should report to your doctor or health care professional as soon as possible: -allergic reactions like skin rash, itching or hives, swelling of the face, lips, or tongue -breathing problems -confusion -seizures -unusually weak or tired Side effects that usually do not require medical attention (report to your doctor or  health care professional if they continue or are bothersome): -dizziness -headache -metallic taste -tiredness -upset stomach This list may not describe all possible side effects. Call your doctor for medical advice about side effects. You may report side effects to FDA at 1-800-FDA-1088. Where should I keep my medicine? Keep out of the reach of children. Store at room temperature between 20 and 25 degrees C (68 and 77 degrees F). Keep container tightly closed. Throw away any unused medicine after the expiration date. NOTE: This sheet is a summary. It may not cover all possible information.  If you have questions about this medicine, talk to your doctor, pharmacist, or health care provider.  2019 Elsevier/Gold Standard (2015-03-10 13:11:54) Diclofenac sodium topical solution What is this medicine? DICLOFENAC (dye KLOE fen ak) is a non-steroidal anti-inflammatory drug (NSAID). It is used to treat osteoarthritis of the knees. This medicine may be used for other purposes; ask your health care provider or pharmacist if you have questions. COMMON BRAND NAME(S): DICLOVIX, INFLAMMA-K, PENNSAID, VOPAC MDS What should I tell my health care provider before I take this medicine? They need to know if you have any of these conditions: -asthma -bleeding problems -coronary artery bypass graft (CABG) surgery within the past 2 weeks -heart disease -high blood pressure -if you frequently drink alcohol containing drinks -kidney disease -liver disease -open or infected skin -stomach problems -an unusual or allergic reaction to diclofenac, aspirin, other NSAIDs, other medicines, foods, dyes, or preservatives -pregnant or trying to get pregnant -breast-feeding How should I use this medicine? This medicine is for external use only. Follow the directions on the prescription label. Wash hands before and after use. Apply to the clean, dry skin of the knee. Rub around front, back, and sides of the knee. Do not apply to open wounds, infections, swelling, or areas of exfoliative dermatitis. Allow medicine to dry before using any other lotion or medicine on the same place. Do not get this medicine in your mouth or eyes. If this medicine gets in your eye, rinse out with plenty of cool tap water. Protect treatment area from sunlight and sun lamps. Use your doses at regular intervals. Do not use it more often than directed. A special MedGuide will be given to you by the pharmacist with each prescription and refill. Be sure to read this information carefully each time. Talk to your pediatrician regarding the  use of this medicine in children. Special care may be needed. Overdosage: If you think you have taken too much of this medicine contact a poison control center or emergency room at once. NOTE: This medicine is only for you. Do not share this medicine with others. What if I miss a dose? If you miss a dose, use it as soon as you can. If it is almost time for your next dose, use only that dose. Do not use double or extra doses. What may interact with this medicine? Do not take this medicine with any of the following medications: -cidofovir -ketorolac -methotrexate This medicine may also interact with the following medications: -aspirin -cyclosporine -lithium -medicines for blood pressure -medicines that treat or prevent blood clots like warfarin, enoxaparin, and dalteparin -NSAIDs, medicines for pain and inflammation, like ibuprofen or naproxen -other products used on the skin -steroid medicines like prednisone or cortisone This list may not describe all possible interactions. Give your health care provider a list of all the medicines, herbs, non-prescription drugs, or dietary supplements you use. Also tell them if you smoke, drink alcohol, or  use illegal drugs. Some items may interact with your medicine. What should I watch for while using this medicine? Tell your doctor or healthcare professional if your symptoms do not start to get better or if they get worse. This medicine can make you more sensitive to the sun. Keep out of the sun. If you cannot avoid being in the sun, wear protective clothing and use sunscreen. Do not use sun lamps or tanning beds/booths. Do not take medicines such as ibuprofen and naproxen with this medicine. Side effects such as stomach upset, nausea, or ulcers may be more likely to occur. Many medicines available without a prescription should not be taken with this medicine. This medicine does not prevent heart attack or stroke. In fact, this medicine may increase the  chance of a heart attack or stroke. The chance may increase with longer use of this medicine and in people who have heart disease. If you take aspirin to prevent heart attack or stroke, talk with your doctor or health care professional. This medicine can cause ulcers and bleeding in the stomach and intestines at any time during treatment. Ulcers and bleeding can happen without warning symptoms and can cause death. To reduce your risk, do not smoke cigarettes or drink alcohol while you are taking this medicine. This medicine can cause you to bleed more easily. Try to avoid damage to your teeth and gums when you brush or floss your teeth. What side effects may I notice from receiving this medicine? Side effects that you should report to your doctor or health care professional as soon as possible: -allergic reactions like skin rash, itching or hives, swelling of the face, lips, or tongue -black or bloody stools, blood in the urine or vomit -blurred vision -chest pain -difficulty breathing or wheezing -nausea or vomiting -redness, blistering, peeling or loosening of the skin, including inside the mouth -slurred speech or weakness on one side of the body -trouble passing urine or change in the amount of urine -unexplained weight gain or swelling -unusually weak or tired -yellowing of eyes or skin Side effects that usually do not require medical attention (report to your doctor or health care professional if they continue or are bothersome): -dizziness -dry skin -headache -increased sensitivity to the sun -stomach upset -tingling at the application This list may not describe all possible side effects. Call your doctor for medical advice about side effects. You may report side effects to FDA at 1-800-FDA-1088. Where should I keep my medicine? Keep out of the reach of children. Store at room temperature between 15 and 30 degrees C (59 and 86 degrees F). Keep container tightly closed. Throw away  any unused medicine after the expiration date. NOTE: This sheet is a summary. It may not cover all possible information. If you have questions about this medicine, talk to your doctor, pharmacist, or health care provider.  2019 Elsevier/Gold Standard (2015-07-02 09:11:01)

## 2018-09-02 LAB — URINE CULTURE

## 2018-09-03 ENCOUNTER — Telehealth: Payer: Self-pay

## 2018-09-03 NOTE — Telephone Encounter (Signed)
Patient wants to know if something else can be sent in because the Diclofenac Sodium cost to much

## 2018-09-04 ENCOUNTER — Other Ambulatory Visit: Payer: Self-pay | Admitting: Family Medicine

## 2018-09-04 DIAGNOSIS — B955 Unspecified streptococcus as the cause of diseases classified elsewhere: Secondary | ICD-10-CM

## 2018-09-04 MED ORDER — AMOXICILLIN 500 MG PO CAPS: 500.0000 mg | ORAL_CAPSULE | Freq: Two times a day (BID) | ORAL | 0 refills | Status: AC

## 2018-09-05 NOTE — Telephone Encounter (Signed)
Patient notified

## 2018-10-16 ENCOUNTER — Ambulatory Visit (INDEPENDENT_AMBULATORY_CARE_PROVIDER_SITE_OTHER): Payer: Medicaid Other | Admitting: Family Medicine

## 2018-10-16 ENCOUNTER — Other Ambulatory Visit: Payer: Self-pay

## 2018-10-16 DIAGNOSIS — I10 Essential (primary) hypertension: Secondary | ICD-10-CM

## 2018-10-16 DIAGNOSIS — M549 Dorsalgia, unspecified: Secondary | ICD-10-CM

## 2018-10-16 NOTE — Progress Notes (Signed)
Virtual Visit via Telephone Note  I connected with Andrea Stone on 10/16/18 at 10:40 AM EDT by telephone and verified that I am speaking with the correct person using two identifiers.   I discussed the limitations, risks, security and privacy concerns of performing an evaluation and management service by telephone and the availability of in person appointments. I also discussed with the patient that there may be a patient responsible charge related to this service. The patient expressed understanding and agreed to proceed.   History of Present Illness:  Past Medical History:  Diagnosis Date  . Hyperlipidemia   . Hypertension   . Trichomoniasis   . UTI (urinary tract infection) during pregnancy     Current Outpatient Medications on File Prior to Visit  Medication Sig Dispense Refill  . aspirin 81 MG EC tablet TAKE 1 TABLET BY MOUTH EVERY DAY 90 tablet 1  . diclofenac sodium (VOLTAREN) 1 % GEL Apply 4 g topically 4 (four) times daily. 1 Tube 2  . ferrous sulfate 325 (65 FE) MG tablet Take 1 tablet (325 mg total) by mouth daily. 30 tablet 3  . ibuprofen (ADVIL,MOTRIN) 200 MG tablet Take 200 mg by mouth every 6 (six) hours as needed.    . methocarbamol (ROBAXIN) 500 MG tablet Take 1 tablet (500 mg total) by mouth 2 (two) times daily as needed for muscle spasms. 60 tablet 2  . naproxen sodium (ALEVE) 220 MG tablet Take 220 mg by mouth.    . pravastatin (PRAVACHOL) 80 MG tablet Take 1 tablet (80 mg total) by mouth daily. 90 tablet 3  . topiramate (TOPAMAX) 25 MG tablet Take 1 tablet (25 mg total) by mouth 2 (two) times daily. 60 tablet 1   No current facility-administered medications on file prior to visit.     Current Status: Since her last office visit, she is doing well with no complaints. She denies visual changes, chest pain, cough, shortness of breath, heart palpitations, and falls. She has occasional headaches and dizziness with position changes. Denies severe headaches, confusion,  seizures, double vision, and blurred vision, nausea and vomiting. She continues to have chronic back pain, she has been taking Motrin for relief.   She denies fevers, chills, fatigue, recent infections, weight loss, and night sweats. No reports of GI problems such as nausea, vomiting, diarrhea, and constipation. She has no reports of blood in stools, dysuria and hematuria. No depression or anxiety, and denies suicidal ideations, homicidal ideations, or auditory hallucinations. She denies pain today.    Observations/Objective:  Telephone Virtual Visit   Assessment and Plan:  1. Back pain, unspecified back location, unspecified back pain laterality, unspecified chronicity Continue OTC pain medications as needed.   2. Essential hypertension Stable. She will continue to decrease high sodium intake, excessive alcohol intake, increase potassium intake, smoking cessation, and increase physical activity of at least 30 minutes of cardio activity daily. She will continue to follow Heart Healthy or DASH diet.  No orders of the defined types were placed in this encounter.   No orders of the defined types were placed in this encounter.   Referral Orders  No referral(s) requested today    [name],  MSN, FNP-C [facility] [address]  [place], [place] [id number] [phone]  Follow Up Instructions:  She will follow up in 6 months.    I discussed the assessment and treatment plan with the patient. The patient was provided an opportunity to ask questions and all  were answered. The patient agreed with the plan and demonstrated an understanding of the instructions.   The patient was advised to call back or seek an in-person evaluation if the symptoms worsen or if the condition fails to improve as anticipated.  I provided 15 minutes of non-face-to-face time during this encounter.   Kallie Locks, FNP

## 2018-11-19 ENCOUNTER — Encounter: Payer: Self-pay | Admitting: Family Medicine

## 2018-11-19 ENCOUNTER — Ambulatory Visit: Payer: Medicaid Other | Admitting: Family Medicine

## 2018-11-19 ENCOUNTER — Other Ambulatory Visit: Payer: Self-pay

## 2018-11-19 ENCOUNTER — Ambulatory Visit (INDEPENDENT_AMBULATORY_CARE_PROVIDER_SITE_OTHER): Payer: Medicaid Other | Admitting: Family Medicine

## 2018-11-19 VITALS — BP 104/67 | HR 96 | Temp 98.2°F | Resp 16 | Ht 64.0 in | Wt 164.0 lb

## 2018-11-19 DIAGNOSIS — R350 Frequency of micturition: Secondary | ICD-10-CM

## 2018-11-19 DIAGNOSIS — I10 Essential (primary) hypertension: Secondary | ICD-10-CM | POA: Insufficient documentation

## 2018-11-19 DIAGNOSIS — M5442 Lumbago with sciatica, left side: Secondary | ICD-10-CM | POA: Diagnosis not present

## 2018-11-19 DIAGNOSIS — Z09 Encounter for follow-up examination after completed treatment for conditions other than malignant neoplasm: Secondary | ICD-10-CM | POA: Diagnosis not present

## 2018-11-19 DIAGNOSIS — M5441 Lumbago with sciatica, right side: Secondary | ICD-10-CM

## 2018-11-19 LAB — POCT URINALYSIS DIPSTICK
Bilirubin, UA: NEGATIVE
Glucose, UA: NEGATIVE
Ketones, UA: NEGATIVE
Nitrite, UA: NEGATIVE
Protein, UA: NEGATIVE
Spec Grav, UA: 1.02 (ref 1.010–1.025)
Urobilinogen, UA: 0.2 E.U./dL
pH, UA: 6.5 (ref 5.0–8.0)

## 2018-11-19 NOTE — Patient Instructions (Signed)
Sulfamethoxazole; Trimethoprim, SMX-TMP tablets What is this medicine? SULFAMETHOXAZOLE; TRIMETHOPRIM or SMX-TMP (suhl fuh meth OK suh zohl; trye METH oh prim) is a combination of a sulfonamide antibiotic and a second antibiotic, trimethoprim. It is used to treat or prevent certain kinds of bacterial infections. It will not work for colds, flu, or other viral infections. This medicine may be used for other purposes; ask your health care provider or pharmacist if you have questions. COMMON BRAND NAME(S): Bacter-Aid DS, Bactrim, Bactrim DS, Septra, Septra DS What should I tell my health care provider before I take this medicine? They need to know if you have any of these conditions: -anemia -asthma -being treated with anticonvulsants -if you frequently drink alcohol containing drinks -kidney disease -liver disease -low level of folic acid or glucose-6-phosphate dehydrogenase -poor nutrition or malabsorption -porphyria -severe allergies -thyroid disorder -an unusual or allergic reaction to sulfamethoxazole, trimethoprim, sulfa drugs, other medicines, foods, dyes, or preservatives -pregnant or trying to get pregnant -breast-feeding How should I use this medicine? Take this medicine by mouth with a full glass of water. Follow the directions on the prescription label. Take your medicine at regular intervals. Do not take it more often than directed. Do not skip doses or stop your medicine early. Talk to your pediatrician regarding the use of this medicine in children. Special care may be needed. This medicine has been used in children as young as 2 months of age. Overdosage: If you think you have taken too much of this medicine contact a poison control center or emergency room at once. NOTE: This medicine is only for you. Do not share this medicine with others. What if I miss a dose? If you miss a dose, take it as soon as you can. If it is almost time for your next dose, take only that dose. Do  not take double or extra doses. What may interact with this medicine? Do not take this medicine with any of the following medications: -aminobenzoate potassium -dofetilide -metronidazole This medicine may also interact with the following medications: -ACE inhibitors like benazepril, enalapril, lisinopril, and ramipril -birth control pills -cyclosporine -digoxin -diuretics -indomethacin -medicines for diabetes -methenamine -methotrexate -phenytoin -potassium supplements -pyrimethamine -sulfinpyrazone -tricyclic antidepressants -warfarin This list may not describe all possible interactions. Give your health care provider a list of all the medicines, herbs, non-prescription drugs, or dietary supplements you use. Also tell them if you smoke, drink alcohol, or use illegal drugs. Some items may interact with your medicine. What should I watch for while using this medicine? Tell your doctor or health care professional if your symptoms do not improve. Drink several glasses of water a day to reduce the risk of kidney problems. Do not treat diarrhea with over the counter products. Contact your doctor if you have diarrhea that lasts more than 2 days or if it is severe and watery. This medicine can make you more sensitive to the sun. Keep out of the sun. If you cannot avoid being in the sun, wear protective clothing and use a sunscreen. Do not use sun lamps or tanning beds/booths. What side effects may I notice from receiving this medicine? Side effects that you should report to your doctor or health care professional as soon as possible: -allergic reactions like skin rash or hives, swelling of the face, lips, or tongue -breathing problems -fever or chills, sore throat -irregular heartbeat, chest pain -joint or muscle pain -pain or difficulty passing urine -red pinpoint spots on skin -redness, blistering, peeling or loosening of   the skin, including inside the mouth -unusual bleeding or  bruising -unusually weak or tired -yellowing of the eyes or skin Side effects that usually do not require medical attention (report to your doctor or health care professional if they continue or are bothersome): -diarrhea -dizziness -headache -loss of appetite -nausea, vomiting -nervousness This list may not describe all possible side effects. Call your doctor for medical advice about side effects. You may report side effects to FDA at 1-800-FDA-1088. Where should I keep my medicine? Keep out of the reach of children. Store at room temperature between 20 to 25 degrees C (68 to 77 degrees F). Protect from light. Throw away any unused medicine after the expiration date. NOTE: This sheet is a summary. It may not cover all possible information. If you have questions about this medicine, talk to your doctor, pharmacist, or health care provider.  2019 Elsevier/Gold Standard (2013-01-04 14:38:26) Urinary Tract Infection, Adult A urinary tract infection (UTI) is an infection of any part of the urinary tract. The urinary tract includes:  The kidneys.  The ureters.  The bladder.  The urethra. These organs make, store, and get rid of pee (urine) in the body. What are the causes? This is caused by germs (bacteria) in your genital area. These germs grow and cause swelling (inflammation) of your urinary tract. What increases the risk? You are more likely to develop this condition if:  You have a small, thin tube (catheter) to drain pee.  You cannot control when you pee or poop (incontinence).  You are female, and: ? You use these methods to prevent pregnancy: ? A medicine that kills sperm (spermicide). ? A device that blocks sperm (diaphragm). ? You have low levels of a female hormone (estrogen). ? You are pregnant.  You have genes that add to your risk.  You are sexually active.  You take antibiotic medicines.  You have trouble peeing because of: ? A prostate that is bigger than  normal, if you are female. ? A blockage in the part of your body that drains pee from the bladder (urethra). ? A kidney stone. ? A nerve condition that affects your bladder (neurogenic bladder). ? Not getting enough to drink. ? Not peeing often enough.  You have other conditions, such as: ? Diabetes. ? A weak disease-fighting system (immune system). ? Sickle cell disease. ? Gout. ? Injury of the spine. What are the signs or symptoms? Symptoms of this condition include:  Needing to pee right away (urgently).  Peeing often.  Peeing small amounts often.  Pain or burning when peeing.  Blood in the pee.  Pee that smells bad or not like normal.  Trouble peeing.  Pee that is cloudy.  Fluid coming from the vagina, if you are female.  Pain in the belly or lower back. Other symptoms include:  Throwing up (vomiting).  No urge to eat.  Feeling mixed up (confused).  Being tired and grouchy (irritable).  A fever.  Watery poop (diarrhea). How is this treated? This condition may be treated with:  Antibiotic medicine.  Other medicines.  Drinking enough water. Follow these instructions at home:  Medicines  Take over-the-counter and prescription medicines only as told by your doctor.  If you were prescribed an antibiotic medicine, take it as told by your doctor. Do not stop taking it even if you start to feel better. General instructions  Make sure you: ? Pee until your bladder is empty. ? Do not hold pee for a long time. ?   Empty your bladder after sex. ? Wipe from front to back after pooping if you are a female. Use each tissue one time when you wipe.  Drink enough fluid to keep your pee pale yellow.  Keep all follow-up visits as told by your doctor. This is important. Contact a doctor if:  You do not get better after 1-2 days.  Your symptoms go away and then come back. Get help right away if:  You have very bad back pain.  You have very bad pain in  your lower belly.  You have a fever.  You are sick to your stomach (nauseous).  You are throwing up. Summary  A urinary tract infection (UTI) is an infection of any part of the urinary tract.  This condition is caused by germs in your genital area.  There are many risk factors for a UTI. These include having a small, thin tube to drain pee and not being able to control when you pee or poop.  Treatment includes antibiotic medicines for germs.  Drink enough fluid to keep your pee pale yellow. This information is not intended to replace advice given to you by your health care provider. Make sure you discuss any questions you have with your health care provider. Document Released: 11/16/2007 Document Revised: 12/07/2017 Document Reviewed: 12/07/2017 Elsevier Interactive Patient Education  2019 Elsevier Inc.  

## 2018-11-19 NOTE — Progress Notes (Signed)
[facility]   Established Patient Office Visit  Subjective:  Patient ID: Andrea Stone, female    DOB: 01/25/1981  Age: 38 y.o. MRN: [id number]  CC:  Chief Complaint  Patient presents with  . Urinary Frequency    HPI Andrea Stone is a 38 year old female who presents for Follow Up today.   Past Medical History:  Diagnosis Date  . Hyperlipidemia   . Hypertension   . Trichomoniasis   . UTI (urinary tract infection) during pregnancy    Current Status: Since her last office visit, she is doing well with no complaints. She has c/o urinary frequently X 2 weeks. She has not taken any medication for relief of symptoms. She denies discharge, dysuria, urinary itching, burning, odor, hematuria, and suprapubic pain/discomfort. She denies visual changes, chest pain, cough, shortness of breath, heart palpitations, and falls. She has occasional headaches and dizziness with position changes. Denies severe headaches, confusion, seizures, double vision, and blurred vision, nausea and vomiting.  She denies fevers, chills, fatigue, recent infections, weight loss, and night sweats. No reports of GI problems such as diarrhea, and constipation. She has no reports of blood in stools. No depression or anxiety reported. She denies pain today.   Past Surgical History:  Procedure Laterality Date  . CARDIOPULMONARY EXERCISE TEST  07/2017    No regional WMA. No valvular lesions. cardiopulmonary limitation seen. Peak VO2 normal for workload achieved on submaximal test. Dyspnea most likely related to deconditioning & limitations due to weight. Normal Study from a Heart & Lung standpoint.  Marland Kitchen LAPAROSCOPIC TUBAL LIGATION Bilateral 02/06/2013   Procedure: LAPAROSCOPIC TUBAL LIGATION;  Surgeon: Frederico Hamman, MD;  Location: [facility];  Service: Gynecology;  Laterality: Bilateral;  . TRANSTHORACIC ECHOCARDIOGRAM  07/2017   EF 60-65%.  No regional WMA. No valvular lesions.   . WISDOM TOOTH EXTRACTION      Family History  Problem Relation Age of Onset  . Kidney disease Maternal Grandmother   . Heart failure Maternal Grandmother   . Hypertension Mother   . Asthma Sister   . Coronary artery disease Father        CABG - in 71s.  . Anesthesia problems Neg Hx   . Other Neg Hx     Social History   Socioeconomic History  . Marital status: Single    Spouse name: Not on file  . Number of children: 3  . Years of education: Not on file  . Highest education level: High school graduate  Occupational History  . Not on file  Social Needs  . Financial resource strain: Not on file  . Food insecurity:    Worry: Not on file    Inability: Not on file  . Transportation needs:    Medical: Not on file    Non-medical: Not on file  Tobacco Use  . Smoking status: Current Some Day Smoker    Packs/day: 0.10    Years: 13.00    Pack years: 1.30    Types: Cigarettes    Last attempt to quit: 05/27/2017    Years since quitting: 1.4  . Smokeless tobacco: Never Used  Substance and Sexual Activity  . Alcohol use: Yes    Comment: socially  . Drug use: No  . Sexual activity: Yes    Birth control/protection: None  Lifestyle  . Physical activity:    Days per week: Not on file    Minutes per session: Not on file  .  Stress: Not on file  Relationships  . Social connections:    Talks on phone: Not on file    Gets together: Not on file    Attends religious service: Not on file    Active member of club or organization: Not on file    Attends meetings of clubs or organizations: Not on file    Relationship status: Not on file  . Intimate partner violence:    Fear of current or ex partner: Not on file    Emotionally abused: Not on file    Physically abused: Not on file    Forced sexual activity: Not on file  Other Topics Concern  . Not on file  Social History Narrative   Disabled - (since childhood).    Outpatient Medications Prior to Visit  Medication Sig  Dispense Refill  . ferrous sulfate 325 (65 FE) MG tablet Take 1 tablet (325 mg total) by mouth daily. 30 tablet 3  . ibuprofen (ADVIL,MOTRIN) 200 MG tablet Take 200 mg by mouth every 6 (six) hours as needed.    . methocarbamol (ROBAXIN) 500 MG tablet Take 1 tablet (500 mg total) by mouth 2 (two) times daily as needed for muscle spasms. 60 tablet 2  . pravastatin (PRAVACHOL) 80 MG tablet Take 1 tablet (80 mg total) by mouth daily. 90 tablet 3  . aspirin 81 MG EC tablet TAKE 1 TABLET BY MOUTH EVERY DAY (Patient not taking: Reported on 11/19/2018) 90 tablet 1  . diclofenac sodium (VOLTAREN) 1 % GEL Apply 4 g topically 4 (four) times daily. (Patient not taking: Reported on 11/19/2018) 1 Tube 2  . topiramate (TOPAMAX) 25 MG tablet Take 1 tablet (25 mg total) by mouth 2 (two) times daily. (Patient not taking: Reported on 11/19/2018) 60 tablet 1  . naproxen sodium (ALEVE) 220 MG tablet Take 220 mg by mouth.     No facility-administered medications prior to visit.     Allergies  Allergen Reactions  . Zantac [Ranitidine Hcl] Swelling and Rash    ROS Review of Systems  Constitutional: Negative.   HENT: Negative.   Eyes: Negative.   Respiratory: Negative.   Cardiovascular: Negative.   Gastrointestinal: Positive for abdominal distention (obese).  Endocrine: Negative.   Genitourinary: Negative.   Musculoskeletal: Negative.   Skin: Negative.   Allergic/Immunologic: Negative.   Neurological: Negative.   Hematological: Negative.   Psychiatric/Behavioral: Negative.       Objective:    Physical Exam  Constitutional: She is oriented to person, place, and time. She appears well-developed and well-nourished.  HENT:  Head: Normocephalic and atraumatic.  Eyes: Conjunctivae are normal.  Neck: Normal range of motion. Neck supple.  Cardiovascular: Normal rate, regular rhythm, normal heart sounds and intact distal pulses.  Pulmonary/Chest: Effort normal and breath sounds normal.  Abdominal: Soft. Bowel  sounds are normal.  Musculoskeletal: Normal range of motion.  Neurological: She is alert and oriented to person, place, and time. She has normal reflexes.  Skin: Skin is warm and dry.  Psychiatric: She has a normal mood and affect. Her behavior is normal. Thought content normal.  Nursing note and vitals reviewed.  BP 104/67 (BP Location: Right Arm, Patient Position: Sitting, Cuff Size: Large)   Pulse 96   Temp 98.2 F (36.8 C) (Oral)   Resp 16   Ht 5\' 4"  (1.626 m)   Wt 164 lb (74.4 kg)   LMP 10/25/2018   SpO2 100%   BMI 28.15 kg/m  Wt Readings from Last 3 Encounters:  11/19/18 164 lb (74.4 kg)  08/31/18 160 lb (72.6 kg)  07/18/18 159 lb (72.1 kg)     There are no preventive care reminders to display for this patient.  There are no preventive care reminders to display for this patient.  Lab Results  Component Value Date   TSH 0.671 01/10/2018   Lab Results  Component Value Date   WBC 7.6 03/28/2018   HGB 9.8 (L) 03/28/2018   HCT 31.2 (L) 03/28/2018   MCV 78 (L) 03/28/2018   PLT 405 03/28/2018   Lab Results  Component Value Date   NA 137 01/10/2018   K 4.2 01/10/2018   CO2 22 01/10/2018   GLUCOSE 84 01/10/2018   BUN 11 01/10/2018   CREATININE 0.88 01/10/2018   BILITOT 0.3 01/10/2018   ALKPHOS 59 01/10/2018   AST 15 01/10/2018   ALT 16 01/10/2018   PROT 7.0 01/10/2018   ALBUMIN 4.1 01/10/2018   CALCIUM 9.2 01/10/2018   ANIONGAP 11 07/16/2017   Lab Results  Component Value Date   CHOL 207 (H) 01/10/2018   Lab Results  Component Value Date   HDL 38 (L) 01/10/2018   Lab Results  Component Value Date   LDLCALC 152 (H) 01/10/2018   Lab Results  Component Value Date   TRIG 85 01/10/2018   Lab Results  Component Value Date   CHOLHDL 5.4 (H) 01/10/2018   Lab Results  Component Value Date   HGBA1C 5.6 01/10/2018    Assessment & Plan:   1. Urinary frequency Possibly r/t UTI. We will continue to monitor.  - Urinalysis Dipstick  2. Low back  pain with bilateral sciatica, unspecified back pain laterality, unspecified chronicity - Ambulatory referral to Physical Therapy  3. Essential hypertension The current medical regimen is effective; blood pressure is stable at 104/67 today; continue present plan and medications as prescribed. She will continue to decrease high sodium intake, excessive alcohol intake, increase potassium intake, smoking cessation, and increase physical activity of at least 30 minutes of cardio activity daily. She will continue to follow Heart Healthy or DASH diet.  4. Follow up She will keep follow up appointment for 04/2019  No orders of the defined types were placed in this encounter.   Orders Placed This Encounter  Procedures  . Ambulatory referral to Physical Therapy  . Urinalysis Dipstick     Referral Orders     Ambulatory referral to Physical Therapy   Raliegh IpNatalie Lily Velasquez,  MSN, FNP-BC [facility] [address]  [place], [place] [id number] [phone]   Problem List Items Addressed This Visit      Nervous and Auditory   Low back pain with bilateral sciatica   Relevant Orders   Ambulatory referral to Physical Therapy    Other Visit Diagnoses    Urinary frequency    -  Primary   Relevant Orders   Urinalysis Dipstick (Completed)   Essential hypertension       Follow up          No orders of the defined types were placed in this encounter.   Follow-up: Return in about 6 months (around 05/21/2019).    Kallie LocksNatalie M Deissy Guilbert, FNP

## 2018-11-20 ENCOUNTER — Other Ambulatory Visit: Payer: Self-pay | Admitting: Family Medicine

## 2018-11-20 ENCOUNTER — Telehealth: Payer: Self-pay

## 2018-11-20 DIAGNOSIS — R319 Hematuria, unspecified: Secondary | ICD-10-CM

## 2018-11-20 DIAGNOSIS — M549 Dorsalgia, unspecified: Secondary | ICD-10-CM

## 2018-11-20 DIAGNOSIS — N390 Urinary tract infection, site not specified: Secondary | ICD-10-CM

## 2018-11-20 MED ORDER — SULFAMETHOXAZOLE-TRIMETHOPRIM 800-160 MG PO TABS: 1.0000 | ORAL_TABLET | Freq: Two times a day (BID) | ORAL | 0 refills | Status: DC

## 2018-11-21 ENCOUNTER — Telehealth: Payer: Self-pay | Admitting: Family Medicine

## 2018-11-21 NOTE — Telephone Encounter (Signed)
Patient notified that medication was sent to pharmacy.

## 2018-11-21 NOTE — Telephone Encounter (Signed)
Left a detail vm letting patient know that medication has been sent

## 2018-12-04 ENCOUNTER — Ambulatory Visit: Payer: Medicaid Other | Admitting: Physical Therapy

## 2018-12-13 ENCOUNTER — Ambulatory Visit: Payer: Medicaid Other | Attending: Family Medicine | Admitting: Physical Therapy

## 2018-12-24 ENCOUNTER — Other Ambulatory Visit: Payer: Self-pay | Admitting: Family Medicine

## 2018-12-24 DIAGNOSIS — M549 Dorsalgia, unspecified: Secondary | ICD-10-CM

## 2018-12-24 DIAGNOSIS — B955 Unspecified streptococcus as the cause of diseases classified elsewhere: Secondary | ICD-10-CM

## 2018-12-26 ENCOUNTER — Other Ambulatory Visit: Payer: Self-pay

## 2018-12-26 ENCOUNTER — Telehealth: Payer: Self-pay

## 2018-12-26 DIAGNOSIS — M549 Dorsalgia, unspecified: Secondary | ICD-10-CM

## 2018-12-26 MED ORDER — METHOCARBAMOL 500 MG PO TABS: 500.0000 mg | ORAL_TABLET | Freq: Two times a day (BID) | ORAL | 2 refills | Status: DC | PRN

## 2018-12-26 NOTE — Telephone Encounter (Signed)
-----   Message from Azzie Glatter, FNP sent at 12/26/2018  8:34 AM EDT ----- Regarding: "Refill" Received refill request for Robaxin and Amoxicillin for this patient. Okay to refill Robaxin only. Patient to contact office if she continues to experience UTI symptoms. Thank you.

## 2018-12-26 NOTE — Telephone Encounter (Signed)
Medication sent to pharmacy  

## 2019-01-23 ENCOUNTER — Telehealth: Payer: Self-pay

## 2019-01-24 ENCOUNTER — Other Ambulatory Visit: Payer: Self-pay

## 2019-01-24 DIAGNOSIS — D509 Iron deficiency anemia, unspecified: Secondary | ICD-10-CM

## 2019-01-24 MED ORDER — FERROUS SULFATE 325 (65 FE) MG PO TABS: 325.0000 mg | ORAL_TABLET | Freq: Every day | ORAL | 3 refills | Status: DC

## 2019-01-24 NOTE — Telephone Encounter (Signed)
Patient notified that medication is sent to pharmacy

## 2019-02-12 DIAGNOSIS — E559 Vitamin D deficiency, unspecified: Secondary | ICD-10-CM

## 2019-02-12 HISTORY — DX: Vitamin D deficiency, unspecified: E55.9

## 2019-03-11 ENCOUNTER — Ambulatory Visit (INDEPENDENT_AMBULATORY_CARE_PROVIDER_SITE_OTHER): Payer: Medicaid Other | Admitting: Family Medicine

## 2019-03-11 ENCOUNTER — Other Ambulatory Visit: Payer: Self-pay

## 2019-03-11 VITALS — BP 119/70 | HR 62 | Temp 98.1°F | Ht 64.0 in | Wt 161.8 lb

## 2019-03-11 DIAGNOSIS — G629 Polyneuropathy, unspecified: Secondary | ICD-10-CM

## 2019-03-11 DIAGNOSIS — Z23 Encounter for immunization: Secondary | ICD-10-CM | POA: Diagnosis not present

## 2019-03-11 DIAGNOSIS — M549 Dorsalgia, unspecified: Secondary | ICD-10-CM | POA: Diagnosis not present

## 2019-03-11 DIAGNOSIS — R5383 Other fatigue: Secondary | ICD-10-CM

## 2019-03-11 DIAGNOSIS — M5441 Lumbago with sciatica, right side: Secondary | ICD-10-CM

## 2019-03-11 DIAGNOSIS — M5442 Lumbago with sciatica, left side: Secondary | ICD-10-CM | POA: Diagnosis not present

## 2019-03-11 DIAGNOSIS — N390 Urinary tract infection, site not specified: Secondary | ICD-10-CM

## 2019-03-11 DIAGNOSIS — Z09 Encounter for follow-up examination after completed treatment for conditions other than malignant neoplasm: Secondary | ICD-10-CM

## 2019-03-11 DIAGNOSIS — D649 Anemia, unspecified: Secondary | ICD-10-CM

## 2019-03-11 DIAGNOSIS — D509 Iron deficiency anemia, unspecified: Secondary | ICD-10-CM

## 2019-03-11 DIAGNOSIS — I10 Essential (primary) hypertension: Secondary | ICD-10-CM | POA: Diagnosis not present

## 2019-03-11 MED ORDER — GABAPENTIN 300 MG PO CAPS: 300.0000 mg | ORAL_CAPSULE | Freq: Two times a day (BID) | ORAL | 3 refills | Status: DC

## 2019-03-11 NOTE — Progress Notes (Signed)
[name]   Established Patient Office Visit  Subjective:  Patient ID: Andrea Stone, female    DOB: 05-14-1981  Age: 38 y.o. MRN: [id number]  CC:  Chief Complaint  Patient presents with  . Leg Pain    pain in both leg , taking muslce relaxer for pain  started on last friday     HPI Andrea Stone is a 38 year old female who presents for Follow Up today.   Past Medical History:  Diagnosis Date  . Hyperlipidemia   . Hypertension   . Trichomoniasis   . UTI (urinary tract infection) during pregnancy    Current Status: Since her last office visit, she has c/o back and leg pain. She has not began to take Diclofenac Gel. She has been taking Robaxin and Motrin for aide in back pain relief. She states that she has been having increasing pain radiating to her legs lately. She states that her pain is 9/10. She will follow up PT when she can get a babysitter. She denies visual changes, chest pain, cough, shortness of breath, heart palpitations, and falls. She has occasional headaches and dizziness with position changes. Denies severe headaches, confusion, seizures, double vision, and blurred vision, nausea and vomiting. Her anxiety is mild today. She denies suicidal ideations, homicidal ideations, or auditory hallucinations.  She denies fevers, chills, fatigue, recent infections, weight loss, and night sweats.  No reports of GI problems such as diarrhea, and constipation. She has no reports of blood in stools, dysuria and hematuria.   Past Surgical History:  Procedure Laterality Date  . CARDIOPULMONARY EXERCISE TEST  07/2017    No regional WMA. No valvular lesions. cardiopulmonary limitation seen. Peak VO2 normal for workload achieved on submaximal test. Dyspnea most likely related to deconditioning & limitations due to weight. Normal Study from a Heart & Lung standpoint.  Marland Kitchen LAPAROSCOPIC TUBAL LIGATION Bilateral 02/06/2013   Procedure:  LAPAROSCOPIC TUBAL LIGATION;  Surgeon: Frederico Hamman, MD;  Location: [facility];  Service: Gynecology;  Laterality: Bilateral;  . TRANSTHORACIC ECHOCARDIOGRAM  07/2017   EF 60-65%.  No regional WMA. No valvular lesions.  . WISDOM TOOTH EXTRACTION      Family History  Problem Relation Age of Onset  . Kidney disease Maternal Grandmother   . Heart failure Maternal Grandmother   . Hypertension Mother   . Asthma Sister   . Coronary artery disease Father        CABG - in 77s.  . Anesthesia problems Neg Hx   . Other Neg Hx     Social History   Socioeconomic History  . Marital status: Single    Spouse name: Not on file  . Number of children: 3  . Years of education: Not on file  . Highest education level: High school graduate  Occupational History  . Not on file  Social Needs  . Financial resource strain: Not on file  . Food insecurity    Worry: Not on file    Inability: Not on file  . Transportation needs    Medical: Not on file    Non-medical: Not on file  Tobacco Use  . Smoking status: Current Some Day Smoker    Packs/day: 0.10    Years: 13.00    Pack years: 1.30    Types: Cigarettes    Last attempt to quit: 05/27/2017    Years since quitting: 1.7  . Smokeless tobacco: Never Used  Substance and Sexual Activity  .  Alcohol use: Yes    Comment: socially  . Drug use: No  . Sexual activity: Yes    Birth control/protection: None  Lifestyle  . Physical activity    Days per week: Not on file    Minutes per session: Not on file  . Stress: Not on file  Relationships  . Social Musicianconnections    Talks on phone: Not on file    Gets together: Not on file    Attends religious service: Not on file    Active member of club or organization: Not on file    Attends meetings of clubs or organizations: Not on file    Relationship status: Not on file  . Intimate partner violence    Fear of current or ex partner: Not on file    Emotionally abused: Not on file    Physically abused:  Not on file    Forced sexual activity: Not on file  Other Topics Concern  . Not on file  Social History Narrative   Disabled - (since childhood).    Outpatient Medications Prior to Visit  Medication Sig Dispense Refill  . ferrous sulfate 325 (65 FE) MG tablet Take 1 tablet (325 mg total) by mouth daily. 30 tablet 3  . ibuprofen (ADVIL,MOTRIN) 200 MG tablet Take 200 mg by mouth every 6 (six) hours as needed.    . methocarbamol (ROBAXIN) 500 MG tablet Take 1 tablet (500 mg total) by mouth 2 (two) times daily as needed for muscle spasms. 60 tablet 2  . pravastatin (PRAVACHOL) 80 MG tablet Take 1 tablet (80 mg total) by mouth daily. 90 tablet 3  . aspirin 81 MG EC tablet TAKE 1 TABLET BY MOUTH EVERY DAY (Patient not taking: Reported on 11/19/2018) 90 tablet 1  . diclofenac sodium (VOLTAREN) 1 % GEL Apply 4 g topically 4 (four) times daily. (Patient not taking: Reported on 11/19/2018) 1 Tube 2  . topiramate (TOPAMAX) 25 MG tablet Take 1 tablet (25 mg total) by mouth 2 (two) times daily. (Patient not taking: Reported on 11/19/2018) 60 tablet 1  . sulfamethoxazole-trimethoprim (BACTRIM DS) 800-160 MG tablet Take 1 tablet by mouth 2 (two) times daily. (Patient not taking: Reported on 03/11/2019) 14 tablet 0   No facility-administered medications prior to visit.     Allergies  Allergen Reactions  . Zantac [Ranitidine Hcl] Swelling and Rash    ROS Review of Systems    Objective:    Physical Exam  BP 119/70 (BP Location: Left Arm, Patient Position: Sitting, Cuff Size: Normal)   Pulse 62   Temp 98.1 F (36.7 C) (Oral)   Ht 5\' 4"  (1.626 m)   Wt 161 lb 12.8 oz (73.4 kg)   SpO2 99%   BMI 27.77 kg/m  Wt Readings from Last 3 Encounters:  03/11/19 161 lb 12.8 oz (73.4 kg)  11/19/18 164 lb (74.4 kg)  08/31/18 160 lb (72.6 kg)     Health Maintenance Due  Topic Date Due  . INFLUENZA VACCINE  01/12/2019    There are no preventive care reminders to display for this patient.  Lab Results   Component Value Date   TSH 0.671 01/10/2018   Lab Results  Component Value Date   WBC 7.6 03/28/2018   HGB 9.8 (L) 03/28/2018   HCT 31.2 (L) 03/28/2018   MCV 78 (L) 03/28/2018   PLT 405 03/28/2018   Lab Results  Component Value Date   NA 137 01/10/2018   K 4.2 01/10/2018   CO2  22 01/10/2018   GLUCOSE 84 01/10/2018   BUN 11 01/10/2018   CREATININE 0.88 01/10/2018   BILITOT 0.3 01/10/2018   ALKPHOS 59 01/10/2018   AST 15 01/10/2018   ALT 16 01/10/2018   PROT 7.0 01/10/2018   ALBUMIN 4.1 01/10/2018   CALCIUM 9.2 01/10/2018   ANIONGAP 11 07/16/2017   Lab Results  Component Value Date   CHOL 207 (H) 01/10/2018   Lab Results  Component Value Date   HDL 38 (L) 01/10/2018   Lab Results  Component Value Date   LDLCALC 152 (H) 01/10/2018   Lab Results  Component Value Date   TRIG 85 01/10/2018   Lab Results  Component Value Date   CHOLHDL 5.4 (H) 01/10/2018   Lab Results  Component Value Date   HGBA1C 5.6 01/10/2018      Assessment & Plan:   1. Back pain, unspecified back location, unspecified back pain laterality, unspecified chronicity Stable.  2. Low back pain with bilateral sciatica, unspecified back pain laterality, unspecified chronicity  3. Essential hypertension The current medical regimen is effective; blood pressure is stable at 119/70 today; continue present plan and medications as prescribed. She will continue to decrease high sodium intake, excessive alcohol intake, increase potassium intake, smoking cessation, and increase physical activity of at least 30 minutes of cardio activity daily. She will continue to follow Heart Healthy or DASH diet. - CBC with Differential - Iron and TIBC([facility]/Sun[facility]) - Comprehensive metabolic panel - Lipid Panel - TSH - Vitamin B12 - Vitamin D, 25-hydroxy  4. Anemia She will continue Ferrous Sulfate as prescribed.   5. Iron deficiency anemia, unspecified iron deficiency anemia type Most recent Hgb was  9.8, and Iron Sat/Iron were decreased at 5/22 on 03/29/2019. We will re-assess Iron Studies today.   6. Fatigue Increased fatigue lately.  7. Neuropathy We will initiate Gabapentin today.  - gabapentin (NEURONTIN) 300 MG capsule; Take 1 capsule (300 mg total) by mouth 2 (two) times daily.  Dispense: 60 capsule; Refill: 3  8. Need for influenza vaccination - Flu Vaccine QUAD 6+ mos PF IM (Fluarix Quad PF)  9. Urinary tract infection without hematuria, site unspecified Orders are pending.  - Urine Culture  10. Follow up She will follow up in 3 months.   Meds ordered this encounter  Medications  . gabapentin (NEURONTIN) 300 MG capsule    Sig: Take 1 capsule (300 mg total) by mouth 2 (two) times daily.    Dispense:  60 capsule    Refill:  3    Orders Placed This Encounter  Procedures  . Urine Culture  . Flu Vaccine QUAD 6+ mos PF IM (Fluarix Quad PF)  . CBC with Differential  . Iron and TIBC([facility]/Sunquest)  . Comprehensive metabolic panel  . Lipid Panel  . TSH  . Vitamin B12  . Vitamin D, 25-hydroxy    Referral Orders  No referral(s) requested today    Raliegh Ip,  MSN, FNP-BC [facility] [address] [place], [place] [address] [phone] fax  Problem List Items Addressed This Visit      Cardiovascular and Mediastinum   Essential hypertension   Relevant Orders   CBC with Differential   Iron and TIBC([facility]/Sunquest)   Comprehensive metabolic panel   Lipid Panel   TSH   Vitamin B12   Vitamin D, 25-hydroxy     Nervous and Auditory   Low back pain with bilateral sciatica  Relevant Medications   gabapentin (NEURONTIN) 300 MG capsule    Other Visit Diagnoses    Back pain, unspecified back location, unspecified back pain laterality, unspecified chronicity    -  Primary   Iron deficiency anemia, unspecified iron deficiency anemia type       Neuropathy        Relevant Medications   gabapentin (NEURONTIN) 300 MG capsule   Need for influenza vaccination       Relevant Orders   Flu Vaccine QUAD 6+ mos PF IM (Fluarix Quad PF)   Urinary tract infection without hematuria, site unspecified       Relevant Orders   Urine Culture   Follow up          Meds ordered this encounter  Medications  . gabapentin (NEURONTIN) 300 MG capsule    Sig: Take 1 capsule (300 mg total) by mouth 2 (two) times daily.    Dispense:  60 capsule    Refill:  3    Follow-up: Return in about 3 months (around 06/10/2019).    Kallie Locks, FNP

## 2019-03-12 ENCOUNTER — Encounter: Payer: Self-pay | Admitting: Family Medicine

## 2019-03-12 DIAGNOSIS — G629 Polyneuropathy, unspecified: Secondary | ICD-10-CM | POA: Insufficient documentation

## 2019-03-12 DIAGNOSIS — D649 Anemia, unspecified: Secondary | ICD-10-CM | POA: Insufficient documentation

## 2019-03-12 DIAGNOSIS — D509 Iron deficiency anemia, unspecified: Secondary | ICD-10-CM | POA: Insufficient documentation

## 2019-03-12 DIAGNOSIS — R5383 Other fatigue: Secondary | ICD-10-CM | POA: Insufficient documentation

## 2019-03-12 LAB — COMPREHENSIVE METABOLIC PANEL
ALT: 36 IU/L — ABNORMAL HIGH (ref 0–32)
AST: 27 IU/L (ref 0–40)
Albumin/Globulin Ratio: 1.4 (ref 1.2–2.2)
Albumin: 4.1 g/dL (ref 3.8–4.8)
Alkaline Phosphatase: 86 IU/L (ref 39–117)
BUN/Creatinine Ratio: 9 (ref 9–23)
BUN: 8 mg/dL (ref 6–20)
Bilirubin Total: 0.3 mg/dL (ref 0.0–1.2)
CO2: 22 mmol/L (ref 20–29)
Calcium: 9.4 mg/dL (ref 8.7–10.2)
Chloride: 102 mmol/L (ref 96–106)
Creatinine, Ser: 0.93 mg/dL (ref 0.57–1.00)
GFR calc Af Amer: 91 mL/min/{1.73_m2} (ref >59)
GFR calc non Af Amer: 79 mL/min/{1.73_m2} (ref >59)
Globulin, Total: 2.9 g/dL (ref 1.5–4.5)
Glucose: 89 mg/dL (ref 65–99)
Potassium: 4.3 mmol/L (ref 3.5–5.2)
Sodium: 137 mmol/L (ref 134–144)
Total Protein: 7 g/dL (ref 6.0–8.5)

## 2019-03-12 LAB — CBC WITH DIFFERENTIAL/PLATELET
Basophils Absolute: 0 10*3/uL (ref 0.0–0.2)
Basos: 0 %
EOS (ABSOLUTE): 0.1 10*3/uL (ref 0.0–0.4)
Eos: 2 %
Hematocrit: 38.3 % (ref 34.0–46.6)
Hemoglobin: 12.7 g/dL (ref 11.1–15.9)
Immature Grans (Abs): 0 10*3/uL (ref 0.0–0.1)
Immature Granulocytes: 0 %
Lymphocytes Absolute: 2.5 10*3/uL (ref 0.7–3.1)
Lymphs: 42 %
MCH: 29.8 pg (ref 26.6–33.0)
MCHC: 33.2 g/dL (ref 31.5–35.7)
MCV: 90 fL (ref 79–97)
Monocytes Absolute: 0.6 10*3/uL (ref 0.1–0.9)
Monocytes: 11 %
Neutrophils Absolute: 2.6 10*3/uL (ref 1.4–7.0)
Neutrophils: 45 %
Platelets: 338 10*3/uL (ref 150–450)
RBC: 4.26 x10E6/uL (ref 3.77–5.28)
RDW: 12.7 % (ref 11.7–15.4)
WBC: 5.8 10*3/uL (ref 3.4–10.8)

## 2019-03-12 LAB — IRON AND TIBC
Iron Saturation: 20 % (ref 15–55)
Iron: 76 ug/dL (ref 27–159)
Total Iron Binding Capacity: 386 ug/dL (ref 250–450)
UIBC: 310 ug/dL (ref 131–425)

## 2019-03-12 LAB — VITAMIN D 25 HYDROXY (VIT D DEFICIENCY, FRACTURES): Vit D, 25-Hydroxy: 23.9 ng/mL — ABNORMAL LOW (ref 30.0–100.0)

## 2019-03-12 LAB — LIPID PANEL
Chol/HDL Ratio: 4.8 ratio — ABNORMAL HIGH (ref 0.0–4.4)
Cholesterol, Total: 205 mg/dL — ABNORMAL HIGH (ref 100–199)
HDL: 43 mg/dL (ref >39)
LDL Chol Calc (NIH): 147 mg/dL — ABNORMAL HIGH (ref 0–99)
Triglycerides: 81 mg/dL (ref 0–149)
VLDL Cholesterol Cal: 15 mg/dL (ref 5–40)

## 2019-03-12 LAB — VITAMIN B12: Vitamin B-12: 839 pg/mL (ref 232–1245)

## 2019-03-12 LAB — TSH: TSH: 0.613 u[IU]/mL (ref 0.450–4.500)

## 2019-03-13 LAB — URINE CULTURE

## 2019-03-18 ENCOUNTER — Other Ambulatory Visit (HOSPITAL_COMMUNITY): Payer: Self-pay

## 2019-03-18 ENCOUNTER — Other Ambulatory Visit: Payer: Self-pay | Admitting: Family Medicine

## 2019-03-18 ENCOUNTER — Encounter: Payer: Self-pay | Admitting: Family Medicine

## 2019-03-18 DIAGNOSIS — E559 Vitamin D deficiency, unspecified: Secondary | ICD-10-CM

## 2019-03-18 DIAGNOSIS — D509 Iron deficiency anemia, unspecified: Secondary | ICD-10-CM

## 2019-03-18 MED ORDER — VITAMIN D (ERGOCALCIFEROL) 1.25 MG (50000 UNIT) PO CAPS: 50000.0000 [IU] | ORAL_CAPSULE | ORAL | 3 refills | Status: DC

## 2019-03-18 MED ORDER — FERROUS SULFATE 325 (65 FE) MG PO TABS: 325.0000 mg | ORAL_TABLET | Freq: Every day | ORAL | 3 refills | Status: DC

## 2019-03-29 ENCOUNTER — Telehealth: Payer: Self-pay | Admitting: Family Medicine

## 2019-03-29 NOTE — Telephone Encounter (Signed)
Patient is not  having any breat issues. But would like start getting mammograms at the [facility].

## 2019-04-02 NOTE — Telephone Encounter (Signed)
Pt to make and appointment for breast exam. Asymmetrical growth

## 2019-04-12 ENCOUNTER — Other Ambulatory Visit: Payer: Self-pay | Admitting: Family Medicine

## 2019-04-12 DIAGNOSIS — D509 Iron deficiency anemia, unspecified: Secondary | ICD-10-CM

## 2019-04-18 ENCOUNTER — Ambulatory Visit: Payer: Medicaid Other | Admitting: Family Medicine

## 2019-04-19 ENCOUNTER — Ambulatory Visit: Payer: Medicaid Other | Admitting: Family Medicine

## 2019-04-24 ENCOUNTER — Other Ambulatory Visit: Payer: Self-pay | Admitting: Family Medicine

## 2019-04-24 DIAGNOSIS — E559 Vitamin D deficiency, unspecified: Secondary | ICD-10-CM

## 2019-04-30 ENCOUNTER — Encounter: Payer: Self-pay | Admitting: Family Medicine

## 2019-04-30 ENCOUNTER — Other Ambulatory Visit: Payer: Self-pay

## 2019-04-30 ENCOUNTER — Ambulatory Visit (INDEPENDENT_AMBULATORY_CARE_PROVIDER_SITE_OTHER): Payer: Medicaid Other | Admitting: Family Medicine

## 2019-04-30 VITALS — BP 124/64 | HR 83 | Temp 98.1°F | Ht 64.0 in | Wt 161.2 lb

## 2019-04-30 DIAGNOSIS — R8290 Unspecified abnormal findings in urine: Secondary | ICD-10-CM

## 2019-04-30 DIAGNOSIS — Z09 Encounter for follow-up examination after completed treatment for conditions other than malignant neoplasm: Secondary | ICD-10-CM

## 2019-04-30 DIAGNOSIS — M549 Dorsalgia, unspecified: Secondary | ICD-10-CM

## 2019-04-30 DIAGNOSIS — M5442 Lumbago with sciatica, left side: Secondary | ICD-10-CM

## 2019-04-30 DIAGNOSIS — M5441 Lumbago with sciatica, right side: Secondary | ICD-10-CM

## 2019-04-30 DIAGNOSIS — G629 Polyneuropathy, unspecified: Secondary | ICD-10-CM

## 2019-04-30 DIAGNOSIS — I10 Essential (primary) hypertension: Secondary | ICD-10-CM | POA: Diagnosis not present

## 2019-04-30 DIAGNOSIS — N3000 Acute cystitis without hematuria: Secondary | ICD-10-CM

## 2019-04-30 DIAGNOSIS — Z0000 Encounter for general adult medical examination without abnormal findings: Secondary | ICD-10-CM | POA: Diagnosis not present

## 2019-04-30 LAB — POCT URINALYSIS DIPSTICK
Bilirubin, UA: NEGATIVE
Glucose, UA: NEGATIVE
Ketones, UA: NEGATIVE
Nitrite, UA: NEGATIVE
Protein, UA: NEGATIVE
Spec Grav, UA: 1.025
Urobilinogen, UA: 0.2 U/dL
pH, UA: 5

## 2019-04-30 LAB — POCT GLYCOSYLATED HEMOGLOBIN (HGB A1C): Hemoglobin A1C: 5.2 % (ref 4.0–5.6)

## 2019-04-30 LAB — GLUCOSE, POCT (MANUAL RESULT ENTRY): POC Glucose: 90 mg/dl (ref 70–99)

## 2019-04-30 MED ORDER — SULFAMETHOXAZOLE-TRIMETHOPRIM 800-160 MG PO TABS: 1.0000 | ORAL_TABLET | Freq: Two times a day (BID) | ORAL | 0 refills | Status: DC

## 2019-04-30 MED ORDER — GABAPENTIN 300 MG PO CAPS: ORAL_CAPSULE | ORAL | 3 refills | Status: DC

## 2019-04-30 NOTE — Patient Instructions (Signed)

## 2019-04-30 NOTE — Progress Notes (Signed)
[facility]   Established Patient Office Visit  Subjective:  Patient ID: Andrea Stone, female    DOB: 1981-03-10  Age: 38 y.o. MRN: [id number]  CC:  Chief Complaint  Patient presents with  . Follow-up    6 mth follow up    HPI Andrea Stone is a 38 year old female who presents for Follow Up today.   Past Medical History:  Diagnosis Date  . Anemia 03/2018  . Anemia 02/2019  . Hyperlipidemia   . Hypertension   . Iron deficiency anemia 03/2018  . Trichomoniasis   . UTI (urinary tract infection) during pregnancy   . Vitamin D deficiency 02/2019   Current Status: Since her last office visit, she is doing well with no complaints.  She has increased leg pain, which she is currently taking Gabapentin, with minimal relief. He denies fevers, chills, fatigue, recent infections, weight loss, and night sweats. She has not had any headaches, visual changes, dizziness, and falls. No chest pain, heart palpitations, cough and shortness of breath reported. No reports of GI problems such as nausea, vomiting, diarrhea, and constipation. She has no reports of blood in stools, dysuria and hematuria. Her anxiety is mild today. She denies suicidal ideations, homicidal ideations, or auditory hallucinations.   Past Surgical History:  Procedure Laterality Date  . CARDIOPULMONARY EXERCISE TEST  07/2017    No regional WMA. No valvular lesions. cardiopulmonary limitation seen. Peak VO2 normal for workload achieved on submaximal test. Dyspnea most likely related to deconditioning & limitations due to weight. Normal Study from a Heart & Lung standpoint.  Marland Kitchen LAPAROSCOPIC TUBAL LIGATION Bilateral 02/06/2013   Procedure: LAPAROSCOPIC TUBAL LIGATION;  Surgeon: Kathreen Cosier, MD;  Location: [facility];  Service: Gynecology;  Laterality: Bilateral;  . TRANSTHORACIC ECHOCARDIOGRAM  07/2017   EF 60-65%.  No regional WMA. No valvular lesions.  . WISDOM TOOTH EXTRACTION       Family History  Problem Relation Age of Onset  . Kidney disease Maternal Grandmother   . Heart failure Maternal Grandmother   . Hypertension Mother   . Asthma Sister   . Coronary artery disease Father        CABG - in 10s.  . Anesthesia problems Neg Hx   . Other Neg Hx     Social History   Socioeconomic History  . Marital status: Single    Spouse name: Not on file  . Number of children: 3  . Years of education: Not on file  . Highest education level: High school graduate  Occupational History  . Not on file  Social Needs  . Financial resource strain: Not on file  . Food insecurity    Worry: Not on file    Inability: Not on file  . Transportation needs    Medical: Not on file    Non-medical: Not on file  Tobacco Use  . Smoking status: Current Some Day Smoker    Packs/day: 0.10    Years: 13.00    Pack years: 1.30    Types: Cigarettes    Last attempt to quit: 05/27/2017    Years since quitting: 1.9  . Smokeless tobacco: Never Used  Substance and Sexual Activity  . Alcohol use: Yes    Comment: socially  . Drug use: No  . Sexual activity: Yes    Birth control/protection: None  Lifestyle  . Physical activity    Days per week: Not on file    Minutes per  session: Not on file  . Stress: Not on file  Relationships  . Social Musicianconnections    Talks on phone: Not on file    Gets together: Not on file    Attends religious service: Not on file    Active member of club or organization: Not on file    Attends meetings of clubs or organizations: Not on file    Relationship status: Not on file  . Intimate partner violence    Fear of current or ex partner: Not on file    Emotionally abused: Not on file    Physically abused: Not on file    Forced sexual activity: Not on file  Other Topics Concern  . Not on file  Social History Narrative   Disabled - (since childhood).    Outpatient Medications Prior to Visit  Medication Sig Dispense Refill  . ferrous sulfate 325  (65 FE) MG tablet TAKE 1 TABLET BY MOUTH EVERY DAY 90 tablet 1  . ibuprofen (ADVIL,MOTRIN) 200 MG tablet Take 200 mg by mouth every 6 (six) hours as needed.    . methocarbamol (ROBAXIN) 500 MG tablet Take 1 tablet (500 mg total) by mouth 2 (two) times daily as needed for muscle spasms. 60 tablet 2  . pravastatin (PRAVACHOL) 80 MG tablet Take 1 tablet (80 mg total) by mouth daily. 90 tablet 3  . topiramate (TOPAMAX) 25 MG tablet Take 1 tablet (25 mg total) by mouth 2 (two) times daily. 60 tablet 1  . gabapentin (NEURONTIN) 300 MG capsule Take 1 capsule (300 mg total) by mouth 2 (two) times daily. 60 capsule 3  . aspirin 81 MG EC tablet TAKE 1 TABLET BY MOUTH EVERY DAY (Patient not taking: Reported on 11/19/2018) 90 tablet 1  . diclofenac sodium (VOLTAREN) 1 % GEL Apply 4 g topically 4 (four) times daily. (Patient not taking: Reported on 11/19/2018) 1 Tube 2  . Vitamin D, Ergocalciferol, (DRISDOL) 1.25 MG (50000 UT) CAPS capsule TAKE 1 CAPSULE (50,000 UNITS TOTAL) BY MOUTH EVERY 7 (SEVEN) DAYS. (Patient not taking: Reported on 04/30/2019) 15 capsule 1   No facility-administered medications prior to visit.     Allergies  Allergen Reactions  . Zantac [Ranitidine Hcl] Swelling and Rash    ROS Review of Systems  Constitutional: Negative.   HENT: Negative.   Eyes: Negative.   Respiratory: Negative.   Cardiovascular: Negative.   Gastrointestinal: Negative.   Endocrine: Negative.   Genitourinary: Negative.   Musculoskeletal: Positive for arthralgias (generalized) and back pain (chronic).  Skin: Negative.   Allergic/Immunologic: Negative.   Neurological: Positive for numbness (legs).  Hematological: Negative.   Psychiatric/Behavioral: Negative.       Objective:    Physical Exam  Constitutional: She is oriented to person, place, and time. She appears well-developed and well-nourished.  HENT:  Head: Normocephalic and atraumatic.  Eyes: Conjunctivae are normal.  Neck: Normal range of  motion. Neck supple.  Cardiovascular: Normal rate, regular rhythm, normal heart sounds and intact distal pulses.  Pulmonary/Chest: Effort normal and breath sounds normal.  Abdominal: Soft. Bowel sounds are normal.  Musculoskeletal: Normal range of motion.  Neurological: She is alert and oriented to person, place, and time. She has normal reflexes.  Skin: Skin is warm and dry.  Psychiatric: She has a normal mood and affect. Her behavior is normal. Judgment and thought content normal.  Nursing note and vitals reviewed.   BP 124/64   Pulse 83   Temp 98.1 F (36.7 C) (Oral)  Ht 5\' 4"  (1.626 m)   Wt 161 lb 3.2 oz (73.1 kg)   LMP 03/17/2019   SpO2 97%   BMI 27.67 kg/m  Wt Readings from Last 3 Encounters:  04/30/19 161 lb 3.2 oz (73.1 kg)  03/11/19 161 lb 12.8 oz (73.4 kg)  11/19/18 164 lb (74.4 kg)     There are no preventive care reminders to display for this patient.  There are no preventive care reminders to display for this patient.  Lab Results  Component Value Date   TSH 0.613 03/11/2019   Lab Results  Component Value Date   WBC 5.8 03/11/2019   HGB 12.7 03/11/2019   HCT 38.3 03/11/2019   MCV 90 03/11/2019   PLT 338 03/11/2019   Lab Results  Component Value Date   NA 137 03/11/2019   K 4.3 03/11/2019   CO2 22 03/11/2019   GLUCOSE 89 03/11/2019   BUN 8 03/11/2019   CREATININE 0.93 03/11/2019   BILITOT 0.3 03/11/2019   ALKPHOS 86 03/11/2019   AST 27 03/11/2019   ALT 36 (H) 03/11/2019   PROT 7.0 03/11/2019   ALBUMIN 4.1 03/11/2019   CALCIUM 9.4 03/11/2019   ANIONGAP 11 07/16/2017   Lab Results  Component Value Date   CHOL 205 (H) 03/11/2019   Lab Results  Component Value Date   HDL 43 03/11/2019   Lab Results  Component Value Date   LDLCALC 147 (H) 03/11/2019   Lab Results  Component Value Date   TRIG 81 03/11/2019   Lab Results  Component Value Date   CHOLHDL 4.8 (H) 03/11/2019   Lab Results  Component Value Date   HGBA1C 5.2  04/30/2019      Assessment & Plan:   1. Essential hypertension The current medical regimen is effective; blood pressure is stable at 124/64 today; continue present plan and medications as prescribed. She will continue to take medications as prescribed, to decrease high sodium intake, excessive alcohol intake, increase potassium intake, smoking cessation, and increase physical activity of at least 30 minutes of cardio activity daily. She will continue to follow Heart Healthy or DASH diet. - Urinalysis Dipstick  2. Health care maintenance - POCT HgB A1C - Glucose (CBG)  3. Low back pain with bilateral sciatica, unspecified back pain laterality, unspecified chronicity  4. Back pain, unspecified back location, unspecified back pain laterality, unspecified chronicity Stable today.   5. Acute cystitis without hematuria We will initiate antibiotic today.  - sulfamethoxazole-trimethoprim (BACTRIM DS) 800-160 MG tablet; Take 1 tablet by mouth 2 (two) times daily.  Dispense: 14 tablet; Refill: 0  6. Neuropathy We will increase dosage of Gabapentin today.  - gabapentin (NEURONTIN) 300 MG capsule; Take 2 capsules (Total = 600 mg), 2 times a day.  Dispense: 120 capsule; Refill: 3  7. Follow up She will follow up in 6 months.   Meds ordered this encounter  Medications  . gabapentin (NEURONTIN) 300 MG capsule    Sig: Take 2 capsules (Total = 600 mg), 2 times a day.    Dispense:  120 capsule    Refill:  3  . sulfamethoxazole-trimethoprim (BACTRIM DS) 800-160 MG tablet    Sig: Take 1 tablet by mouth 2 (two) times daily.    Dispense:  14 tablet    Refill:  0    Orders Placed This Encounter  Procedures  . Urinalysis Dipstick  . POCT HgB A1C  . Glucose (CBG)    Referral Orders  No referral(s) requested today  Raliegh Ip,  MSN, FNP-BC [place] [facility] [address] [place], [place] [address] [phone]  [phone] fax  Problem List Items Addressed This Visit      Cardiovascular and Mediastinum   Essential hypertension - Primary   Relevant Orders   Urinalysis Dipstick (Completed)     Nervous and Auditory   Low back pain with bilateral sciatica   Relevant Medications   gabapentin (NEURONTIN) 300 MG capsule   Neuropathy    Other Visit Diagnoses    Health care maintenance       Relevant Orders   POCT HgB A1C (Completed)   Glucose (CBG) (Completed)   Back pain, unspecified back location, unspecified back pain laterality, unspecified chronicity       Acute cystitis without hematuria       Relevant Medications   sulfamethoxazole-trimethoprim (BACTRIM DS) 800-160 MG tablet   Follow up          Meds ordered this encounter  Medications  . gabapentin (NEURONTIN) 300 MG capsule    Sig: Take 2 capsules (Total = 600 mg), 2 times a day.    Dispense:  120 capsule    Refill:  3  . sulfamethoxazole-trimethoprim (BACTRIM DS) 800-160 MG tablet    Sig: Take 1 tablet by mouth 2 (two) times daily.    Dispense:  14 tablet    Refill:  0    Follow-up: Return in about 6 months (around 10/28/2019).    Kallie Locks, FNP

## 2019-05-01 DIAGNOSIS — M549 Dorsalgia, unspecified: Secondary | ICD-10-CM | POA: Insufficient documentation

## 2019-05-02 LAB — URINE CULTURE: Organism ID, Bacteria: NO GROWTH

## 2019-05-17 ENCOUNTER — Emergency Department (HOSPITAL_COMMUNITY): Payer: Medicaid Other

## 2019-05-17 ENCOUNTER — Other Ambulatory Visit: Payer: Self-pay

## 2019-05-17 ENCOUNTER — Inpatient Hospital Stay (HOSPITAL_COMMUNITY)
Admission: EM | Admit: 2019-05-17 | Discharge: 2019-05-18 | DRG: 894 | Discharge status: Against Medical Advice | Payer: Medicaid Other | Attending: Internal Medicine | Admitting: Internal Medicine

## 2019-05-17 ENCOUNTER — Encounter (HOSPITAL_COMMUNITY): Payer: Self-pay | Admitting: Emergency Medicine

## 2019-05-17 DIAGNOSIS — Z825 Family history of asthma and other chronic lower respiratory diseases: Secondary | ICD-10-CM | POA: Diagnosis not present

## 2019-05-17 DIAGNOSIS — E872 Acidosis: Secondary | ICD-10-CM | POA: Diagnosis present

## 2019-05-17 DIAGNOSIS — R4182 Altered mental status, unspecified: Secondary | ICD-10-CM

## 2019-05-17 DIAGNOSIS — F17210 Nicotine dependence, cigarettes, uncomplicated: Secondary | ICD-10-CM | POA: Diagnosis present

## 2019-05-17 DIAGNOSIS — Z5329 Procedure and treatment not carried out because of patient's decision for other reasons: Secondary | ICD-10-CM | POA: Diagnosis present

## 2019-05-17 DIAGNOSIS — N390 Urinary tract infection, site not specified: Secondary | ICD-10-CM | POA: Diagnosis present

## 2019-05-17 DIAGNOSIS — F10931 Alcohol use, unspecified with withdrawal delirium: Secondary | ICD-10-CM | POA: Diagnosis present

## 2019-05-17 DIAGNOSIS — Z20828 Contact with and (suspected) exposure to other viral communicable diseases: Secondary | ICD-10-CM | POA: Diagnosis present

## 2019-05-17 DIAGNOSIS — E785 Hyperlipidemia, unspecified: Secondary | ICD-10-CM | POA: Diagnosis present

## 2019-05-17 DIAGNOSIS — D509 Iron deficiency anemia, unspecified: Secondary | ICD-10-CM | POA: Diagnosis present

## 2019-05-17 DIAGNOSIS — F10231 Alcohol dependence with withdrawal delirium: Secondary | ICD-10-CM | POA: Diagnosis present

## 2019-05-17 DIAGNOSIS — F10239 Alcohol dependence with withdrawal, unspecified: Secondary | ICD-10-CM | POA: Diagnosis not present

## 2019-05-17 DIAGNOSIS — E860 Dehydration: Secondary | ICD-10-CM | POA: Diagnosis present

## 2019-05-17 DIAGNOSIS — Z8249 Family history of ischemic heart disease and other diseases of the circulatory system: Secondary | ICD-10-CM | POA: Diagnosis not present

## 2019-05-17 LAB — COMPREHENSIVE METABOLIC PANEL
ALT: 64 U/L — ABNORMAL HIGH (ref 0–44)
AST: 77 U/L — ABNORMAL HIGH (ref 15–41)
Albumin: 4.3 g/dL (ref 3.5–5.0)
Alkaline Phosphatase: 73 U/L (ref 38–126)
Anion gap: 17 — ABNORMAL HIGH (ref 5–15)
BUN: 7 mg/dL (ref 6–20)
CO2: 20 mmol/L — ABNORMAL LOW (ref 22–32)
Calcium: 9.4 mg/dL (ref 8.9–10.3)
Chloride: 100 mmol/L (ref 98–111)
Creatinine, Ser: 0.82 mg/dL (ref 0.44–1.00)
GFR calc Af Amer: >60 mL/min (ref >60)
GFR calc non Af Amer: >60 mL/min (ref >60)
Glucose, Bld: 120 mg/dL — ABNORMAL HIGH (ref 70–99)
Potassium: 3.9 mmol/L (ref 3.5–5.1)
Sodium: 137 mmol/L (ref 135–145)
Total Bilirubin: 0.8 mg/dL (ref 0.3–1.2)
Total Protein: 8.3 g/dL — ABNORMAL HIGH (ref 6.5–8.1)

## 2019-05-17 LAB — CBC WITH DIFFERENTIAL/PLATELET
Abs Immature Granulocytes: 0.02 10*3/uL (ref 0.00–0.07)
Basophils Absolute: 0 10*3/uL (ref 0.0–0.1)
Basophils Relative: 0 %
Eosinophils Absolute: 0 10*3/uL (ref 0.0–0.5)
Eosinophils Relative: 0 %
HCT: 40.7 % (ref 36.0–46.0)
Hemoglobin: 13.9 g/dL (ref 12.0–15.0)
Immature Granulocytes: 0 %
Lymphocytes Relative: 24 %
Lymphs Abs: 1.8 10*3/uL (ref 0.7–4.0)
MCH: 30.5 pg (ref 26.0–34.0)
MCHC: 34.2 g/dL (ref 30.0–36.0)
MCV: 89.3 fL (ref 80.0–100.0)
Monocytes Absolute: 0.8 10*3/uL (ref 0.1–1.0)
Monocytes Relative: 11 %
Neutro Abs: 4.8 10*3/uL (ref 1.7–7.7)
Neutrophils Relative %: 65 %
Platelets: 406 10*3/uL — ABNORMAL HIGH (ref 150–400)
RBC: 4.56 MIL/uL (ref 3.87–5.11)
RDW: 14.2 % (ref 11.5–15.5)
WBC: 7.5 10*3/uL (ref 4.0–10.5)
nRBC: 0 % (ref 0.0–0.2)

## 2019-05-17 LAB — URINALYSIS, ROUTINE W REFLEX MICROSCOPIC
Bilirubin Urine: NEGATIVE
Glucose, UA: NEGATIVE mg/dL
Hgb urine dipstick: NEGATIVE
Ketones, ur: 80 mg/dL — AB
Nitrite: NEGATIVE
Protein, ur: 100 mg/dL — AB
Specific Gravity, Urine: 1.028 (ref 1.005–1.030)
WBC, UA: >50 WBC/hpf — ABNORMAL HIGH (ref 0–5)
pH: 6 (ref 5.0–8.0)

## 2019-05-17 LAB — RAPID URINE DRUG SCREEN, HOSP PERFORMED
Amphetamines: NOT DETECTED
Barbiturates: NOT DETECTED
Benzodiazepines: NOT DETECTED
Cocaine: NOT DETECTED
Opiates: NOT DETECTED
Tetrahydrocannabinol: NOT DETECTED

## 2019-05-17 LAB — ETHANOL: Alcohol, Ethyl (B): <10 mg/dL (ref <10)

## 2019-05-17 LAB — ACETAMINOPHEN LEVEL: Acetaminophen (Tylenol), Serum: <10 ug/mL — ABNORMAL LOW (ref 10–30)

## 2019-05-17 LAB — SALICYLATE LEVEL: Salicylate Lvl: <7 mg/dL (ref 2.8–30.0)

## 2019-05-17 MED ORDER — LORAZEPAM 2 MG/ML IJ SOLN
1.0000 mg | Freq: Once | INTRAMUSCULAR | Status: AC
  Administered 2019-05-17: 1 mg via INTRAVENOUS
  Filled 2019-05-17: qty 1

## 2019-05-17 MED ORDER — SODIUM CHLORIDE 0.9 % IV SOLN
1.0000 g | Freq: Once | INTRAVENOUS | Status: AC
  Administered 2019-05-17: 1 g via INTRAVENOUS
  Filled 2019-05-17: qty 10

## 2019-05-17 MED ORDER — METRONIDAZOLE 500 MG PO TABS
500.0000 mg | ORAL_TABLET | Freq: Once | ORAL | Status: AC
  Administered 2019-05-17: 500 mg via ORAL
  Filled 2019-05-17: qty 1

## 2019-05-17 NOTE — ED Notes (Signed)
[place], MD at bedside.  

## 2019-05-17 NOTE — ED Notes (Signed)
Visitor at bedside.

## 2019-05-17 NOTE — ED Triage Notes (Signed)
Patient BIB niece, reports slip and fall x1 week ago hitting head. Niece reports altered mental status x1 week. States patient is walking out of the house and calling random people. Niece reports patient drinks daily. Denies taking blood thinner. C/o pain to left leg.

## 2019-05-17 NOTE — ED Provider Notes (Signed)
[facility]-EMERGENCY DEPT Provider Note   CSN: [id number] Arrival date & time: 05/17/19  1508     History   Chief Complaint Chief Complaint  Patient presents with  . Head Injury    HPI Andrea Stone is a 38 y.o. female.     38 year old female with prior medical history as detailed below presents for evaluation of headache.  Patient reports that she fell 1 week ago.  She struck her head.  She is not sure that she did not pass out.  She complains of persistent headache since.  Family also reports that the patient drinks heavily on a daily basis.  Per family, the patient has been complaining of significant headaches all week.  No associated fever.  No associated nausea, vomiting, chest pain, shortness of breath, or other specific complaint.  The history is provided by the patient and medical records.  Head Injury Location:  Generalized Mechanism of injury: fall   Fall:    Point of impact:  Head   Entrapped after fall: no   Pain details:    Quality:  Aching   Severity:  Mild   Timing:  Constant   Progression:  Unchanged Chronicity:  New Relieved by:  Nothing Worsened by:  Nothing   Past Medical History:  Diagnosis Date  . Anemia 03/2018  . Anemia 02/2019  . Hyperlipidemia   . Hypertension   . Iron deficiency anemia 03/2018  . Trichomoniasis   . UTI (urinary tract infection) during pregnancy   . Vitamin D deficiency 02/2019    Patient Active Problem List   Diagnosis Date Noted  . Back pain 05/01/2019  . Anemia 03/12/2019  . Iron deficiency anemia 03/12/2019  . Neuropathy 03/12/2019  . Fatigue 03/12/2019  . Essential hypertension 11/19/2018  . Sore throat (viral) 07/18/2018  . Common cold 07/18/2018  . Hyperlipidemia with target LDL less than 100 04/19/2018  . Low back pain with bilateral sciatica 03/29/2018  . DOE (dyspnea on exertion) 07/28/2017  . Chest pain with low risk for cardiac etiology 07/28/2017  . Family history of premature  coronary heart disease 07/28/2017    Past Surgical History:  Procedure Laterality Date  . CARDIOPULMONARY EXERCISE TEST  07/2017    No regional WMA. No valvular lesions. cardiopulmonary limitation seen. Peak VO2 normal for workload achieved on submaximal test. Dyspnea most likely related to deconditioning & limitations due to weight. Normal Study from a Heart & Lung standpoint.  Marland Kitchen LAPAROSCOPIC TUBAL LIGATION Bilateral 02/06/2013   Procedure: LAPAROSCOPIC TUBAL LIGATION;  Surgeon: Kathreen Cosier, MD;  Location: [facility];  Service: Gynecology;  Laterality: Bilateral;  . TRANSTHORACIC ECHOCARDIOGRAM  07/2017   EF 60-65%.  No regional WMA. No valvular lesions.  . WISDOM TOOTH EXTRACTION       OB History    Gravida  3   Para  3   Term  3   Preterm  0   AB  0   Living  3     SAB  0   TAB  0   Ectopic  0   Multiple  0   Live Births  3            Home Medications    Prior to Admission medications   Medication Sig Start Date End Date Taking? Authorizing Provider  ferrous sulfate 325 (65 FE) MG tablet TAKE 1 TABLET BY MOUTH EVERY DAY Patient taking differently: Take 325 mg by mouth daily with breakfast.  04/12/19  Yes  Kallie Locks, FNP  gabapentin (NEURONTIN) 300 MG capsule Take 2 capsules (Total = 600 mg), 2 times a day. Patient taking differently: Take 600 mg by mouth 2 (two) times daily.  04/30/19  Yes Kallie Locks, FNP  ibuprofen (ADVIL,MOTRIN) 200 MG tablet Take 200 mg by mouth every 6 (six) hours as needed.   Yes [provider]  methocarbamol (ROBAXIN) 500 MG tablet Take 1 tablet (500 mg total) by mouth 2 (two) times daily as needed for muscle spasms. 12/26/18  Yes Kallie Locks, FNP  pravastatin (PRAVACHOL) 80 MG tablet Take 1 tablet (80 mg total) by mouth daily. 04/19/18  Yes Marykay Lex, MD  topiramate (TOPAMAX) 25 MG tablet Take 1 tablet (25 mg total) by mouth 2 (two) times daily. 03/28/18  Yes Kallie Locks, FNP  aspirin 81 MG  EC tablet TAKE 1 TABLET BY MOUTH EVERY DAY Patient not taking: Reported on 11/19/2018 10/04/17   Quentin Angst, MD  diclofenac sodium (VOLTAREN) 1 % GEL Apply 4 g topically 4 (four) times daily. Patient not taking: Reported on 11/19/2018 08/31/18   Kallie Locks, FNP  sulfamethoxazole-trimethoprim (BACTRIM DS) 800-160 MG tablet Take 1 tablet by mouth 2 (two) times daily. Patient not taking: Reported on 05/17/2019 04/30/19   Kallie Locks, FNP  Vitamin D, Ergocalciferol, (DRISDOL) 1.25 MG (50000 UT) CAPS capsule TAKE 1 CAPSULE (50,000 UNITS TOTAL) BY MOUTH EVERY 7 (SEVEN) DAYS. Patient not taking: Reported on 04/30/2019 04/24/19   Kallie Locks, FNP    Family History Family History  Problem Relation Age of Onset  . Kidney disease Maternal Grandmother   . Heart failure Maternal Grandmother   . Hypertension Mother   . Asthma Sister   . Coronary artery disease Father        CABG - in 96s.  . Anesthesia problems Neg Hx   . Other Neg Hx     Social History Social History   Tobacco Use  . Smoking status: Current Some Day Smoker    Packs/day: 0.10    Years: 13.00    Pack years: 1.30    Types: Cigarettes    Last attempt to quit: 05/27/2017    Years since quitting: 1.9  . Smokeless tobacco: Never Used  Substance Use Topics  . Alcohol use: Yes    Comment: socially  . Drug use: No     Allergies   Zantac [ranitidine hcl]   Review of Systems Review of Systems  All other systems reviewed and are negative.    Physical Exam Updated Vital Signs BP 111/80   Pulse (!) 117   Temp 98.4 F (36.9 C)   Resp 17   SpO2 100%   Physical Exam Vitals signs and nursing note reviewed.  Constitutional:      General: She is not in acute distress.    Appearance: She is well-developed.  HENT:     Head: Normocephalic and atraumatic.  Eyes:     Conjunctiva/sclera: Conjunctivae normal.     Pupils: Pupils are equal, round, and reactive to light.  Neck:     Musculoskeletal:  Normal range of motion and neck supple.  Cardiovascular:     Rate and Rhythm: Normal rate and regular rhythm.     Heart sounds: Normal heart sounds.  Pulmonary:     Effort: Pulmonary effort is normal. No respiratory distress.     Breath sounds: Normal breath sounds.  Abdominal:     General: There is no distension.  Palpations: Abdomen is soft.     Tenderness: There is no abdominal tenderness.  Musculoskeletal: Normal range of motion.        General: No deformity.  Skin:    General: Skin is warm and dry.  Neurological:     General: No focal deficit present.     Mental Status: She is alert and oriented to person, place, and time. Mental status is at baseline.     Cranial Nerves: No cranial nerve deficit.     Sensory: No sensory deficit.     Motor: No weakness.     Coordination: Coordination normal.      ED Treatments / Results  Labs (all labs ordered are listed, but only abnormal results are displayed) Labs Reviewed  ACETAMINOPHEN LEVEL - Abnormal; Notable for the following components:      Result Value   Acetaminophen (Tylenol), Serum <10 (*)    All other components within normal limits  COMPREHENSIVE METABOLIC PANEL - Abnormal; Notable for the following components:   CO2 20 (*)    Glucose, Bld 120 (*)    Total Protein 8.3 (*)    AST 77 (*)    ALT 64 (*)    Anion gap 17 (*)    All other components within normal limits  CBC WITH DIFFERENTIAL/PLATELET - Abnormal; Notable for the following components:   Platelets 406 (*)    All other components within normal limits  URINALYSIS, ROUTINE W REFLEX MICROSCOPIC - Abnormal; Notable for the following components:   Color, Urine AMBER (*)    APPearance CLOUDY (*)    Ketones, ur 80 (*)    Protein, ur 100 (*)    Leukocytes,Ua LARGE (*)    WBC, UA >50 (*)    Bacteria, UA MANY (*)    All other components within normal limits  URINE CULTURE  ETHANOL  SALICYLATE LEVEL  RAPID URINE DRUG SCREEN, HOSP PERFORMED    EKG EKG  Interpretation  Date/Time:  Friday May 17 2019 18:33:03 EST Ventricular Rate:  110 PR Interval:    QRS Duration: 82 QT Interval:  334 QTC Calculation: 452 R Axis:   66 Text Interpretation: Sinus tachycardia Confirmed by Kristine RoyalMessick,  [phone]([id number]) on 05/17/2019 6:37:06 PM   Radiology Ct Head Wo Contrast  Result Date: 05/17/2019 CLINICAL DATA:  Head trauma headache altered EXAM: CT HEAD WITHOUT CONTRAST TECHNIQUE: Contiguous axial images were obtained from the base of the skull through the vertex without intravenous contrast. COMPARISON:  None. FINDINGS: Brain: Mild motion degradation. No acute territorial infarction, hemorrhage or intracranial mass. Normal ventricle size Vascular: No hyperdense vessel or unexpected calcification. Skull: Normal. Negative for fracture or focal lesion. Sinuses/Orbits: No acute finding. Other: None. IMPRESSION: Negative non contrasted CT appearance of the brain Electronically Signed   By: Jasmine PangKim  Fujinaga M.D.   On: 05/17/2019 17:05    Procedures Procedures (including critical care time)  Medications Ordered in ED Medications  LORazepam (ATIVAN) injection 1 mg (has no administration in time range)     Initial Impression / Assessment and Plan / ED Course  I have reviewed the triage vital signs and the nursing notes.  Pertinent labs & imaging results that were available during my care of the patient were reviewed by me and considered in my medical decision making (see chart for details).         MDM  Screen complete  Violeta GelinasRhonda Baetz was evaluated in Emergency Department on 05/17/2019 for the symptoms described in the history of present illness. She  was evaluated in the context of the global COVID-19 pandemic, which necessitated consideration that the patient might be at risk for infection with the SARS-CoV-2 virus that causes COVID-19. Institutional protocols and algorithms that pertain to the evaluation of patients at risk for COVID-19 are in a state of  rapid change based on information released by regulatory bodies including the CDC and federal and state organizations. These policies and algorithms were followed during the patient's care in the ED.   Patient is presenting for evaluation with family members for confusion, delirium, and AMS.  Given patient's lungs and history of heavy alcohol use, I am suspicious of early DT's.  Initial CIWA score in the ED is 4-5. No reported history of significant withdrawal symptoms given by patient or family.  Screening labs also demonstrate presence of UTI/Trich.   Hospitalist service is aware of case and will evaluate for admission.     Final Clinical Impressions(s) / ED Diagnoses   Final diagnoses:  Delirium tremens ([place])  Urinary tract infection without hematuria, site unspecified    ED Discharge Orders    None       Valarie Merino, MD 05/17/19 1928

## 2019-05-17 NOTE — H&P (Signed)
History and Physical    Andrea Stone:[id number] DOB: 07-15-1980 DOA: 05/17/2019  PCP: Unknown I have personally briefly reviewed the ER notes.  Chief Complaint: Confusion and headache.  Now she is complaining of some chest discomfort after having some liquids  HPI: Andrea Stone is a 38 y.o. female with medical history significant of  ED Course: This 38 year old lady presented to the emergency room primarily with a complaint of headache.  She reported that she had fallen and injured her head a week ago.  No history of loss of consciousness was forthcoming.  Patient is chronic heavy alcohol user and recently been more confused than usual. She had no chest pain on presentation before.  No significant abdominal pain was noted.  She denied nausea vomiting or diarrhea.  No dyspnea was noted at rest the patient.  My examination the patient is answering questions appropriately but is not fully oriented to time she is oriented to place and person however  Review of Systems: As per HPI otherwise 10 point review of systems negative.   Past Medical History:  Diagnosis Date  . Anemia 03/2018  . Anemia 02/2019  . Hyperlipidemia   . Hypertension   . Iron deficiency anemia 03/2018  . Trichomoniasis   . UTI (urinary tract infection) during pregnancy   . Vitamin D deficiency 02/2019    Past Surgical History:  Procedure Laterality Date  . CARDIOPULMONARY EXERCISE TEST  07/2017    No regional WMA. No valvular lesions. cardiopulmonary limitation seen. Peak VO2 normal for workload achieved on submaximal test. Dyspnea most likely related to deconditioning & limitations due to weight. Normal Study from a Heart & Lung standpoint.  Marland Kitchen LAPAROSCOPIC TUBAL LIGATION Bilateral 02/06/2013   Procedure: LAPAROSCOPIC TUBAL LIGATION;  Surgeon: Kathreen Cosier, MD;  Location: [facility];  Service: Gynecology;  Laterality: Bilateral;  . TRANSTHORACIC ECHOCARDIOGRAM  07/2017   EF 60-65%.  No regional WMA. No valvular  lesions.  . WISDOM TOOTH EXTRACTION       reports that she has been smoking cigarettes. She has a 1.30 pack-year smoking history. She has never used smokeless tobacco. She reports current alcohol use. She reports that she does not use drugs.  Allergies  Allergen Reactions  . Zantac [Ranitidine Hcl] Swelling and Rash    Family History  Problem Relation Age of Onset  . Kidney disease Maternal Grandmother   . Heart failure Maternal Grandmother   . Hypertension Mother   . Asthma Sister   . Coronary artery disease Father        CABG - in 56s.  . Anesthesia problems Neg Hx   . Other Neg Hx     Prior to Admission medications   Medication Sig Start Date End Date Taking? Authorizing Provider  ferrous sulfate 325 (65 FE) MG tablet TAKE 1 TABLET BY MOUTH EVERY DAY Patient taking differently: Take 325 mg by mouth daily with breakfast.  04/12/19  Yes Kallie Locks, FNP  gabapentin (NEURONTIN) 300 MG capsule Take 2 capsules (Total = 600 mg), 2 times a day. Patient taking differently: Take 600 mg by mouth 2 (two) times daily.  04/30/19  Yes Kallie Locks, FNP  ibuprofen (ADVIL,MOTRIN) 200 MG tablet Take 200 mg by mouth every 6 (six) hours as needed.   Yes [provider]  methocarbamol (ROBAXIN) 500 MG tablet Take 1 tablet (500 mg total) by mouth 2 (two) times daily as needed for muscle spasms. 12/26/18  Yes Kallie Locks, FNP  pravastatin (PRAVACHOL)  80 MG tablet Take 1 tablet (80 mg total) by mouth daily. 04/19/18  Yes Marykay LexHarding, David W, MD  topiramate (TOPAMAX) 25 MG tablet Take 1 tablet (25 mg total) by mouth 2 (two) times daily. 03/28/18  Yes Kallie LocksStroud, Natalie M, FNP  aspirin 81 MG EC tablet TAKE 1 TABLET BY MOUTH EVERY DAY Patient not taking: Reported on 11/19/2018 10/04/17   Quentin AngstJegede, Olugbemiga E, MD  diclofenac sodium (VOLTAREN) 1 % GEL Apply 4 g topically 4 (four) times daily. Patient not taking: Reported on 11/19/2018 08/31/18   Kallie LocksStroud, Natalie M, FNP   sulfamethoxazole-trimethoprim (BACTRIM DS) 800-160 MG tablet Take 1 tablet by mouth 2 (two) times daily. Patient not taking: Reported on 05/17/2019 04/30/19   Kallie LocksStroud, Natalie M, FNP  Vitamin D, Ergocalciferol, (DRISDOL) 1.25 MG (50000 UT) CAPS capsule TAKE 1 CAPSULE (50,000 UNITS TOTAL) BY MOUTH EVERY 7 (SEVEN) DAYS. Patient not taking: Reported on 04/30/2019 04/24/19   Kallie LocksStroud, Natalie M, FNP    Physical Exam: Vitals:   05/17/19 1841 05/17/19 1930 05/17/19 2000 05/17/19 2030  BP: (!) 185/100 (!) 134/103 (!) 127/94 (!) 121/92  Pulse: (!) 114  100 (!) 105  Resp:  (!) 25 (!) 23 18  Temp:      SpO2:   100% 100%    Constitutional: NAD, calm, comfortable Vitals:   05/17/19 1841 05/17/19 1930 05/17/19 2000 05/17/19 2030  BP: (!) 185/100 (!) 134/103 (!) 127/94 (!) 121/92  Pulse: (!) 114  100 (!) 105  Resp:  (!) 25 (!) 23 18  Temp:      SpO2:   100% 100%   Eyes: PERRL, lids and conjunctivae normal ENMT: Mucous membranes are dry. Posterior pharynx clear of any exudate or lesions.Normal dentition.  Neck: normal, supple, no masses, no thyromegaly Respiratory: clear to auscultation bilaterally, no extra sounds noted Cardiovascular: Regular rate and rhythm tachycardia is noted, no murmurs / rubs / gallops. No extremity edema. 2+ pedal pulses. No carotid bruits.  Abdomen: no tenderness, no masses palpated. No hepatosplenomegaly. Bowel sounds positive.  Musculoskeletal:  Good ROM, no contractures. Normal muscle tone. . No pedal edema Skin: no rashes, lesions, ulcers. No induration Neurologic: CN 2-12 grossly intact. Sensation intact, DTR normal. Strength 5/5 in all 4.  No asterixis is noted Psychiatric:. Alert and oriented x 2.  She is somewhat withdrawn and slow in response.  Does not have any tremulousness  (Labs on Admission: I have personally reviewed following labs and imaging studies Evaded protein platelet count noted.  She is somewhat acidotic on basic metabolic panel.. AST is higher  than ALT.  She has evidence of urinary tract infection CBC: Recent Labs  Lab 05/17/19 1627  WBC 7.5  NEUTROABS 4.8  HGB 13.9  HCT 40.7  MCV 89.3  PLT 406*   Basic Metabolic Panel: Recent Labs  Lab 05/17/19 1627  NA 137  K 3.9  CL 100  CO2 20*  GLUCOSE 120*  BUN 7  CREATININE 0.82  CALCIUM 9.4   GFR: CrCl cannot be calculated (Unknown ideal weight.). Liver Function Tests: Recent Labs  Lab 05/17/19 1627  AST 77*  ALT 64*  ALKPHOS 73  BILITOT 0.8  PROT 8.3*  ALBUMIN 4.3   No results for input(s): LIPASE, AMYLASE in the last 168 hours. No results for input(s): AMMONIA in the last 168 hours. Coagulation Profile: No results for input(s): INR, PROTIME in the last 168 hours. Cardiac Enzymes: No results for input(s): CKTOTAL, CKMB, CKMBINDEX, TROPONINI in the last 168 hours. BNP (last  3 results) No results for input(s): PROBNP in the last 8760 hours. HbA1C: No results for input(s): HGBA1C in the last 72 hours. CBG: No results for input(s): GLUCAP in the last 168 hours. Lipid Profile: No results for input(s): CHOL, HDL, LDLCALC, TRIG, CHOLHDL, LDLDIRECT in the last 72 hours. Thyroid Function Tests: No results for input(s): TSH, T4TOTAL, FREET4, T3FREE, THYROIDAB in the last 72 hours. Anemia Panel: No results for input(s): VITAMINB12, FOLATE, FERRITIN, TIBC, IRON, RETICCTPCT in the last 72 hours. Urine analysis:    Component Value Date/Time   COLORURINE AMBER (A) 05/17/2019 1710   APPEARANCEUR CLOUDY (A) 05/17/2019 1710   LABSPEC 1.028 05/17/2019 1710   PHURINE 6.0 05/17/2019 1710   GLUCOSEU NEGATIVE 05/17/2019 1710   HGBUR NEGATIVE 05/17/2019 1710   BILIRUBINUR NEGATIVE 05/17/2019 1710   BILIRUBINUR Negative 04/30/2019 1151   KETONESUR 80 (A) 05/17/2019 1710   PROTEINUR 100 (A) 05/17/2019 1710   UROBILINOGEN 0.2 04/30/2019 1151   UROBILINOGEN 0.2 06/20/2017 0936   NITRITE NEGATIVE 05/17/2019 1710   LEUKOCYTESUR LARGE (A) 05/17/2019 1710    Radiological  Exams on Admission: Ct Head Wo Contrast  Result Date: 05/17/2019 CLINICAL DATA:  Head trauma headache altered EXAM: CT HEAD WITHOUT CONTRAST TECHNIQUE: Contiguous axial images were obtained from the base of the skull through the vertex without intravenous contrast. COMPARISON:  None. FINDINGS: Brain: Mild motion degradation. No acute territorial infarction, hemorrhage or intracranial mass. Normal ventricle size Vascular: No hyperdense vessel or unexpected calcification. Skull: Normal. Negative for fracture or focal lesion. Sinuses/Orbits: No acute finding. Other: None. IMPRESSION: Negative non contrasted CT appearance of the brain Electronically Signed   By: Donavan Foil M.D.   On: 05/17/2019 17:05    EKG: Sinus tachycardia is noted  Assessment/Plan #1 early alcohol withdrawa confusion tachycardia and hypertension l/will admit under CIWA guidelines.  Hopefully anxiolytic use will help the tachycardia and hypertension.  IV fluids are being provided. #2 tract infection.  Start patient on IV Rocephin. #3 clinical dehydration and some acidosis.  IV fluids have been provided. #4 history of anemia.  Current hemoglobin is normal. Her medications also include Topamax and gabapentin.  There use and indication need to be clarified  DVT prophylaxis: We will provide Lovenox while she is an active Code Status: Full Family Communication: No family present Disposition Plan: I do not feel that she will be ready to go home before 2 days she may deteriorate further with withdrawal. Consults called:  Admission status: Full   Pearlean Brownie MD [facility] Pager [phone]   If 7PM-7AM, please contact night-coverage [email or web address] Password [email or web address]  05/17/2019, 8:52 PM

## 2019-05-17 NOTE — ED Notes (Signed)
Patient returned from CT

## 2019-05-18 ENCOUNTER — Other Ambulatory Visit: Payer: Self-pay

## 2019-05-18 ENCOUNTER — Emergency Department (HOSPITAL_COMMUNITY)
Admission: EM | Admit: 2019-05-18 | Discharge: 2019-05-19 | Discharge status: Against Medical Advice | Payer: Medicaid Other

## 2019-05-18 LAB — CBC
HCT: 40.3 % (ref 36.0–46.0)
Hemoglobin: 13.6 g/dL (ref 12.0–15.0)
MCH: 31 pg (ref 26.0–34.0)
MCHC: 33.7 g/dL (ref 30.0–36.0)
MCV: 91.8 fL (ref 80.0–100.0)
Platelets: 362 10*3/uL (ref 150–400)
RBC: 4.39 MIL/uL (ref 3.87–5.11)
RDW: 14.4 % (ref 11.5–15.5)
WBC: 7.3 10*3/uL (ref 4.0–10.5)
nRBC: 0 % (ref 0.0–0.2)

## 2019-05-18 LAB — COMPREHENSIVE METABOLIC PANEL
ALT: 56 U/L — ABNORMAL HIGH (ref 0–44)
AST: 57 U/L — ABNORMAL HIGH (ref 15–41)
Albumin: 4 g/dL (ref 3.5–5.0)
Alkaline Phosphatase: 64 U/L (ref 38–126)
Anion gap: 11 (ref 5–15)
BUN: 9 mg/dL (ref 6–20)
CO2: 27 mmol/L (ref 22–32)
Calcium: 9.5 mg/dL (ref 8.9–10.3)
Chloride: 101 mmol/L (ref 98–111)
Creatinine, Ser: 0.85 mg/dL (ref 0.44–1.00)
GFR calc Af Amer: >60 mL/min (ref >60)
GFR calc non Af Amer: >60 mL/min (ref >60)
Glucose, Bld: 103 mg/dL — ABNORMAL HIGH (ref 70–99)
Potassium: 3.8 mmol/L (ref 3.5–5.1)
Sodium: 139 mmol/L (ref 135–145)
Total Bilirubin: 0.6 mg/dL (ref 0.3–1.2)
Total Protein: 7.6 g/dL (ref 6.5–8.1)

## 2019-05-18 LAB — HIV ANTIBODY (ROUTINE TESTING W REFLEX): HIV Screen 4th Generation wRfx: NONREACTIVE

## 2019-05-18 LAB — SARS CORONAVIRUS 2 (TAT 6-24 HRS): SARS Coronavirus 2: NEGATIVE

## 2019-05-18 MED ORDER — ACETAMINOPHEN 650 MG RE SUPP: 650.0000 mg | Freq: Four times a day (QID) | RECTAL | Status: DC | PRN

## 2019-05-18 MED ORDER — ENOXAPARIN SODIUM 40 MG/0.4ML ~~LOC~~ SOLN: 40.0000 mg | SUBCUTANEOUS | Status: DC

## 2019-05-18 MED ORDER — ONDANSETRON HCL 4 MG/2ML IJ SOLN: 4.0000 mg | Freq: Four times a day (QID) | INTRAMUSCULAR | Status: DC | PRN

## 2019-05-18 MED ORDER — SODIUM CHLORIDE 0.9 % IV SOLN: 1.0000 g | INTRAVENOUS | Status: DC

## 2019-05-18 MED ORDER — ACETAMINOPHEN 325 MG PO TABS: 650.0000 mg | ORAL_TABLET | Freq: Four times a day (QID) | ORAL | Status: DC | PRN

## 2019-05-18 MED ORDER — VITAMIN B-1 100 MG PO TABS: 100.0000 mg | ORAL_TABLET | Freq: Every day | ORAL | Status: DC

## 2019-05-18 MED ORDER — FOLIC ACID 1 MG PO TABS: 1.0000 mg | ORAL_TABLET | Freq: Every day | ORAL | Status: DC

## 2019-05-18 MED ORDER — ONDANSETRON HCL 4 MG PO TABS: 4.0000 mg | ORAL_TABLET | Freq: Four times a day (QID) | ORAL | Status: DC | PRN

## 2019-05-18 NOTE — ED Notes (Signed)
She has eaten breakfast and has informed us that she is "leaving now". She allows our C.N. to d/c her I.V. and thence leaves our department. She ambulates without difficulty. The hospitalist is notified by me of this event.

## 2019-05-18 NOTE — ED Notes (Signed)
Pt told this writer that she does not want to be here and wants to be with her children. Pt was encouraged to be seen, but ran out to the lobby. Pt's daughter told this writer that registration contacted the wrong emergency contact and was upset that pt was not being seen. It was explained to family that pt does not have a guardian and due to HIPPA there was not much information that can be discussed with the family. Daughter insisted that pt be see and that she is her legal guardian. This Probation officer informed the daughter that there was not any legal guardian information in the system and we cannot force her to be seen. Pt's daughter became very upset and stated "I do not have to discuss this with you. I want to speak with a supervisor." Call was transfer to [place] Crumb.

## 2019-05-18 NOTE — ED Notes (Signed)
Patient out of room, requesting to leave. Reports that she does not need to be here. Patient walked through double doors and left without signing AMA.

## 2019-05-18 NOTE — ED Notes (Signed)
Pt sister called. She is upset that we cannot hold this patient against her will. Patient states that she does not want medical treatment or evaluation and denies SI. She is A&Ox4 and ambulatory without assistance. We have no records of a legal guardian and double checked this with registration. Sister made aware that she can fill out IVC papers if she feels that the patient is in danger, but we can not force the patient to be seen.

## 2019-05-19 ENCOUNTER — Encounter (HOSPITAL_COMMUNITY): Payer: Self-pay | Admitting: Emergency Medicine

## 2019-05-19 ENCOUNTER — Emergency Department (HOSPITAL_COMMUNITY)
Admission: EM | Admit: 2019-05-19 | Discharge: 2019-05-19 | Discharge status: Against Medical Advice | Payer: Medicaid Other | Attending: Emergency Medicine | Admitting: Emergency Medicine

## 2019-05-19 ENCOUNTER — Emergency Department (HOSPITAL_COMMUNITY)
Admission: EM | Admit: 2019-05-19 | Discharge: 2019-05-19 | Disposition: A | Discharge status: Home / Self Care | Payer: Medicaid Other | Source: Home / Self Care | Attending: Emergency Medicine | Admitting: Emergency Medicine

## 2019-05-19 ENCOUNTER — Other Ambulatory Visit: Payer: Self-pay

## 2019-05-19 DIAGNOSIS — Z7982 Long term (current) use of aspirin: Secondary | ICD-10-CM | POA: Insufficient documentation

## 2019-05-19 DIAGNOSIS — F17210 Nicotine dependence, cigarettes, uncomplicated: Secondary | ICD-10-CM | POA: Insufficient documentation

## 2019-05-19 DIAGNOSIS — I10 Essential (primary) hypertension: Secondary | ICD-10-CM | POA: Insufficient documentation

## 2019-05-19 DIAGNOSIS — F419 Anxiety disorder, unspecified: Secondary | ICD-10-CM

## 2019-05-19 DIAGNOSIS — F23 Brief psychotic disorder: Secondary | ICD-10-CM | POA: Insufficient documentation

## 2019-05-19 DIAGNOSIS — F1020 Alcohol dependence, uncomplicated: Secondary | ICD-10-CM | POA: Insufficient documentation

## 2019-05-19 DIAGNOSIS — Z5329 Procedure and treatment not carried out because of patient's decision for other reasons: Secondary | ICD-10-CM | POA: Diagnosis not present

## 2019-05-19 DIAGNOSIS — F410 Panic disorder [episodic paroxysmal anxiety] without agoraphobia: Secondary | ICD-10-CM | POA: Insufficient documentation

## 2019-05-19 DIAGNOSIS — Z79899 Other long term (current) drug therapy: Secondary | ICD-10-CM | POA: Insufficient documentation

## 2019-05-19 DIAGNOSIS — F22 Delusional disorders: Secondary | ICD-10-CM

## 2019-05-19 LAB — URINE CULTURE: Culture: <10000 — AB

## 2019-05-19 LAB — COMPREHENSIVE METABOLIC PANEL
ALT: 50 U/L — ABNORMAL HIGH (ref 0–44)
AST: 42 U/L — ABNORMAL HIGH (ref 15–41)
Albumin: 4 g/dL (ref 3.5–5.0)
Alkaline Phosphatase: 65 U/L (ref 38–126)
Anion gap: 12 (ref 5–15)
BUN: 5 mg/dL — ABNORMAL LOW (ref 6–20)
CO2: 24 mmol/L (ref 22–32)
Calcium: 9.5 mg/dL (ref 8.9–10.3)
Chloride: 100 mmol/L (ref 98–111)
Creatinine, Ser: 0.82 mg/dL (ref 0.44–1.00)
GFR calc Af Amer: >60 mL/min (ref >60)
GFR calc non Af Amer: >60 mL/min (ref >60)
Glucose, Bld: 122 mg/dL — ABNORMAL HIGH (ref 70–99)
Potassium: 3.3 mmol/L — ABNORMAL LOW (ref 3.5–5.1)
Sodium: 136 mmol/L (ref 135–145)
Total Bilirubin: 0.3 mg/dL (ref 0.3–1.2)
Total Protein: 7.5 g/dL (ref 6.5–8.1)

## 2019-05-19 LAB — CBC
HCT: 40 % (ref 36.0–46.0)
Hemoglobin: 13.4 g/dL (ref 12.0–15.0)
MCH: 30.5 pg (ref 26.0–34.0)
MCHC: 33.5 g/dL (ref 30.0–36.0)
MCV: 90.9 fL (ref 80.0–100.0)
Platelets: 352 10*3/uL (ref 150–400)
RBC: 4.4 MIL/uL (ref 3.87–5.11)
RDW: 14 % (ref 11.5–15.5)
WBC: 6.2 10*3/uL (ref 4.0–10.5)
nRBC: 0 % (ref 0.0–0.2)

## 2019-05-19 LAB — I-STAT BETA HCG BLOOD, ED (MC, WL, AP ONLY): I-stat hCG, quantitative: <5 m[IU]/mL (ref <5)

## 2019-05-19 LAB — ETHANOL: Alcohol, Ethyl (B): <10 mg/dL (ref <10)

## 2019-05-19 MED ORDER — HYDROXYZINE HCL 25 MG PO TABS: 25.0000 mg | ORAL_TABLET | Freq: Three times a day (TID) | ORAL | 0 refills | Status: AC

## 2019-05-19 MED ORDER — LORAZEPAM 2 MG/ML IJ SOLN: 0.0000 mg | Freq: Four times a day (QID) | INTRAMUSCULAR | Status: DC

## 2019-05-19 MED ORDER — LORAZEPAM 2 MG/ML IJ SOLN: 0.0000 mg | Freq: Two times a day (BID) | INTRAMUSCULAR | Status: DC

## 2019-05-19 MED ORDER — VITAMIN B-1 100 MG PO TABS
100.0000 mg | ORAL_TABLET | Freq: Every day | ORAL | Status: DC
  Administered 2019-05-19: 100 mg via ORAL
  Filled 2019-05-19: qty 1

## 2019-05-19 MED ORDER — THIAMINE HCL 100 MG/ML IJ SOLN: 100.0000 mg | Freq: Every day | INTRAMUSCULAR | Status: DC

## 2019-05-19 MED ORDER — LORAZEPAM 1 MG PO TABS: 0.0000 mg | ORAL_TABLET | Freq: Two times a day (BID) | ORAL | Status: DC

## 2019-05-19 MED ORDER — LORAZEPAM 1 MG PO TABS
0.0000 mg | ORAL_TABLET | Freq: Four times a day (QID) | ORAL | Status: DC
  Administered 2019-05-19: 1 mg via ORAL
  Filled 2019-05-19: qty 1

## 2019-05-19 NOTE — ED Provider Notes (Cosign Needed)
[facility] Provider Note   CSN: [id number] Arrival date & time: 05/19/19  0307     History   Chief Complaint Chief Complaint  Patient presents with  . Panic Attack / Anxiety    HPI Andrea Stone is a 38 y.o. female.     HPI Patient presents to the emergency department with increasing anxiety and depression.  The patient states that she has been very anxious and having panic attacks.  She states she has not taken her anxiety medications in a while.  The patient states that she left [facility] because she could not be there any longer.  Patient denies being suicidal or homicidal to me.  The patient denies any pain but will not give any other history.  The patient starts crying when I asked her if anything has recently changed that is caused her to feel more anxiety. Past Medical History:  Diagnosis Date  . Anemia 03/2018  . Anemia 02/2019  . Hyperlipidemia   . Hypertension   . Iron deficiency anemia 03/2018  . Trichomoniasis   . UTI (urinary tract infection) during pregnancy   . Vitamin D deficiency 02/2019    Patient Active Problem List   Diagnosis Date Noted  . Alcohol withdrawal delirium (HCC) 05/17/2019  . Back pain 05/01/2019  . Anemia 03/12/2019  . Iron deficiency anemia 03/12/2019  . Neuropathy 03/12/2019  . Fatigue 03/12/2019  . Essential hypertension 11/19/2018  . Sore throat (viral) 07/18/2018  . Common cold 07/18/2018  . Hyperlipidemia with target LDL less than 100 04/19/2018  . Low back pain with bilateral sciatica 03/29/2018  . DOE (dyspnea on exertion) 07/28/2017  . Chest pain with low risk for cardiac etiology 07/28/2017  . Family history of premature coronary heart disease 07/28/2017    Past Surgical History:  Procedure Laterality Date  . CARDIOPULMONARY EXERCISE TEST  07/2017    No regional WMA. No valvular lesions. cardiopulmonary limitation seen. Peak VO2 normal for workload achieved on submaximal test.  Dyspnea most likely related to deconditioning & limitations due to weight. Normal Study from a Heart & Lung standpoint.  Marland Kitchen. LAPAROSCOPIC TUBAL LIGATION Bilateral 02/06/2013   Procedure: LAPAROSCOPIC TUBAL LIGATION;  Surgeon: Kathreen CosierBernard A Marshall, MD;  Location: [facility];  Service: Gynecology;  Laterality: Bilateral;  . TRANSTHORACIC ECHOCARDIOGRAM  07/2017   EF 60-65%.  No regional WMA. No valvular lesions.  . WISDOM TOOTH EXTRACTION       OB History    Gravida  3   Para  3   Term  3   Preterm  0   AB  0   Living  3     SAB  0   TAB  0   Ectopic  0   Multiple  0   Live Births  3            Home Medications    Prior to Admission medications   Medication Sig Start Date End Date Taking? Authorizing Provider  aspirin 81 MG EC tablet TAKE 1 TABLET BY MOUTH EVERY DAY Patient not taking: Reported on 11/19/2018 10/04/17   Quentin AngstJegede, Olugbemiga E, MD  diclofenac sodium (VOLTAREN) 1 % GEL Apply 4 g topically 4 (four) times daily. Patient not taking: Reported on 11/19/2018 08/31/18   Kallie LocksStroud, Natalie M, FNP  ferrous sulfate 325 (65 FE) MG tablet TAKE 1 TABLET BY MOUTH EVERY DAY Patient taking differently: Take 325 mg by mouth daily with breakfast.  04/12/19   Raliegh IpStroud, Natalie  M, FNP  gabapentin (NEURONTIN) 300 MG capsule Take 2 capsules (Total = 600 mg), 2 times a day. Patient taking differently: Take 600 mg by mouth 2 (two) times daily.  04/30/19   Kallie Locks, FNP  ibuprofen (ADVIL,MOTRIN) 200 MG tablet Take 200 mg by mouth every 6 (six) hours as needed.    [provider]  methocarbamol (ROBAXIN) 500 MG tablet Take 1 tablet (500 mg total) by mouth 2 (two) times daily as needed for muscle spasms. 12/26/18   Kallie Locks, FNP  pravastatin (PRAVACHOL) 80 MG tablet Take 1 tablet (80 mg total) by mouth daily. 04/19/18   Marykay Lex, MD  sulfamethoxazole-trimethoprim (BACTRIM DS) 800-160 MG tablet Take 1 tablet by mouth 2 (two) times daily. Patient not taking: Reported  on 05/17/2019 04/30/19   Kallie Locks, FNP  topiramate (TOPAMAX) 25 MG tablet Take 1 tablet (25 mg total) by mouth 2 (two) times daily. 03/28/18   Kallie Locks, FNP  Vitamin D, Ergocalciferol, (DRISDOL) 1.25 MG (50000 UT) CAPS capsule TAKE 1 CAPSULE (50,000 UNITS TOTAL) BY MOUTH EVERY 7 (SEVEN) DAYS. Patient not taking: Reported on 04/30/2019 04/24/19   Kallie Locks, FNP    Family History Family History  Problem Relation Age of Onset  . Kidney disease Maternal Grandmother   . Heart failure Maternal Grandmother   . Hypertension Mother   . Asthma Sister   . Coronary artery disease Father        CABG - in 82s.  . Anesthesia problems Neg Hx   . Other Neg Hx     Social History Social History   Tobacco Use  . Smoking status: Current Some Day Smoker    Packs/day: 0.10    Years: 13.00    Pack years: 1.30    Types: Cigarettes    Last attempt to quit: 05/27/2017    Years since quitting: 1.9  . Smokeless tobacco: Never Used  Substance Use Topics  . Alcohol use: Yes    Comment: socially  . Drug use: No     Allergies   Zantac [ranitidine hcl]   Review of Systems Review of Systems  Level 5 caveat applies due to poor historian Physical Exam Updated Vital Signs BP (!) 139/101 (BP Location: Right Arm)   Pulse 95   Temp 98.6 F (37 C) (Oral)   Resp 20   SpO2 99%   Physical Exam Vitals signs and nursing note reviewed.  Constitutional:      General: She is not in acute distress.    Appearance: She is well-developed.  HENT:     Head: Normocephalic and atraumatic.  Eyes:     Pupils: Pupils are equal, round, and reactive to light.  Neck:     Musculoskeletal: Normal range of motion and neck supple.  Cardiovascular:     Rate and Rhythm: Normal rate and regular rhythm.     Heart sounds: Normal heart sounds. No murmur. No friction rub. No gallop.   Pulmonary:     Effort: Pulmonary effort is normal. No respiratory distress.     Breath sounds: Normal breath  sounds. No wheezing.  Skin:    General: Skin is warm and dry.     Capillary Refill: Capillary refill takes less than 2 seconds.     Findings: No erythema or rash.  Neurological:     Mental Status: She is alert and oriented to person, place, and time.     Motor: No abnormal muscle tone.  Coordination: Coordination normal.  Psychiatric:        Attention and Perception: She does not perceive auditory or visual hallucinations.        Mood and Affect: Mood is anxious.        Speech: Speech normal.        Thought Content: Thought content is not paranoid or delusional. Thought content does not include homicidal or suicidal ideation. Thought content does not include homicidal or suicidal plan.      ED Treatments / Results  Labs (all labs ordered are listed, but only abnormal results are displayed) Labs Reviewed  COMPREHENSIVE METABOLIC PANEL - Abnormal; Notable for the following components:      Result Value   Potassium 3.3 (*)    Glucose, Bld 122 (*)    BUN 5 (*)    AST 42 (*)    ALT 50 (*)    All other components within normal limits  ETHANOL  CBC  RAPID URINE DRUG SCREEN, HOSP PERFORMED  I-STAT BETA HCG BLOOD, ED ([facility], [facility], [facility] ONLY)    EKG None  Radiology Ct Head Wo Contrast  Result Date: 05/17/2019 CLINICAL DATA:  Head trauma headache altered EXAM: CT HEAD WITHOUT CONTRAST TECHNIQUE: Contiguous axial images were obtained from the base of the skull through the vertex without intravenous contrast. COMPARISON:  None. FINDINGS: Brain: Mild motion degradation. No acute territorial infarction, hemorrhage or intracranial mass. Normal ventricle size Vascular: No hyperdense vessel or unexpected calcification. Skull: Normal. Negative for fracture or focal lesion. Sinuses/Orbits: No acute finding. Other: None. IMPRESSION: Negative non contrasted CT appearance of the brain Electronically Signed   By: Donavan Foil M.D.   On: 05/17/2019 17:05    Procedures Procedures (including critical  care time)  Medications Ordered in ED Medications - No data to display   Initial Impression / Assessment and Plan / ED Course  I have reviewed the triage vital signs and the nursing notes.  Pertinent labs & imaging results that were available during my care of the patient were reviewed by me and considered in my medical decision making (see chart for details).        The patient apparently left while I was evaluating another patient.  The nurse informed me that she had left.  I offered the patient laboratory testing and conversation with TTS.   Final Clinical Impressions(s) / ED Diagnoses   Final diagnoses:  None    ED Discharge Orders    None       Dalia Heading, [place] 05/19/19 [id number]

## 2019-05-19 NOTE — Discharge Instructions (Signed)
Please use the attached list of outpatient resources for counseling.  Please follow recommendations of the behavioral health provider that you discussed your symptoms with over TeleMed.  Your blood pressure was elevated today please up with your primary care doctor blood pressure management.  Please use Vistaril up to 3 times daily for anxiety I have prescribed it this way.

## 2019-05-19 NOTE — ED Notes (Signed)
Pt belongings placed in locker #8 in purple zone.

## 2019-05-19 NOTE — ED Notes (Signed)
Asked pt for urine specimen. Pt talking in low voice about a granddaughter and looking around. Asked pt if she had to urinate and pt began talking incoherently.

## 2019-05-19 NOTE — ED Notes (Signed)
Pt changing into burgundy scrubs.  Family member at bedside to answer questions.  Labs obtained this morning at prior visit.

## 2019-05-19 NOTE — ED Triage Notes (Signed)
Pt unable to share what she needs but Pt is crying . Pt has said she does not want to go home and she does not have her keys. When asked Pt denies SI.

## 2019-05-19 NOTE — ED Notes (Signed)
Patient verbalizes understanding of discharge instructions . Opportunity for questions and answers were provided . Armband removed by staff ,Pt discharged from ED. W/C  offered at D/C  and Declined W/C at D/C and was escorted to lobby by RN.  

## 2019-05-19 NOTE — Discharge Summary (Signed)
DISCHARGE SUMMARY Admitted - 05/17/2019 Signed Against Medical advice and left on 05/18/2019.  As per H and P "This 38 year old lady presented to the emergency room primarily with a complaint of headache.  She reported that she had fallen and injured her head a week ago.  No history of loss of consciousness was forthcoming.  Patient is chronic heavy alcohol user and recently been more confused than usual. She had no chest pain on presentation before.  No significant abdominal pain was noted.  She denied nausea vomiting or diarrhea.  No dyspnea was noted at rest the patient.  My examination the patient is answering questions appropriately but is not fully oriented to time she is oriented to place and person however"  I did not see patient.  Patient signed against medical advice and left the hospital.

## 2019-05-19 NOTE — ED Provider Notes (Addendum)
[facility] EMERGENCY DEPARTMENT Provider Note   CSN: [id number] Arrival date & time: 05/19/19  1032     History   Chief Complaint Chief Complaint  Patient presents with  . Medical Clearance  . wandering  . Anxiety  . Hallucinations    HPI Andrea Stone is a 38 y.o. female.     HPI  Patient presents with niece at bedside and sister on speaker phone for collateral.  Per niece, patient drinks beer almost constantly.  Is much as a 36 pack a day at times.  Patient states that she has not drank since Friday.  Denies any current tremors, nausea, sweats, tactile disturbances, visual disturbances or headache.  Does endorse anxiety and auditory hallucinations.  She states that her voices that she hears are generally positive however her niece states that patient has been claiming that people are out to get her and hurt her.  She has been paranoid and anxious for the past week.  The symptoms began after patient was found to have hit her head in the bathroom when she was extremely drunk 8 days ago.  Patient was found unconscious in the bathroom.  Was arousable but has been less conversational since with progressive worsening of paranoid symptoms with family since then as she has been too paranoid to sleep in her house.  While at her family's house she has been leaving walking around at night looking in car is anxious that the below are following her.  Niece states that she has been calling lots of people and asking if she did something wrong over the past week.  Patient has no diagnosed history of anxiety depression.  Is on no psychologic therapy.  Seen at [facility] earlier this morning but left and he states that she was found at [facility] making phone calls to friends from their phone.  Niece and patient sister brought her back to ED today.  Patient unable to recall if she was seen earlier today.  And is unable to answer why she left.  Past Medical History:   Diagnosis Date  . Anemia 03/2018  . Anemia 02/2019  . Hyperlipidemia   . Hypertension   . Iron deficiency anemia 03/2018  . Trichomoniasis   . UTI (urinary tract infection) during pregnancy   . Vitamin D deficiency 02/2019    Patient Active Problem List   Diagnosis Date Noted  . Alcohol withdrawal delirium (HCC) 05/17/2019  . Back pain 05/01/2019  . Anemia 03/12/2019  . Iron deficiency anemia 03/12/2019  . Neuropathy 03/12/2019  . Fatigue 03/12/2019  . Essential hypertension 11/19/2018  . Sore throat (viral) 07/18/2018  . Common cold 07/18/2018  . Hyperlipidemia with target LDL less than 100 04/19/2018  . Low back pain with bilateral sciatica 03/29/2018  . DOE (dyspnea on exertion) 07/28/2017  . Chest pain with low risk for cardiac etiology 07/28/2017  . Family history of premature coronary heart disease 07/28/2017    Past Surgical History:  Procedure Laterality Date  . CARDIOPULMONARY EXERCISE TEST  07/2017    No regional WMA. No valvular lesions. cardiopulmonary limitation seen. Peak VO2 normal for workload achieved on submaximal test. Dyspnea most likely related to deconditioning & limitations due to weight. Normal Study from a Heart & Lung standpoint.  Marland Kitchen LAPAROSCOPIC TUBAL LIGATION Bilateral 02/06/2013   Procedure: LAPAROSCOPIC TUBAL LIGATION;  Surgeon: Kathreen Cosier, MD;  Location: [facility];  Service: Gynecology;  Laterality: Bilateral;  . TRANSTHORACIC ECHOCARDIOGRAM  07/2017  EF 60-65%.  No regional WMA. No valvular lesions.  . WISDOM TOOTH EXTRACTION       OB History    Gravida  3   Para  3   Term  3   Preterm  0   AB  0   Living  3     SAB  0   TAB  0   Ectopic  0   Multiple  0   Live Births  3            Home Medications    Prior to Admission medications   Medication Sig Start Date End Date Taking? Authorizing Provider  aspirin 81 MG EC tablet TAKE 1 TABLET BY MOUTH EVERY DAY Patient not taking: Reported on 11/19/2018 10/04/17    Quentin Angst, MD  diclofenac sodium (VOLTAREN) 1 % GEL Apply 4 g topically 4 (four) times daily. Patient not taking: Reported on 11/19/2018 08/31/18   Kallie Locks, FNP  ferrous sulfate 325 (65 FE) MG tablet TAKE 1 TABLET BY MOUTH EVERY DAY Patient taking differently: Take 325 mg by mouth daily with breakfast.  04/12/19   Kallie Locks, FNP  gabapentin (NEURONTIN) 300 MG capsule Take 2 capsules (Total = 600 mg), 2 times a day. Patient taking differently: Take 600 mg by mouth 2 (two) times daily.  04/30/19   Kallie Locks, FNP  hydrOXYzine (ATARAX/VISTARIL) 25 MG tablet Take 1 tablet (25 mg total) by mouth 3 (three) times daily for 7 days. 05/19/19 05/26/19  Gailen Shelter, PA  ibuprofen (ADVIL,MOTRIN) 200 MG tablet Take 200 mg by mouth every 6 (six) hours as needed.    [provider]  methocarbamol (ROBAXIN) 500 MG tablet Take 1 tablet (500 mg total) by mouth 2 (two) times daily as needed for muscle spasms. 12/26/18   Kallie Locks, FNP  pravastatin (PRAVACHOL) 80 MG tablet Take 1 tablet (80 mg total) by mouth daily. 04/19/18   Marykay Lex, MD  sulfamethoxazole-trimethoprim (BACTRIM DS) 800-160 MG tablet Take 1 tablet by mouth 2 (two) times daily. Patient not taking: Reported on 05/17/2019 04/30/19   Kallie Locks, FNP  topiramate (TOPAMAX) 25 MG tablet Take 1 tablet (25 mg total) by mouth 2 (two) times daily. 03/28/18   Kallie Locks, FNP  Vitamin D, Ergocalciferol, (DRISDOL) 1.25 MG (50000 UT) CAPS capsule TAKE 1 CAPSULE (50,000 UNITS TOTAL) BY MOUTH EVERY 7 (SEVEN) DAYS. Patient not taking: Reported on 04/30/2019 04/24/19   Kallie Locks, FNP    Family History Family History  Problem Relation Age of Onset  . Kidney disease Maternal Grandmother   . Heart failure Maternal Grandmother   . Hypertension Mother   . Asthma Sister   . Coronary artery disease Father        CABG - in 58s.  . Anesthesia problems Neg Hx   . Other Neg Hx     Social  History Social History   Tobacco Use  . Smoking status: Current Some Day Smoker    Packs/day: 0.10    Years: 13.00    Pack years: 1.30    Types: Cigarettes    Last attempt to quit: 05/27/2017    Years since quitting: 1.9  . Smokeless tobacco: Never Used  Substance Use Topics  . Alcohol use: Yes    Comment: socially  . Drug use: No     Allergies   Zantac [ranitidine hcl]   Review of Systems Review of Systems  Constitutional: Negative  for chills and fever.  HENT: Negative for congestion.   Eyes: Negative for pain.  Cardiovascular: Negative for chest pain.  Gastrointestinal: Negative for vomiting.  Genitourinary: Negative for dysuria.  Musculoskeletal: Negative for myalgias.  Skin: Negative for rash.  Neurological: Negative for dizziness and headaches.  Psychiatric/Behavioral: Positive for agitation, behavioral problems, confusion and decreased concentration. The patient is nervous/anxious.      Physical Exam Updated Vital Signs BP (!) 133/108 (BP Location: Right Arm)   Pulse (!) 101   Temp 98.2 F (36.8 C) (Oral)   Resp 16   SpO2 100%   Physical Exam Vitals signs and nursing note reviewed.  Constitutional:      General: She is not in acute distress.    Appearance: Normal appearance. She is not ill-appearing.  HENT:     Head: Normocephalic and atraumatic.  Eyes:     General: No scleral icterus.       Right eye: No discharge.        Left eye: No discharge.     Conjunctiva/sclera: Conjunctivae normal.  Pulmonary:     Effort: Pulmonary effort is normal.     Breath sounds: No stridor.  Neurological:     Mental Status: She is alert and oriented to person, place, and time. Mental status is at baseline.     Comments: Patient is ANO x2 unable to state date but is aware that it is December.  Patient is slow to answer questions has difficulty articulating answers.  No difficulty with word finding but requires multiple times to redirect.  Strength 5/5 in  upper/lower extremities  Sensation intact in upper/lower extremities   Normal gait.  Negative Romberg. No pronator drift.  Normal finger-to-nose and feet tapping.  CN I not tested  CN II grossly intact visual fields bilaterally. Did not visualize posterior eye.   CN III, IV, VI PERRLA and EOMs intact bilaterally  CN V Intact sensation to sharp and light touch to the face  CN VII facial movements symmetric  CN VIII not tested  CN IX, X no uvula deviation, symmetric rise of soft palate  CN XI 5/5 SCM and trapezius strength bilaterally  CN XII Midline tongue protrusion, symmetric L/R movements       ED Treatments / Results  Labs (all labs ordered are listed, but only abnormal results are displayed) Labs Reviewed  SARS CORONAVIRUS 2 (TAT 6-24 HRS)    CMP Latest Ref Rng & Units 05/19/2019 05/18/2019 05/17/2019  Glucose 70 - 99 mg/dL 122(H) 103(H) 120(H)  BUN 6 - 20 mg/dL 5(L) 9 7  Creatinine 0.44 - 1.00 mg/dL 0.82 0.85 0.82  Sodium 135 - 145 mmol/L 136 139 137  Potassium 3.5 - 5.1 mmol/L 3.3(L) 3.8 3.9  Chloride 98 - 111 mmol/L 100 101 100  CO2 22 - 32 mmol/L 24 27 20(L)  Calcium 8.9 - 10.3 mg/dL 9.5 9.5 9.4  Total Protein 6.5 - 8.1 g/dL 7.5 7.6 8.3(H)  Total Bilirubin 0.3 - 1.2 mg/dL 0.3 0.6 0.8  Alkaline Phos 38 - 126 U/L 65 64 73  AST 15 - 41 U/L 42(H) 57(H) 77(H)  ALT 0 - 44 U/L 50(H) 56(H) 64(H)   CBC    Component Value Date/Time   WBC 6.2 05/19/2019 0345   RBC 4.40 05/19/2019 0345   HGB 13.4 05/19/2019 0345   HGB 12.7 03/11/2019 0845   HCT 40.0 05/19/2019 0345   HCT 38.3 03/11/2019 0845   PLT 352 05/19/2019 0345   PLT 338 03/11/2019  0845   MCV 90.9 05/19/2019 0345   MCV 90 03/11/2019 0845   MCH 30.5 05/19/2019 0345   MCHC 33.5 05/19/2019 0345   RDW 14.0 05/19/2019 0345   RDW 12.7 03/11/2019 0845   LYMPHSABS 1.8 05/17/2019 1627   LYMPHSABS 2.5 03/11/2019 0845   MONOABS 0.8 05/17/2019 1627   EOSABS 0.0 05/17/2019 1627   EOSABS 0.1 03/11/2019 0845   BASOSABS  0.0 05/17/2019 1627   BASOSABS 0.0 03/11/2019 0845      EKG None  Radiology Ct Head Wo Contrast  Result Date: 05/17/2019 CLINICAL DATA:  Head trauma headache altered EXAM: CT HEAD WITHOUT CONTRAST TECHNIQUE: Contiguous axial images were obtained from the base of the skull through the vertex without intravenous contrast. COMPARISON:  None. FINDINGS: Brain: Mild motion degradation. No acute territorial infarction, hemorrhage or intracranial mass. Normal ventricle size Vascular: No hyperdense vessel or unexpected calcification. Skull: Normal. Negative for fracture or focal lesion. Sinuses/Orbits: No acute finding. Other: None. IMPRESSION: Negative non contrasted CT appearance of the brain Electronically Signed   By: Jasmine PangKim  Fujinaga M.D.   On: 05/17/2019 17:05    Procedures Procedures (including critical care time)  Medications Ordered in ED Medications  LORazepam (ATIVAN) injection 0-4 mg ( Intravenous See Alternative 05/19/19 1327)    Or  LORazepam (ATIVAN) tablet 0-4 mg (1 mg Oral Given 05/19/19 1327)  LORazepam (ATIVAN) injection 0-4 mg (has no administration in time range)    Or  LORazepam (ATIVAN) tablet 0-4 mg (has no administration in time range)  thiamine (VITAMIN B-1) tablet 100 mg (100 mg Oral Given 05/19/19 1327)    Or  thiamine (B-1) injection 100 mg ( Intravenous See Alternative 05/19/19 1327)     Initial Impression / Assessment and Plan / ED Course  I have reviewed the triage vital signs and the nursing notes.  Pertinent labs & imaging results that were available during my care of the patient were reviewed by me and considered in my medical decision making (see chart for details).        Patient presents with paranoid delusions, confusion, agitation, anxiety, nervousness.  Per niece and sister of patient patient has been experiencing these symptoms since she hit her head 1 week ago.  However they became aware of the symptoms only 3 days ago.  Patient has been concerned  that people are following her and have bad intentions for her.  Also states she has auditory hallucinations that are generally encouraging however.   Patient has reassuring neurologic exam.  Patient had normal CT scan has had normal labs drawn today.  Appears she has chronically elevated AST ALT but unchanged from baseline.  Mild hypokalemia which appears to be causing her symptoms today.  Level is negative.  CBC without normality.  Pregnancy test negative.  Indication for further imaging or testing this time.  Patient is medically cleared at 1:05pm.   CTS consulted as patient has paranoid delusions and concerning changes in behavior   2:50 PM discussed with behavioral health nurse Hillery Jacksanika Lewis, NP who recommended discharge with Vistaril as patient does not meet inpatient criteria.  States the patient will follow up with outpatient resources.  I discussed this plan with patient and niece at bedside.  They are concerned but willing to try outpatient treatment.  I provided resource list in addition to behavioral health recommendations.  Discharge patient with Vistaril twice daily as needed for anxiety.  Patient seems less anxious during my reassessment and discussion of plan.  Continues to make  poor eye contact and withdrawn.  Pulse is 90 on my evaluation.  Patient's blood pressure is elevated and there are no blood pressure medications on her medication list.  Will recommend that she follow-up with a primary care doctor or establish with 1 for blood pressure management.  7 days of Vistaril sent to pharmacy for patient.  Because patient has a history of alcohol use.  Discussed with patient symptoms that should prompt her to return to ED for treatment of delirium tremens or acute alcohol withdrawal.  She and family arrived at bedside state understanding of these symptoms and seriousness of presentation.   This patient appears reasonably screened and I doubt any other medical condition requiring  further workup, evaluation, or treatment in the ED at this time prior to discharge.   Patient's vitals are WNL apart from vital sign abnormalities discussed above, patient is in NAD, and able to ambulate in the ED at their baseline. Pain has been managed or a plan has been made for home management and has no complaints prior to discharge. Patient is comfortable with above plan and is stable for discharge at this time. All questions were answered prior to disposition. Results from the ER workup discussed with the patient face to face and all questions answered to the best of my ability. The patient is safe for discharge with strict return precautions. Patient appears safe for discharge with appropriate follow-up. Conveyed my impression with the patient and they voiced understanding and are agreeable to plan.   An After Visit Summary was printed and given to the patient.  Portions of this note were generated with Scientist, clinical (histocompatibility and immunogenetics). Dictation errors may occur despite best attempts at proofreading.    Final Clinical Impressions(s) / ED Diagnoses   Final diagnoses:  Anxiety  Paranoid behavior [facility])    ED Discharge Orders         Ordered    hydrOXYzine (ATARAX/VISTARIL) 25 MG tablet  3 times daily     05/19/19 [address] [place], [place] 05/19/19 1452    Gailen Shelter, [place] 05/19/19 1453    Sabas Sous, MD 05/19/19 2024

## 2019-05-19 NOTE — ED Notes (Signed)
Patient still out in lobby. Tried to snatch a cell phone off of the EMS stretcher. Security called.

## 2019-05-19 NOTE — ED Triage Notes (Signed)
Pt reported she came to ED because she could not find her Keys or her wallet , Pt A/O x4 . Pt denies SI .

## 2019-05-19 NOTE — BHH Counselor (Signed)
  [place] ASSESSMENT DISPOSITION  [facility] discussed case with [place] Provider, Ricky Ala, NP who recommends the pt discharge with outpatient community [place] resources.  CSW faxed outpatient community [place] resources to [facility] to include in patient's discharge paperwork.  Reve Crocket L. [place], [place], [facility], [facility] Therapeutic Triage Specialist  [phone]

## 2019-05-19 NOTE — BH Assessment (Signed)
Tele Assessment Note   Patient Name: Andrea Stone MRN: [id number] Referring Physician: Dr. Kermit Balo. Renaye Rakers, MD Location of Patient: [facility] Location of Provider: Behavioral Health TTS Department  Andrea Stone is a 38 y.o. female who was voluntarily brought to [facility] to be evaluated due to anxiety and increased paranoia.  Pt was accompanied by her niece, Andrea Stone who was present during the assessment.  Pt states "I been having a lot of anxiety and panic attacks."  (Pt's niece encouraged patient to be honest and to answer the questions truthfully in order to get the help that she needs).  Pt admits to making suicidal statements.  Pt states, "I say I don't want to be here and I will leave.  I don't want to kill my self. I'm just scared.  I been paranoid and hearing voices and seeing things for the past week."  Pt denies suicide intentions or intentions to cause self harm. Pt admits to "drinking an unknown amount of beer daily last used 05/17/19; and socially smoking marijuana last used on 11/28."  Pt denies HI.     Pt reside with her children but is currently staying with her sister due to her mental state.  Pt is developmental delayed and receives disability income.  Pt's parents are her guardians.  Pt denies a history of inpatient/outpatient MH/SA treatment.  Pt denies a history of physical, sexual, and verbal, abuse.   Patient was wearing scrubs and appeared appropriately groomed.  Pt was somnolent throughout the assessment.  Patient made fair eye contact and had normal psychomotor activity.  Patient spoke in a normal voice without pressured speech.  Pt expressed feeling anxious.  Pt's affect appeared euthymic and incongruent with stated mood. Pt's thought process was incoherent.  Pt presented with partial insight and judgement.  Pt did not appear to be responding to internal stimuli.  Pt was able to contract for safety.  Family Collateral Andrea Stone, niece and Andrea Stone, sister [phone]).  According to pt's niece and sister, pt was at a birthday party on 05/11/2019 and she smoked some weed with some friends and hadn't been the same.  Pt can't remember anything.  Pt drink everyday but pt don't know how much she is drinking.  Pt is drinking a lot. Ever since that party pt has been paranoid.  Pt has been scared to go to the bathroom by herself always thinking something is going to get her.  Pt is walking around the neighborhood crazy doing things like she normally don't do.  Pt is developmentally delayed and her parents are her legal guardians and her sister, Andrea Stone is her payee but pt normally can take care of her kids.  Pt can't take care of her kids right now. Pt need some help.  Pt is staying with family until she gets better.  Disposition: [facility] discussed case with BH Provider, Hillery Jacks, NP who recommends the pt discharge with outpatient community MH resources.  CSW faxed outpatient community MH resources to [facility] to include in patient's discharge paperwork.   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 Anemia 03/2018  . Anemia 02/2019  . Hyperlipidemia   . Hypertension   . Iron deficiency anemia 03/2018  . Trichomoniasis   . UTI (urinary tract infection) during pregnancy   . Vitamin D deficiency 02/2019    Past Surgical History:  Procedure Laterality Date  . CARDIOPULMONARY EXERCISE TEST  07/2017    No regional WMA. No valvular lesions. cardiopulmonary limitation seen. Peak VO2 normal for workload achieved on submaximal test. Dyspnea most likely related to deconditioning & limitations due to weight. Normal Study from a Heart & Lung standpoint.  Marland Kitchen LAPAROSCOPIC TUBAL LIGATION Bilateral 02/06/2013   Procedure: LAPAROSCOPIC TUBAL LIGATION;  Surgeon: Frederico Hamman,  MD;  Location: [facility];  Service: Gynecology;  Laterality: Bilateral;  . TRANSTHORACIC ECHOCARDIOGRAM  07/2017   EF 60-65%.  No regional WMA. No valvular lesions.  . WISDOM TOOTH EXTRACTION      Family History:  Family History  Problem Relation Age of Onset  . Kidney disease Maternal Grandmother   . Heart failure Maternal Grandmother   . Hypertension Mother   . Asthma Sister   . Coronary artery disease Father        CABG - in 52s.  . Anesthesia problems Neg Hx   . Other Neg Hx     Social History:  reports that she has been smoking cigarettes. She has a 1.30 pack-year smoking history. She has never used smokeless tobacco. She reports current alcohol use. She reports that she does not use drugs.  Additional Social History:  Alcohol / Drug Use Pain Medications: See MARs Prescriptions: See MARs Over the Counter: See MAR History of alcohol / drug use?: Yes Longest period of sobriety (when/how long): few days Substance #1 Name of Substance 1: Alcohol 1 - Age of First Use: unknown 1 - Amount (size/oz): unknown 1 - Frequency: daily 1 - Duration: ongoing 1 - Last Use / Amount: 05/17/2019 unknown Substance #2 Name of Substance 2: cannabis 2 - Age of First Use: unknown 2 - Amount (size/oz): unknown 2 - Frequency: socially 2 - Duration: ongoing 2 - Last Use / Amount: 05/11/2019, unknown  CIWA: CIWA-Ar BP: (!) 133/108 Pulse Rate: (!) 101 Nausea and Vomiting: no nausea and no vomiting Tactile Disturbances: very mild itching, pins and needles, burning or numbness Tremor: no tremor Auditory Disturbances: very mild harshness or ability to frighten Paroxysmal Sweats: no sweat visible Visual Disturbances: very mild sensitivity Anxiety: three Headache, Fullness in Head: none present Agitation: normal activity Orientation and Clouding of Sensorium: oriented and can do serial additions CIWA-Ar Total: 6 COWS:    Allergies:  Allergies  Allergen Reactions  . Zantac [Ranitidine Hcl]  Swelling and Rash    Home Medications: (Not in a hospital admission)   OB/GYN Status:  No LMP recorded.  General Assessment Data Location of Assessment: [facility] ED TTS Assessment: In system Is this a Tele or Face-to-Face Assessment?: Tele Assessment Is this an Initial Assessment or a Re-assessment for this encounter?: Initial Assessment Patient Accompanied by:: Tad Moore, Niece and Samuel Jester via cell phone) Language Other than English: No Living Arrangements: Other (Comment)(live alone with 3 kids) What gender do you identify as?: Female Marital status: Single Pregnancy Status: Unknown Living Arrangements: Children(3 children) Can pt return to current living arrangement?: Yes Admission Status: Voluntary Is patient capable of signing voluntary admission?: Yes Referral Source: Self/Family/Friend     Crisis Care Plan Living Arrangements: Children(3 children) Legal Guardian: Mother, Father(Diane Sallee Provencal and Otho Darner)  Name of Psychiatrist: NA Name of Therapist: NA  Education Status Is patient currently in school?: No Is the patient employed, unemployed or receiving disability?: Receiving disability income  Risk to self with the past 6 months Suicidal Ideation: Yes-Currently Present Has patient been a risk to self within the past 6 months prior to admission? : No Suicidal Intent: Yes-Currently Present Has patient had any suicidal intent within the past 6 months prior to admission? : No Is patient at risk for suicide?: Yes Suicidal Plan?: No Has patient had any suicidal plan within the past 6 months prior to admission? : No Access to Means: No What has been your use of drugs/alcohol within the last 12 months?: Alcohol and Cannabis Previous Attempts/Gestures: No Triggers for Past Attempts: None known Intentional Self Injurious Behavior: None Family Suicide History: No Recent stressful life event(s): Other (Comment)(parnoia) Persecutory voices/beliefs?:  Yes Depression: No Depression Symptoms: Insomnia Substance abuse history and/or treatment for substance abuse?: No Suicide prevention information given to non-admitted patients: Not applicable  Risk to Others within the past 6 months Homicidal Ideation: No Does patient have any lifetime risk of violence toward others beyond the six months prior to admission? : No Thoughts of Harm to Others: No Current Homicidal Intent: No Current Homicidal Plan: No Access to Homicidal Means: No History of harm to others?: No Assessment of Violence: None Noted Does patient have access to weapons?: No Criminal Charges Pending?: No Does patient have a court date: No Is patient on probation?: No  Psychosis Hallucinations: Auditory, Visual, With command Delusions: None noted  Mental Status Report Appearance/Hygiene: In scrubs Eye Contact: Fair Motor Activity: Freedom of movement Speech: Incoherent Mood: Anxious, Suspicious Affect: Flat Anxiety Level: Minimal Thought Processes: Unable to Assess Judgement: Partial Orientation: Person, Place Obsessive Compulsive Thoughts/Behaviors: None  Cognitive Functioning Concentration: Decreased Memory: Recent Impaired, Remote Impaired Is patient IDD: Yes Level of Function: (unknown ) Is IQ score available?: No Insight: Poor Impulse Control: Poor Appetite: Poor(won't eat) Have you had any weight changes? : No Change Sleep: Decreased Total Hours of Sleep: 0(no sleep since 11/28) Vegetative Symptoms: None  ADLScreening [facility] Assessment Services) Patient's cognitive ability adequate to safely complete daily activities?: Yes Patient able to express need for assistance with ADLs?: Yes Independently performs ADLs?: Yes (appropriate for developmental age)  Prior Inpatient Therapy Prior Inpatient Therapy: No  Prior Outpatient Therapy Prior Outpatient Therapy: No Does patient have an ACCT team?: No Does patient have Intensive In-House Services?  :  No Does patient have [facility] services? : No Does patient have [facility] services?: No  ADL Screening (condition at time of admission) Patient's cognitive ability adequate to safely complete daily activities?: Yes Is the patient deaf or have difficulty hearing?: No Does the patient have difficulty seeing, even when wearing glasses/contacts?: No Does the patient have difficulty concentrating, remembering, or making decisions?: Yes Patient able to express need for assistance with ADLs?: Yes Does the patient have difficulty dressing or bathing?: No Independently performs ADLs?: Yes (appropriate for developmental age) Does the patient have difficulty walking or climbing stairs?: No Weakness of Legs: None Weakness of Arms/Hands: None  Home Assistive Devices/Equipment Home Assistive Devices/Equipment: None    Abuse/Neglect Assessment (Assessment to be complete while patient is alone) Abuse/Neglect Assessment Can Be Completed: Yes Physical Abuse: Denies Verbal Abuse: Denies Sexual Abuse: Denies Exploitation of patient/patient's resources: Denies     Merchant navy officerAdvance Directives (For Healthcare) Does Patient Have a Medical Advance Directive?: No Would patient like information on creating a medical advance  directive?: No - Patient declined          Disposition: [facility] discussed case with BH Provider, Hillery Jacks, NP who recommends the pt discharge with outpatient community MH resources.  CSW faxed outpatient community MH resources to [facility] to include in patient's discharge paperwork.  Disposition Initial Assessment Completed for this Encounter: Yes Disposition of Patient: Discharge(Per Hillery Jacks, NP) Patient refused recommended treatment: No Mode of transportation if patient is discharged/movement?: Car Patient referred to: Outpatient clinic referral([facility] or [facility])  This service was provided via telemedicine using a 2-way, interactive audio and video technology.  Names of all persons  participating in this telemedicine service and their role in this encounter. Name: Kristyna Bradstreet Role: Patient  Name: Andrea Stone Role: Niece  Name: Tyron Russell, MS, [facility] Advocate, [facility] Role: Triage Specialist  Name: Hillery Jacks, NP Role: [facility] Provider    Phenix Vandermeulen Thayer [place], MS, [facility], [facility] 05/19/2019 2:47 PM

## 2019-05-19 NOTE — ED Triage Notes (Signed)
Family reports pt has been wandering around for 1 week and hitting herself in the head.  Pt alert, ambulatory.  Not answering questions at this time but shakes her head no when asked if she is suicidal.  Family believes pt is having auditory hallucinations.  Seen in ED this morning for anxiety and left prior to discharge.

## 2019-05-19 NOTE — ED Triage Notes (Signed)
PT reports she wants to go home . Vital signs checked . Pt denies SI. Pt walked out of ED to go home.Pt A/O x4.

## 2019-05-19 NOTE — ED Triage Notes (Signed)
Patient reports panic attacks /anxiety for several days , denies hallucinations / no suicidal ideations .

## 2019-05-19 NOTE — ED Notes (Signed)
Behavioral Health on monitor at bedside

## 2019-05-20 ENCOUNTER — Ambulatory Visit: Payer: Medicaid Other | Admitting: Family Medicine

## 2019-07-01 IMAGING — CR DG CHEST 2V
2 series · 2 of 2 positions shown · non-contrast
Comparison: 04/15/2017.

CLINICAL DATA: Chest heaviness and pain for 1 week.  No cough.

EXAM:
CHEST  2 VIEW

[chest pa]
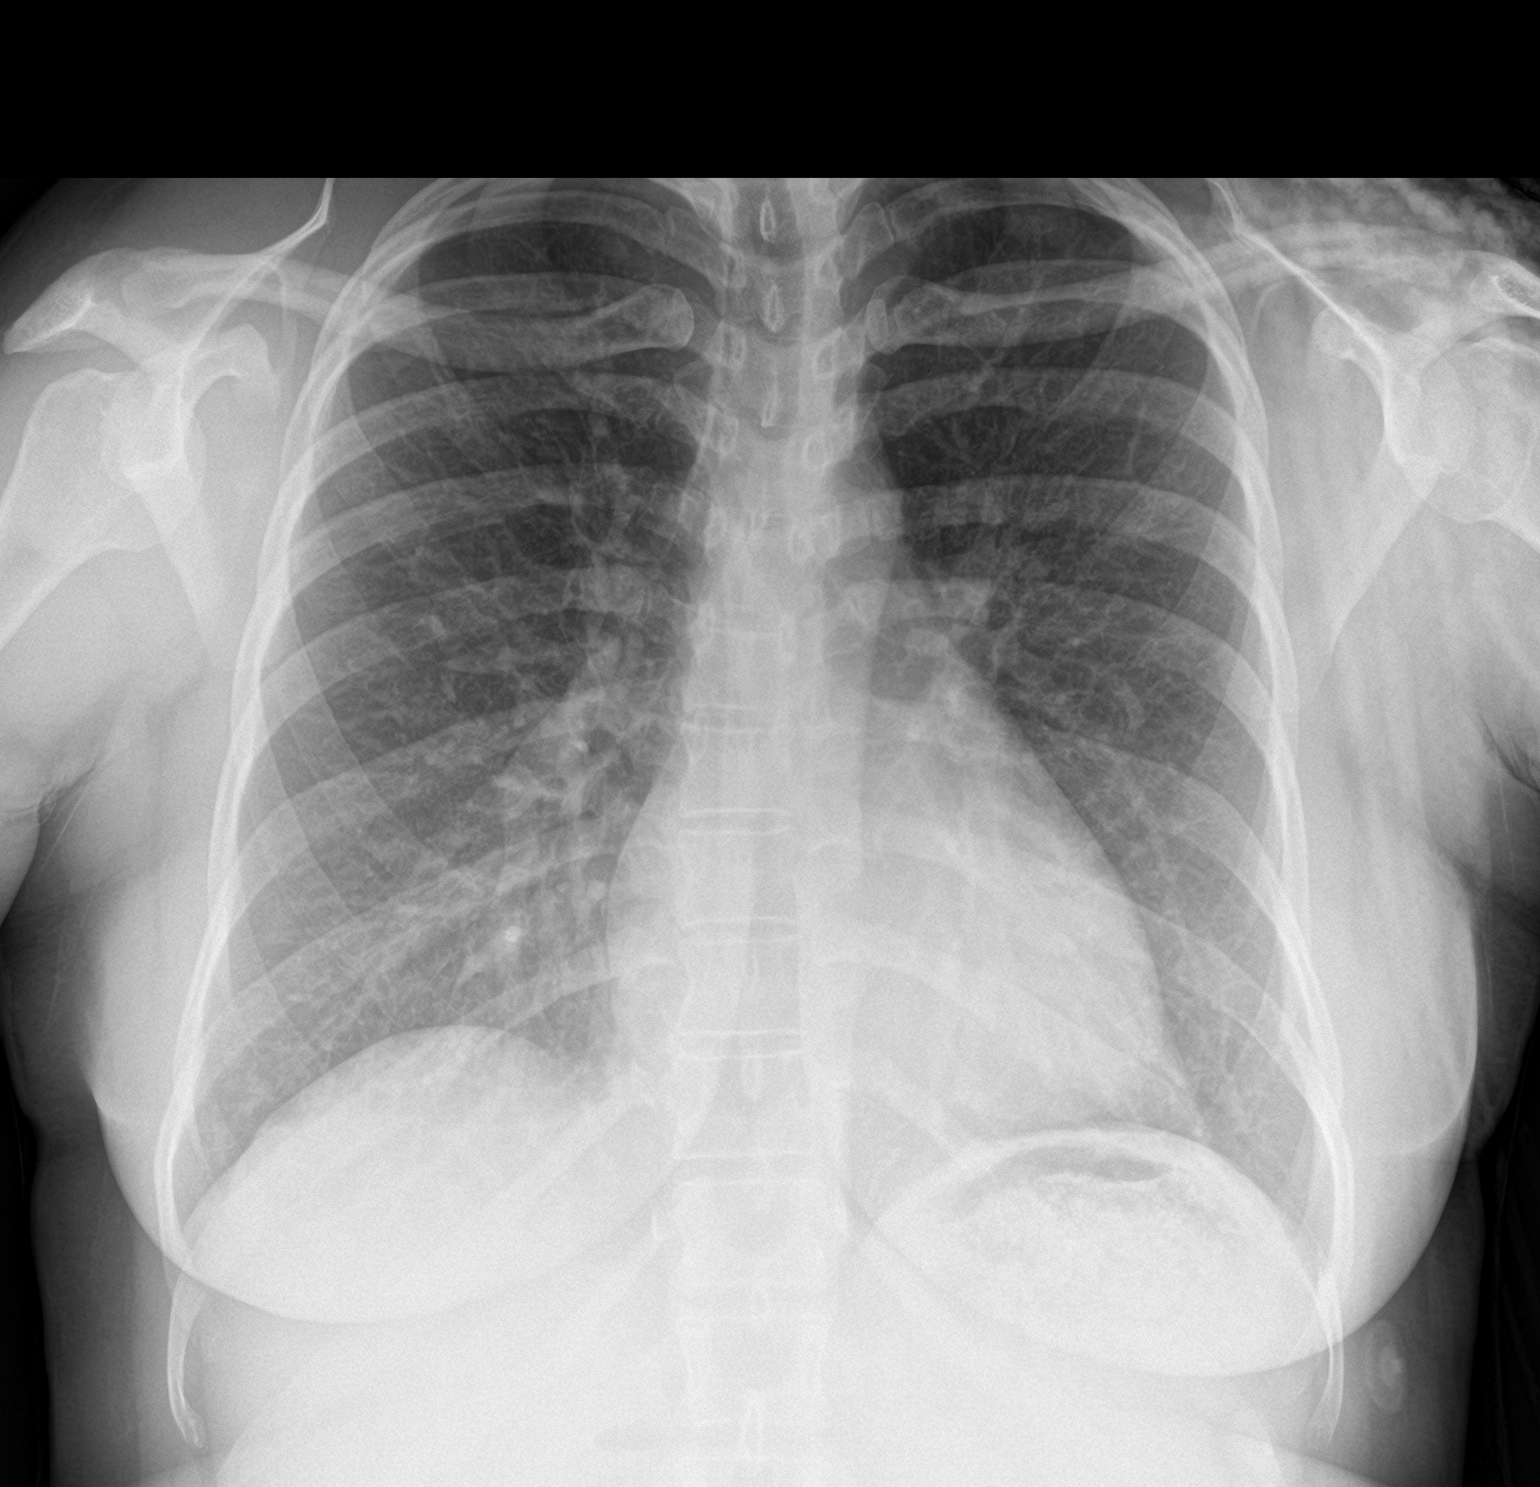

[chest lat]
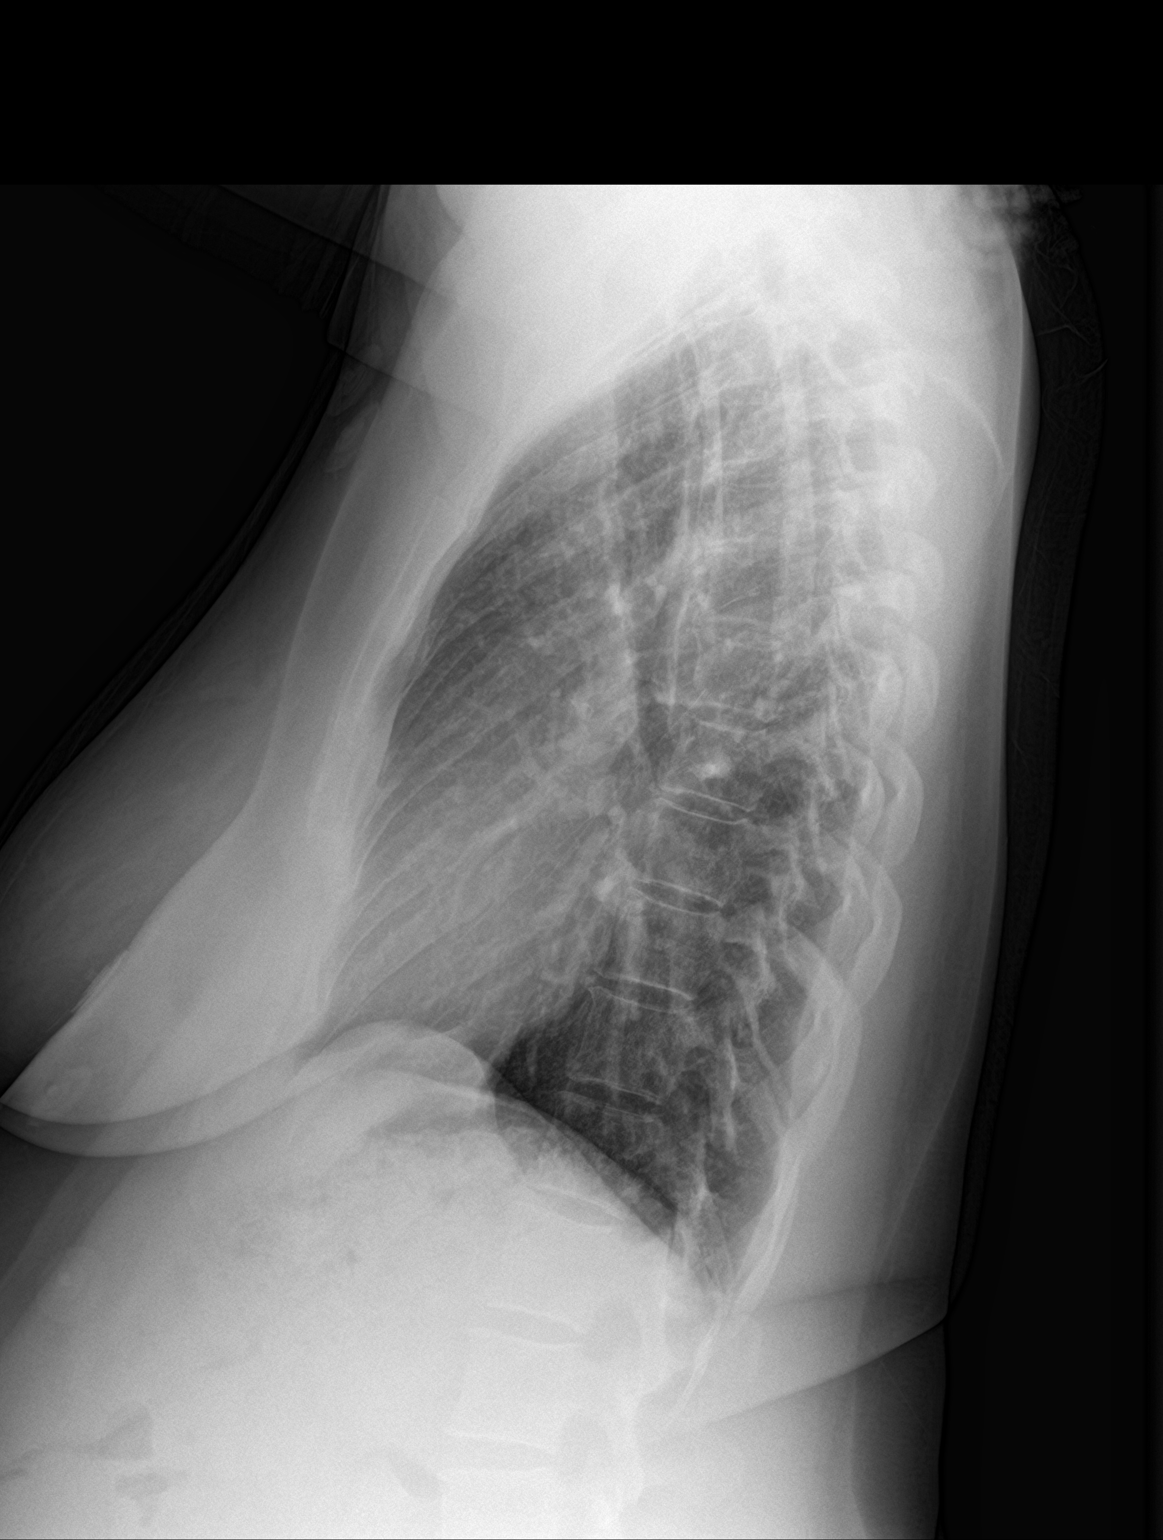

[2 of 2 positions shown; findings below may reference images not displayed]

FINDINGS: The heart size and mediastinal contours are within normal limits.
Both lungs are clear. The visualized skeletal structures are
unremarkable.
IMPRESSION: No active cardiopulmonary disease.

## 2019-07-08 ENCOUNTER — Ambulatory Visit: Payer: Medicaid Other | Admitting: Family Medicine

## 2019-07-12 ENCOUNTER — Ambulatory Visit: Payer: Medicaid Other | Admitting: Family Medicine

## 2019-07-15 DIAGNOSIS — K219 Gastro-esophageal reflux disease without esophagitis: Secondary | ICD-10-CM

## 2019-07-15 HISTORY — DX: Gastro-esophageal reflux disease without esophagitis: K21.9

## 2019-07-17 ENCOUNTER — Telehealth: Payer: Self-pay | Admitting: Family Medicine

## 2019-07-18 ENCOUNTER — Other Ambulatory Visit: Payer: Self-pay

## 2019-07-18 DIAGNOSIS — E559 Vitamin D deficiency, unspecified: Secondary | ICD-10-CM

## 2019-07-18 DIAGNOSIS — M549 Dorsalgia, unspecified: Secondary | ICD-10-CM

## 2019-07-18 MED ORDER — METHOCARBAMOL 500 MG PO TABS: 500.0000 mg | ORAL_TABLET | Freq: Two times a day (BID) | ORAL | 2 refills | Status: DC | PRN

## 2019-07-18 MED ORDER — VITAMIN D (ERGOCALCIFEROL) 1.25 MG (50000 UNIT) PO CAPS: 50000.0000 [IU] | ORAL_CAPSULE | ORAL | 1 refills | Status: DC

## 2019-07-19 NOTE — Telephone Encounter (Signed)
Sent to NP 

## 2019-07-24 ENCOUNTER — Encounter: Payer: Self-pay | Admitting: Family Medicine

## 2019-07-24 ENCOUNTER — Ambulatory Visit (INDEPENDENT_AMBULATORY_CARE_PROVIDER_SITE_OTHER): Payer: Medicaid Other | Admitting: Family Medicine

## 2019-07-24 ENCOUNTER — Other Ambulatory Visit: Payer: Self-pay

## 2019-07-24 VITALS — BP 120/84 | HR 93 | Temp 98.4°F | Ht 64.0 in | Wt 166.2 lb

## 2019-07-24 DIAGNOSIS — Z09 Encounter for follow-up examination after completed treatment for conditions other than malignant neoplasm: Secondary | ICD-10-CM

## 2019-07-24 DIAGNOSIS — I10 Essential (primary) hypertension: Secondary | ICD-10-CM | POA: Diagnosis not present

## 2019-07-24 DIAGNOSIS — G629 Polyneuropathy, unspecified: Secondary | ICD-10-CM | POA: Diagnosis not present

## 2019-07-24 DIAGNOSIS — M79672 Pain in left foot: Secondary | ICD-10-CM

## 2019-07-24 DIAGNOSIS — M79671 Pain in right foot: Secondary | ICD-10-CM | POA: Insufficient documentation

## 2019-07-24 DIAGNOSIS — F419 Anxiety disorder, unspecified: Secondary | ICD-10-CM | POA: Diagnosis not present

## 2019-07-24 DIAGNOSIS — Z309 Encounter for contraceptive management, unspecified: Secondary | ICD-10-CM

## 2019-07-24 DIAGNOSIS — K219 Gastro-esophageal reflux disease without esophagitis: Secondary | ICD-10-CM | POA: Diagnosis not present

## 2019-07-24 LAB — POCT URINALYSIS DIPSTICK
Bilirubin, UA: NEGATIVE
Blood, UA: NEGATIVE
Glucose, UA: NEGATIVE
Ketones, UA: NEGATIVE
Nitrite, UA: NEGATIVE
Protein, UA: NEGATIVE
Spec Grav, UA: >=1.03 — AB (ref 1.010–1.025)
Urobilinogen, UA: 0.2 E.U./dL
pH, UA: 6 (ref 5.0–8.0)

## 2019-07-24 MED ORDER — PREGABALIN 50 MG PO CAPS: 50.0000 mg | ORAL_CAPSULE | Freq: Every day | ORAL | 1 refills | Status: DC

## 2019-07-24 MED ORDER — NORETHINDRONE ACET-ETHINYL EST 1-20 MG-MCG PO TABS: 1.0000 | ORAL_TABLET | Freq: Every day | ORAL | 11 refills | Status: DC

## 2019-07-24 NOTE — Patient Instructions (Signed)
Generalized Anxiety Disorder, Adult Generalized anxiety disorder (GAD) is a mental health disorder. People with this condition constantly worry about everyday events. Unlike normal anxiety, worry related to GAD is not triggered by a specific event. These worries also do not fade or get better with time. GAD interferes with life functions, including relationships, work, and school. GAD can vary from mild to severe. People with severe GAD can have intense waves of anxiety with physical symptoms (panic attacks). What are the causes? The exact cause of GAD is not known. What increases the risk? This condition is more likely to develop in:  Women.  People who have a family history of anxiety disorders.  People who are very shy.  People who experience very stressful life events, such as the death of a loved one.  People who have a very stressful family environment. What are the signs or symptoms? People with GAD often worry excessively about many things in their lives, such as their health and family. They may also be overly concerned about:  Doing well at work.  Being on time.  Natural disasters.  Friendships. Physical symptoms of GAD include:  Fatigue.  Muscle tension or having muscle twitches.  Trembling or feeling shaky.  Being easily startled.  Feeling like your heart is pounding or racing.  Feeling out of breath or like you cannot take a deep breath.  Having trouble falling asleep or staying asleep.  Sweating.  Nausea, diarrhea, or irritable bowel syndrome (IBS).  Headaches.  Trouble concentrating or remembering facts.  Restlessness.  Irritability. How is this diagnosed? Your health care provider can diagnose GAD based on your symptoms and medical history. You will also have a physical exam. The health care provider will ask specific questions about your symptoms, including how severe they are, when they started, and if they come and go. Your health care  provider may ask you about your use of alcohol or drugs, including prescription medicines. Your health care provider may refer you to a mental health specialist for further evaluation. Your health care provider will do a thorough examination and may perform additional tests to rule out other possible causes of your symptoms. To be diagnosed with GAD, a person must have anxiety that:  Is out of his or her control.  Affects several different aspects of his or her life, such as work and relationships.  Causes distress that makes him or her unable to take part in normal activities.  Includes at least three physical symptoms of GAD, such as restlessness, fatigue, trouble concentrating, irritability, muscle tension, or sleep problems. Before your health care provider can confirm a diagnosis of GAD, these symptoms must be present more days than they are not, and they must last for six months or longer. How is this treated? The following therapies are usually used to treat GAD:  Medicine. Antidepressant medicine is usually prescribed for long-term daily control. Antianxiety medicines may be added in severe cases, especially when panic attacks occur.  Talk therapy (psychotherapy). Certain types of talk therapy can be helpful in treating GAD by providing support, education, and guidance. Options include: ? Cognitive behavioral therapy (CBT). People learn coping skills and techniques to ease their anxiety. They learn to identify unrealistic or negative thoughts and behaviors and to replace them with positive ones. ? Acceptance and commitment therapy (ACT). This treatment teaches people how to be mindful as a way to cope with unwanted thoughts and feelings. ? Biofeedback. This process trains you to manage your body's response (  physiological response) through breathing techniques and relaxation methods. You will work with a therapist while machines are used to monitor your physical symptoms.  Stress  management techniques. These include yoga, meditation, and exercise. A mental health specialist can help determine which treatment is best for you. Some people see improvement with one type of therapy. However, other people require a combination of therapies. Follow these instructions at home:  Take over-the-counter and prescription medicines only as told by your health care provider.  Try to maintain a normal routine.  Try to anticipate stressful situations and allow extra time to manage them.  Practice any stress management or self-calming techniques as taught by your health care provider.  Do not punish yourself for setbacks or for not making progress.  Try to recognize your accomplishments, even if they are small.  Keep all follow-up visits as told by your health care provider. This is important. Contact a health care provider if:  Your symptoms do not get better.  Your symptoms get worse.  You have signs of depression, such as: ? A persistently sad, cranky, or irritable mood. ? Loss of enjoyment in activities that used to bring you joy. ? Change in weight or eating. ? Changes in sleeping habits. ? Avoiding friends or family members. ? Loss of energy for normal tasks. ? Feelings of guilt or worthlessness. Get help right away if:  You have serious thoughts about hurting yourself or others. If you ever feel like you may hurt yourself or others, or have thoughts about taking your own life, get help right away. You can go to your nearest emergency department or call:  Your local emergency services (911 in the [place]  A suicide crisis helpline, such as the National Suicide Prevention Lifeline at (269)060-4268. This is open 24 hours a day. Summary  Generalized anxiety disorder (GAD) is a mental health disorder that involves worry that is not triggered by a specific event.  People with GAD often worry excessively about many things in their lives, such as their health and  family.  GAD may cause physical symptoms such as restlessness, trouble concentrating, sleep problems, frequent sweating, nausea, diarrhea, headaches, and trembling or muscle twitching.  A mental health specialist can help determine which treatment is best for you. Some people see improvement with one type of therapy. However, other people require a combination of therapies. This information is not intended to replace advice given to you by your health care provider. Make sure you discuss any questions you have with your health care provider. Document Revised: 05/12/2017 Document Reviewed: 04/19/2016 Elsevier Patient Education  2020 [employer] Pregabalin capsules What is this medicine? PREGABALIN (pre GAB a lin) is used to treat nerve pain from diabetes, shingles, spinal cord injury, and fibromyalgia. It is also used to control seizures in epilepsy. This medicine may be used for other purposes; ask your health care provider or pharmacist if you have questions. COMMON BRAND NAME(S): Lyrica What should I tell my health care provider before I take this medicine? They need to know if you have any of these conditions:  heart disease  history of drug abuse or alcohol abuse problem  kidney disease  lung or breathing disease  suicidal thoughts, plans, or attempt; a previous suicide attempt by you or a family member  an unusual or allergic reaction to pregabalin, gabapentin, other medicines, foods, dyes, or preservatives  pregnant or trying to get pregnant  breast-feeding How should I use this medicine? Take this medicine by mouth  with a glass of water. Follow the directions on the prescription label. You can take it with or without food. If it upsets your stomach, take it with food. Take your medicine at regular intervals. Do not take it more often than directed. Do not stop taking except on your doctor's advice. A special MedGuide will be given to you by the pharmacist with each  prescription and refill. Be sure to read this information carefully each time. Talk to your pediatrician regarding the use of this medicine in children. While this drug may be prescribed for children as young as 1 month for selected conditions, precautions do apply. Overdosage: If you think you have taken too much of this medicine contact a poison control center or emergency room at once. NOTE: This medicine is only for you. Do not share this medicine with others. What if I miss a dose? If you miss a dose, take it as soon as you can. If it is almost time for your next dose, take only that dose. Do not take double or extra doses. What may interact with this medicine? This medicine may interact with the following medications:  alcohol  antihistamines for allergy, cough, and cold  certain medicines for anxiety or sleep  certain medicines for depression like amitriptyline, fluoxetine, sertraline  certain medicines for diabetes  certain medicines for seizures like phenobarbital, primidone  general anesthetics like halothane, isoflurane, methoxyflurane, propofol  local anesthetics like lidocaine, pramoxine, tetracaine  medicines that relax muscles for surgery  narcotic medicines for pain  phenothiazines like chlorpromazine, mesoridazine, prochlorperazine, thioridazine This list may not describe all possible interactions. Give your health care provider a list of all the medicines, herbs, non-prescription drugs, or dietary supplements you use. Also tell them if you smoke, drink alcohol, or use illegal drugs. Some items may interact with your medicine. What should I watch for while using this medicine? Tell your doctor or healthcare professional if your symptoms do not start to get better or if they get worse. Visit your doctor or health care professional for regular checks on your progress. Do not stop taking except on your doctor's advice. You may develop a severe reaction. Your doctor will  tell you how much medicine to take. Wear a medical identification bracelet or chain if you are taking this medicine for seizures, and carry a card that describes your disease and details of your medicine and dosage times. You may get drowsy or dizzy. Do not drive, use machinery, or do anything that needs mental alertness until you know how this medicine affects you. Do not stand or sit up quickly, especially if you are an older patient. This reduces the risk of dizzy or fainting spells. Alcohol may interfere with the effect of this medicine. Avoid alcoholic drinks. If you have a heart condition, like congestive heart failure, and notice that you are retaining water and have swelling in your hands or feet, contact your health care provider immediately. The use of this medicine may increase the chance of suicidal thoughts or actions. Pay special attention to how you are responding while on this medicine. Any worsening of mood, or thoughts of suicide or dying should be reported to your health care professional right away. This medicine has caused reduced sperm counts in some men. This may interfere with the ability to father a child. You should talk to your doctor or health care professional if you are concerned about your fertility. Women who become pregnant while using this medicine for seizures may  enroll in the [place] Pregnancy Registry by calling 919-805-0328. This registry collects information about the safety of antiepileptic drug use during pregnancy. What side effects may I notice from receiving this medicine? Side effects that you should report to your doctor or health care professional as soon as possible:  allergic reactions like skin rash, itching or hives, swelling of the face, lips, or tongue  breathing problems  changes in vision  chest pain  confusion  jerking or unusual movements of any part of your body  loss of memory  muscle pain, tenderness, or  weakness  suicidal thoughts or other mood changes  swelling of the ankles, feet, hands  unusual bruising or bleeding Side effects that usually do not require medical attention (report to your doctor or health care professional if they continue or are bothersome):  dizziness  drowsiness  dry mouth  headache  nausea  tremors  trouble sleeping  weight gain This list may not describe all possible side effects. Call your doctor for medical advice about side effects. You may report side effects to FDA at 1-800-FDA-1088. Where should I keep my medicine? Keep out of the reach of children. This medicine can be abused. Keep your medicine in a safe place to protect it from theft. Do not share this medicine with anyone. Selling or giving away this medicine is dangerous and against the law. This medicine may cause accidental overdose and death if it taken by other adults, children, or pets. Mix any unused medicine with a substance like cat litter or coffee grounds. Then throw the medicine away in a sealed container like a sealed bag or a coffee can with a lid. Do not use the medicine after the expiration date. Store at room temperature between 15 and 30 degrees C (59 and 86 degrees F). NOTE: This sheet is a summary. It may not cover all possible information. If you have questions about this medicine, talk to your doctor, pharmacist, or health care provider.  2020 Elsevier/Gold Standard (2018-06-01 13:15:55)

## 2019-07-24 NOTE — Progress Notes (Signed)
[facility]   Hospital Follow Up  Subjective:  Patient ID: Andrea Stone, female    DOB: 11-06-1980  Age: 39 y.o. MRN: [id number]  CC:  Chief Complaint  Patient presents with  . Follow-up    HTN  . Hospitalization Follow-up    ED 05/19/2019 Anxiety    HPI Andrea Stone is a 39 year old female who presents for Hospital Follow Up.   Past Medical History:  Diagnosis Date  . Anemia 03/2018  . Anemia 02/2019  . Hyperlipidemia   . Hypertension   . Iron deficiency anemia 03/2018  . Trichomoniasis   . UTI (urinary tract infection) during pregnancy   . Vitamin D deficiency 02/2019   Current Status: Since her last office visit, she is doing well with no complaints. She denies visual changes, chest pain, cough, shortness of breath, heart palpitations, and falls. She has occasional headaches and dizziness with position changes. Denies severe headaches, confusion, seizures, double vision, and blurred vision, nausea and vomiting. Her anxiety is moderate today, r/t chronic foot pain.She denies suicidal ideations, homicidal ideations, or auditory hallucinations. She denies fevers, chills, recent infections, weight loss, and night sweats. She has not had any headaches, visual changes, dizziness, and falls. No chest pain, heart palpitations, cough and shortness of breath reported. No reports of GI problems such as nausea, vomiting, diarrhea, and constipation. She has no reports of blood in stools, dysuria and hematuria.    Past Surgical History:  Procedure Laterality Date  . CARDIOPULMONARY EXERCISE TEST  07/2017    No regional WMA. No valvular lesions. cardiopulmonary limitation seen. Peak VO2 normal for workload achieved on submaximal test. Dyspnea most likely related to deconditioning & limitations due to weight. Normal Study from a Heart & Lung standpoint.  Marland Kitchen LAPAROSCOPIC TUBAL LIGATION Bilateral 02/06/2013   Procedure: LAPAROSCOPIC TUBAL LIGATION;   Surgeon: Kathreen Cosier, MD;  Location: [facility];  Service: Gynecology;  Laterality: Bilateral;  . TRANSTHORACIC ECHOCARDIOGRAM  07/2017   EF 60-65%.  No regional WMA. No valvular lesions.  . WISDOM TOOTH EXTRACTION      Family History  Problem Relation Age of Onset  . Kidney disease Maternal Grandmother   . Heart failure Maternal Grandmother   . Hypertension Mother   . Asthma Sister   . Coronary artery disease Father        CABG - in 76s.  . Anesthesia problems Neg Hx   . Other Neg Hx     Social History   Socioeconomic History  . Marital status: Single    Spouse name: Not on file  . Number of children: 3  . Years of education: Not on file  . Highest education level: High school graduate  Occupational History  . Not on file  Tobacco Use  . Smoking status: Current Some Day Smoker    Packs/day: 0.10    Years: 13.00    Pack years: 1.30    Types: Cigarettes    Last attempt to quit: 05/27/2017    Years since quitting: 2.1  . Smokeless tobacco: Never Used  Substance and Sexual Activity  . Alcohol use: Yes    Comment: socially  . Drug use: No  . Sexual activity: Yes    Birth control/protection: None  Other Topics Concern  . Not on file  Social History Narrative   Disabled - (since childhood).   Social Determinants of Health   Financial Resource Strain:   . Difficulty of Paying Living  Expenses: Not on file  Food Insecurity:   . Worried About Programme researcher, broadcasting/film/video in the Last Year: Not on file  . Ran Out of Food in the Last Year: Not on file  Transportation Needs:   . Lack of Transportation (Medical): Not on file  . Lack of Transportation (Non-Medical): Not on file  Physical Activity:   . Days of Exercise per Week: Not on file  . Minutes of Exercise per Session: Not on file  Stress:   . Feeling of Stress : Not on file  Social Connections:   . Frequency of Communication with Friends and Family: Not on file  . Frequency of Social Gatherings with Friends and  Family: Not on file  . Attends Religious Services: Not on file  . Active Member of Clubs or Organizations: Not on file  . Attends Banker Meetings: Not on file  . Marital Status: Not on file  Intimate Partner Violence:   . Fear of Current or Ex-Partner: Not on file  . Emotionally Abused: Not on file  . Physically Abused: Not on file  . Sexually Abused: Not on file    Outpatient Medications Prior to Visit  Medication Sig Dispense Refill  . gabapentin (NEURONTIN) 300 MG capsule Take 2 capsules (Total = 600 mg), 2 times a day. (Patient taking differently: Take 600 mg by mouth 2 (two) times daily. ) 120 capsule 3  . aspirin 81 MG EC tablet TAKE 1 TABLET BY MOUTH EVERY DAY (Patient not taking: Reported on 11/19/2018) 90 tablet 1  . diclofenac sodium (VOLTAREN) 1 % GEL Apply 4 g topically 4 (four) times daily. (Patient not taking: Reported on 11/19/2018) 1 Tube 2  . ferrous sulfate 325 (65 FE) MG tablet TAKE 1 TABLET BY MOUTH EVERY DAY (Patient taking differently: Take 325 mg by mouth daily with breakfast. ) 90 tablet 1  . ibuprofen (ADVIL,MOTRIN) 200 MG tablet Take 200 mg by mouth every 6 (six) hours as needed.    . methocarbamol (ROBAXIN) 500 MG tablet Take 1 tablet (500 mg total) by mouth 2 (two) times daily as needed for muscle spasms. 60 tablet 2  . pravastatin (PRAVACHOL) 80 MG tablet Take 1 tablet (80 mg total) by mouth daily. 90 tablet 3  . Vitamin D, Ergocalciferol, (DRISDOL) 1.25 MG (50000 UNIT) CAPS capsule Take 1 capsule (50,000 Units total) by mouth every 7 (seven) days. 15 capsule 1  . sulfamethoxazole-trimethoprim (BACTRIM DS) 800-160 MG tablet Take 1 tablet by mouth 2 (two) times daily. (Patient not taking: Reported on 05/17/2019) 14 tablet 0  . topiramate (TOPAMAX) 25 MG tablet Take 1 tablet (25 mg total) by mouth 2 (two) times daily. 60 tablet 1   No facility-administered medications prior to visit.    Allergies  Allergen Reactions  . Zantac [Ranitidine Hcl] Swelling  and Rash    ROS Review of Systems  Constitutional: Negative.   HENT: Negative.   Eyes: Negative.   Respiratory: Negative.   Cardiovascular: Negative.   Gastrointestinal: Negative.   Musculoskeletal:       Bilateral leg pain  Skin: Negative.   Allergic/Immunologic: Negative.   Neurological: Negative.   Hematological: Negative.   Psychiatric/Behavioral: Negative.       Objective:    Physical Exam  Constitutional: She appears well-developed and well-nourished.  Eyes: Conjunctivae are normal.  Cardiovascular: Normal rate, normal heart sounds and intact distal pulses.  Pulmonary/Chest: Effort normal and breath sounds normal.  Abdominal: Soft. Bowel sounds are normal.  Musculoskeletal:  Cervical back: Normal range of motion and neck supple.  Skin: Skin is warm and dry.    BP 120/84   Pulse 93   Temp 98.4 F (36.9 C)   Ht 5\' 4"  (1.626 m)   Wt 166 lb 3.2 oz (75.4 kg)   LMP 07/15/2019   SpO2 100%   BMI 28.53 kg/m  Wt Readings from Last 3 Encounters:  07/24/19 166 lb 3.2 oz (75.4 kg)  04/30/19 161 lb 3.2 oz (73.1 kg)  03/11/19 161 lb 12.8 oz (73.4 kg)     There are no preventive care reminders to display for this patient.  There are no preventive care reminders to display for this patient.  Lab Results  Component Value Date   TSH 0.613 03/11/2019   Lab Results  Component Value Date   WBC 6.2 05/19/2019   HGB 13.4 05/19/2019   HCT 40.0 05/19/2019   MCV 90.9 05/19/2019   PLT 352 05/19/2019   Lab Results  Component Value Date   NA 136 05/19/2019   K 3.3 (L) 05/19/2019   CO2 24 05/19/2019   GLUCOSE 122 (H) 05/19/2019   BUN 5 (L) 05/19/2019   CREATININE 0.82 05/19/2019   BILITOT 0.3 05/19/2019   ALKPHOS 65 05/19/2019   AST 42 (H) 05/19/2019   ALT 50 (H) 05/19/2019   PROT 7.5 05/19/2019   ALBUMIN 4.0 05/19/2019   CALCIUM 9.5 05/19/2019   ANIONGAP 12 05/19/2019   Lab Results  Component Value Date   CHOL 205 (H) 03/11/2019   Lab Results   Component Value Date   HDL 43 03/11/2019   Lab Results  Component Value Date   LDLCALC 147 (H) 03/11/2019   Lab Results  Component Value Date   TRIG 81 03/11/2019   Lab Results  Component Value Date   CHOLHDL 4.8 (H) 03/11/2019   Lab Results  Component Value Date   HGBA1C 5.2 04/30/2019      Assessment & Plan:   1. Essential hypertension The current medical regimen is effective; blood pressure is stable at 120/84 today; continue present plan and medications as prescribed. She will continue to take medications as prescribed, to decrease high sodium intake, excessive alcohol intake, increase potassium intake, smoking cessation, and increase physical activity of at least 30 minutes of cardio activity daily. She will continue to follow Heart Healthy or DASH diet. - POCT urinalysis dipstick - CBC with Differential - Comprehensive metabolic panel - Lipid Panel - TSH - Vitamin B12 - Vitamin D, 25-hydroxy  2. Anxiety - pregabalin (LYRICA) 50 MG capsule; Take 1 capsule (50 mg total) by mouth daily.  Dispense: 30 capsule; Refill: 1  3. Neuropathy We will initiate Lyrica today.  - pregabalin (LYRICA) 50 MG capsule; Take 1 capsule (50 mg total) by mouth daily.  Dispense: 30 capsule; Refill: 1  4. Gastroesophageal reflux disease without esophagitis - H. pylori breath test  5. Encounter for contraceptive management, unspecified type - norethindrone-ethinyl estradiol (LOESTRIN 1/20, 21,) 1-20 MG-MCG tablet; Take 1 tablet by mouth daily.  Dispense: 1 Package; Refill: 11  6. Bilateral foot pain - Uric Acid  7. Follow up She will follow up in 1 month to assess her anxiety and neuropathy.   Meds ordered this encounter  Medications  . pregabalin (LYRICA) 50 MG capsule    Sig: Take 1 capsule (50 mg total) by mouth daily.    Dispense:  30 capsule    Refill:  1  . norethindrone-ethinyl estradiol (LOESTRIN 1/20, 21,) 1-20 MG-MCG  tablet    Sig: Take 1 tablet by mouth daily.     Dispense:  1 Package    Refill:  11    Orders Placed This Encounter  Procedures  . CBC with Differential  . Comprehensive metabolic panel  . Lipid Panel  . TSH  . Vitamin B12  . Vitamin D, 25-hydroxy  . H. pylori breath test  . Uric Acid  . POCT urinalysis dipstick    Referral Orders  No referral(s) requested today    Kathe Becton,  MSN, FNP-BC [address], [place] [address] [phone] fax   Problem List Items Addressed This Visit      Cardiovascular and Mediastinum   Essential hypertension - Primary   Relevant Orders   POCT urinalysis dipstick (Completed)   CBC with Differential   Comprehensive metabolic panel   Lipid Panel   TSH   Vitamin B12   Vitamin D, 25-hydroxy     Nervous and Auditory   Neuropathy   Relevant Medications   pregabalin (LYRICA) 50 MG capsule    Other Visit Diagnoses    Anxiety       Relevant Medications   pregabalin (LYRICA) 50 MG capsule   Gastroesophageal reflux disease without esophagitis       Relevant Orders   H. pylori breath test   Encounter for contraceptive management, unspecified type       Relevant Medications   norethindrone-ethinyl estradiol (LOESTRIN 1/20, 21,) 1-20 MG-MCG tablet   Bilateral foot pain       Relevant Orders   Uric Acid   Follow up          Meds ordered this encounter  Medications  . pregabalin (LYRICA) 50 MG capsule    Sig: Take 1 capsule (50 mg total) by mouth daily.    Dispense:  30 capsule    Refill:  1  . norethindrone-ethinyl estradiol (LOESTRIN 1/20, 21,) 1-20 MG-MCG tablet    Sig: Take 1 tablet by mouth daily.    Dispense:  1 Package    Refill:  11    Follow-up: Return in about 1 month (around 08/21/2019).    Azzie Glatter, FNP

## 2019-07-25 LAB — CBC WITH DIFFERENTIAL/PLATELET
Basophils Absolute: 0 10*3/uL (ref 0.0–0.2)
Basos: 0 %
EOS (ABSOLUTE): 0.2 10*3/uL (ref 0.0–0.4)
Eos: 3 %
Hematocrit: 37.6 % (ref 34.0–46.6)
Hemoglobin: 12.2 g/dL (ref 11.1–15.9)
Immature Grans (Abs): 0 10*3/uL (ref 0.0–0.1)
Immature Granulocytes: 0 %
Lymphocytes Absolute: 3.2 10*3/uL — ABNORMAL HIGH (ref 0.7–3.1)
Lymphs: 45 %
MCH: 29.3 pg (ref 26.6–33.0)
MCHC: 32.4 g/dL (ref 31.5–35.7)
MCV: 90 fL (ref 79–97)
Monocytes Absolute: 0.6 10*3/uL (ref 0.1–0.9)
Monocytes: 9 %
Neutrophils Absolute: 3.1 10*3/uL (ref 1.4–7.0)
Neutrophils: 43 %
Platelets: 421 10*3/uL (ref 150–450)
RBC: 4.16 x10E6/uL (ref 3.77–5.28)
RDW: 13.3 % (ref 11.7–15.4)
WBC: 7.1 10*3/uL (ref 3.4–10.8)

## 2019-07-25 LAB — LIPID PANEL
Chol/HDL Ratio: 4.6 ratio — ABNORMAL HIGH (ref 0.0–4.4)
Cholesterol, Total: 202 mg/dL — ABNORMAL HIGH (ref 100–199)
HDL: 44 mg/dL (ref >39)
LDL Chol Calc (NIH): 133 mg/dL — ABNORMAL HIGH (ref 0–99)
Triglycerides: 137 mg/dL (ref 0–149)
VLDL Cholesterol Cal: 25 mg/dL (ref 5–40)

## 2019-07-25 LAB — COMPREHENSIVE METABOLIC PANEL
ALT: 26 IU/L (ref 0–32)
AST: 18 IU/L (ref 0–40)
Albumin/Globulin Ratio: 1.4 (ref 1.2–2.2)
Albumin: 3.9 g/dL (ref 3.8–4.8)
Alkaline Phosphatase: 70 IU/L (ref 39–117)
BUN/Creatinine Ratio: 9 (ref 9–23)
BUN: 8 mg/dL (ref 6–20)
Bilirubin Total: 0.2 mg/dL (ref 0.0–1.2)
CO2: 25 mmol/L (ref 20–29)
Calcium: 8.8 mg/dL (ref 8.7–10.2)
Chloride: 107 mmol/L — ABNORMAL HIGH (ref 96–106)
Creatinine, Ser: 0.85 mg/dL (ref 0.57–1.00)
GFR calc Af Amer: 101 mL/min/{1.73_m2} (ref >59)
GFR calc non Af Amer: 88 mL/min/{1.73_m2} (ref >59)
Globulin, Total: 2.7 g/dL (ref 1.5–4.5)
Glucose: 78 mg/dL (ref 65–99)
Potassium: 4.1 mmol/L (ref 3.5–5.2)
Sodium: 144 mmol/L (ref 134–144)
Total Protein: 6.6 g/dL (ref 6.0–8.5)

## 2019-07-25 LAB — TSH: TSH: 0.588 u[IU]/mL (ref 0.450–4.500)

## 2019-07-25 LAB — VITAMIN D 25 HYDROXY (VIT D DEFICIENCY, FRACTURES): Vit D, 25-Hydroxy: 29.2 ng/mL — ABNORMAL LOW (ref 30.0–100.0)

## 2019-07-25 LAB — VITAMIN B12: Vitamin B-12: 706 pg/mL (ref 232–1245)

## 2019-07-26 LAB — URIC ACID: Uric Acid: 4.6 mg/dL (ref 2.6–6.2)

## 2019-07-26 LAB — H. PYLORI BREATH TEST: H pylori Breath Test: NEGATIVE

## 2019-07-26 LAB — SPECIMEN STATUS REPORT

## 2019-07-29 ENCOUNTER — Telehealth: Payer: Self-pay

## 2019-08-01 NOTE — Telephone Encounter (Signed)
done

## 2019-08-06 ENCOUNTER — Telehealth: Payer: Self-pay | Admitting: Family Medicine

## 2019-08-06 NOTE — Telephone Encounter (Signed)
Andrea Stone stated during her last office visit you where going to send an Rx for Acid Reflux. Please advise.

## 2019-08-06 NOTE — Telephone Encounter (Signed)
Pt wants a refill on acid reflux medicine. No more refills left at pharmacy.

## 2019-08-07 ENCOUNTER — Other Ambulatory Visit: Payer: Self-pay | Admitting: Family Medicine

## 2019-08-07 ENCOUNTER — Encounter: Payer: Self-pay | Admitting: Family Medicine

## 2019-08-07 DIAGNOSIS — K219 Gastro-esophageal reflux disease without esophagitis: Secondary | ICD-10-CM

## 2019-08-07 MED ORDER — OMEPRAZOLE 20 MG PO CPDR: 20.0000 mg | DELAYED_RELEASE_CAPSULE | Freq: Two times a day (BID) | ORAL | 6 refills | Status: DC

## 2019-08-14 ENCOUNTER — Other Ambulatory Visit: Payer: Self-pay | Admitting: Family Medicine

## 2019-08-14 ENCOUNTER — Telehealth: Payer: Self-pay | Admitting: Family Medicine

## 2019-08-14 DIAGNOSIS — M5410 Radiculopathy, site unspecified: Secondary | ICD-10-CM

## 2019-08-15 ENCOUNTER — Telehealth: Payer: Self-pay | Admitting: Family Medicine

## 2019-08-15 NOTE — Telephone Encounter (Signed)
Pt currently at [facility] for ultrasound. Said they don't have any orders on her. Please follow up with pt asap.

## 2019-08-16 ENCOUNTER — Ambulatory Visit (HOSPITAL_COMMUNITY): Payer: Medicaid Other

## 2019-08-16 ENCOUNTER — Other Ambulatory Visit: Payer: Self-pay | Admitting: Family Medicine

## 2019-08-16 DIAGNOSIS — E785 Hyperlipidemia, unspecified: Secondary | ICD-10-CM

## 2019-08-16 NOTE — Telephone Encounter (Signed)
Done

## 2019-08-20 ENCOUNTER — Telehealth: Payer: Self-pay | Admitting: Family Medicine

## 2019-08-20 NOTE — Telephone Encounter (Signed)
error 

## 2019-08-21 ENCOUNTER — Ambulatory Visit: Payer: Self-pay | Admitting: Family Medicine

## 2019-08-23 ENCOUNTER — Other Ambulatory Visit: Payer: Self-pay

## 2019-08-23 ENCOUNTER — Ambulatory Visit (HOSPITAL_COMMUNITY)
Admission: RE | Admit: 2019-08-23 | Discharge: 2019-08-23 | Disposition: A | Discharge status: Home / Self Care | Payer: Medicaid Other | Source: Ambulatory Visit | Attending: Family Medicine | Admitting: Family Medicine

## 2019-08-23 DIAGNOSIS — M5410 Radiculopathy, site unspecified: Secondary | ICD-10-CM | POA: Insufficient documentation

## 2019-08-23 NOTE — Progress Notes (Signed)
Bilateral lower extremity venous duplex exam completed.  Preliminary results can be found under CV proc under chart review.  08/23/2019 10:20 AM  Tonyia Marschall, K., RDMS, RVT

## 2019-08-27 ENCOUNTER — Other Ambulatory Visit: Payer: Self-pay | Admitting: Family Medicine

## 2019-08-27 DIAGNOSIS — M5410 Radiculopathy, site unspecified: Secondary | ICD-10-CM

## 2019-08-27 DIAGNOSIS — M79671 Pain in right foot: Secondary | ICD-10-CM

## 2019-08-28 ENCOUNTER — Telehealth: Payer: Self-pay | Admitting: Family Medicine

## 2019-08-28 NOTE — Telephone Encounter (Signed)
Location changed. Patient informed.

## 2019-09-04 ENCOUNTER — Telehealth: Payer: Self-pay | Admitting: Vascular Surgery

## 2019-09-04 NOTE — Telephone Encounter (Signed)
Called patient and left message on machine advising her that her Medicaid Authorization for her Ultra Sound was approved and reminded patient of her appt for next week.

## 2019-09-05 ENCOUNTER — Telehealth (HOSPITAL_COMMUNITY): Payer: Self-pay

## 2019-09-05 NOTE — Telephone Encounter (Signed)

## 2019-09-06 ENCOUNTER — Other Ambulatory Visit: Payer: Self-pay | Admitting: *Deleted

## 2019-09-06 DIAGNOSIS — M5410 Radiculopathy, site unspecified: Secondary | ICD-10-CM

## 2019-09-09 ENCOUNTER — Ambulatory Visit (HOSPITAL_COMMUNITY)
Admission: RE | Admit: 2019-09-09 | Discharge: 2019-09-09 | Disposition: A | Discharge status: Home / Self Care | Payer: Medicaid Other | Source: Ambulatory Visit | Attending: Vascular Surgery | Admitting: Vascular Surgery

## 2019-09-09 ENCOUNTER — Other Ambulatory Visit: Payer: Self-pay

## 2019-09-09 DIAGNOSIS — M5410 Radiculopathy, site unspecified: Secondary | ICD-10-CM | POA: Insufficient documentation

## 2019-09-10 ENCOUNTER — Ambulatory Visit (INDEPENDENT_AMBULATORY_CARE_PROVIDER_SITE_OTHER): Payer: Medicaid Other | Admitting: Vascular Surgery

## 2019-09-10 ENCOUNTER — Encounter: Payer: Self-pay | Admitting: Vascular Surgery

## 2019-09-10 VITALS — BP 141/89 | HR 67 | Temp 97.3°F | Resp 20 | Ht 64.0 in | Wt 169.0 lb

## 2019-09-10 DIAGNOSIS — M5410 Radiculopathy, site unspecified: Secondary | ICD-10-CM

## 2019-09-10 NOTE — Progress Notes (Signed)
[facility]  Patient name: [name] MRN: [id number] DOB: January 18, 1981 Sex: female  REASON FOR CONSULT: Evaluate bilateral lower extremity discomfort to rule out vascular etiology  HPI: Andrea Stone is a 39 y.o. female, who is here today for evaluation of lower extremity discomfort.  She reports that she has a burning and stinging sensation that begins at the bottom of both feet and extends up to her knee.  This is not related to activity or rest.  She denies any lower extremity swelling.  No history of DVT.  No history of lower extremity tissue loss.  She did have an evaluation several years ago with neurology and I have reviewed her films.  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 Acid reflux 07/2019  . Anemia 03/2018  . Anemia 02/2019  . Hyperlipidemia   . Hypertension   . Iron deficiency anemia 03/2018  . Trichomoniasis   . UTI (urinary tract infection) during pregnancy   . Vitamin D deficiency 02/2019    Family History  Problem Relation Age of Onset  . Kidney disease Maternal Grandmother   . Heart failure Maternal Grandmother   . Hypertension Mother   . Asthma Sister   . Coronary artery disease Father        CABG - in 66s.  . Anesthesia problems Neg Hx   . Other Neg Hx     SOCIAL HISTORY: Social History   Socioeconomic History  . Marital status: Single    Spouse name: Not on file  . Number of children: 3  . Years of education: Not on file  . Highest education level: High school graduate  Occupational History  . Not on file  Tobacco Use  . Smoking status: Former Smoker    Packs/day: 0.10    Years: 13.00    Pack years: 1.30    Types: Cigarettes    Quit date: 05/27/2017    Years since quitting: 2.2  . Smokeless tobacco: Never Used  Substance and Sexual Activity  . Alcohol use: Yes    Comment: socially  .  Drug use: No  . Sexual activity: Yes    Birth control/protection: None  Other Topics Concern  . Not on file  Social History Narrative   Disabled - (since childhood).   Social Determinants of Health   Financial Resource Strain:   . Difficulty of Paying Living Expenses:   Food Insecurity:   . Worried About Programme researcher, broadcasting/film/video in the Last Year:   . Barista in the Last Year:   Transportation Needs:   . Freight forwarder (Medical):   Marland Kitchen Lack of Transportation (Non-Medical):   Physical Activity:   . Days of Exercise per Week:   . Minutes of Exercise per Session:   Stress:   . Feeling of Stress :   Social Connections:   . Frequency of Communication with Friends and Family:   . Frequency of Social Gatherings with Friends and Family:   . Attends Religious Services:   . Active Member of Clubs or Organizations:   . Attends Banker Meetings:   Marland Kitchen Marital Status:   Intimate Partner Violence:   . Fear of Current or Ex-Partner:   . Emotionally Abused:   Marland Kitchen Physically Abused:   . Sexually Abused:     Allergies  Allergen Reactions  . Zantac [Ranitidine Hcl]  Swelling and Rash    Current Outpatient Medications  Medication Sig Dispense Refill  . aspirin 81 MG EC tablet TAKE 1 TABLET BY MOUTH EVERY DAY 90 tablet 1  . ferrous sulfate 325 (65 FE) MG tablet TAKE 1 TABLET BY MOUTH EVERY DAY (Patient taking differently: Take 325 mg by mouth daily with breakfast. ) 90 tablet 1  . ibuprofen (ADVIL,MOTRIN) 200 MG tablet Take 200 mg by mouth every 6 (six) hours as needed.    . methocarbamol (ROBAXIN) 500 MG tablet Take 1 tablet (500 mg total) by mouth 2 (two) times daily as needed for muscle spasms. 60 tablet 2  . norethindrone-ethinyl estradiol (LOESTRIN 1/20, 21,) 1-20 MG-MCG tablet Take 1 tablet by mouth daily. 1 Package 11  . omeprazole (PRILOSEC) 20 MG capsule Take 1 capsule (20 mg total) by mouth 2 (two) times daily before a meal. 60 capsule 6  . pravastatin  (PRAVACHOL) 80 MG tablet TAKE 1 TABLET BY MOUTH EVERY DAY 90 tablet 3  . Vitamin D, Ergocalciferol, (DRISDOL) 1.25 MG (50000 UNIT) CAPS capsule Take 1 capsule (50,000 Units total) by mouth every 7 (seven) days. 15 capsule 1  . diclofenac sodium (VOLTAREN) 1 % GEL Apply 4 g topically 4 (four) times daily. (Patient not taking: Reported on 11/19/2018) 1 Tube 2  . pregabalin (LYRICA) 50 MG capsule Take 1 capsule (50 mg total) by mouth daily. (Patient not taking: Reported on 09/10/2019) 30 capsule 1   No current facility-administered medications for this visit.    REVIEW OF SYSTEMS:  [X]  denotes positive finding, [ ]  denotes negative finding Cardiac  Comments:  Chest pain or chest pressure:    Shortness of breath upon exertion:    Short of breath when lying flat:    Irregular heart rhythm:        Vascular    Pain in calf, thigh, or hip brought on by ambulation: x   Pain in feet at night that wakes you up from your sleep:  x   Blood clot in your veins:    Leg swelling:         Pulmonary    Oxygen at home:    Productive cough:     Wheezing:         Neurologic    Sudden weakness in arms or legs:     Sudden numbness in arms or legs:  x   Sudden onset of difficulty speaking or slurred speech:    Temporary loss of vision in one eye:     Problems with dizziness:         Gastrointestinal    Blood in stool:     Vomited blood:         Genitourinary    Burning when urinating:     Blood in urine:        Psychiatric    Major depression:         Hematologic    Bleeding problems:    Problems with blood clotting too easily:        Skin    Rashes or ulcers:        Constitutional    Fever or chills:      PHYSICAL EXAM: Vitals:   09/10/19 0817  BP: (!) 141/89  Pulse: 67  Resp: 20  Temp: (!) 97.3 F (36.3 C)  SpO2: 100%  Weight: 169 lb (76.7 kg)  Height: 5\' 4"  (1.626 m)    GENERAL: The patient is a well-nourished female, in no  acute distress. The vital signs are documented  above. CARDIOVASCULAR: Carotid arteries are without bruits bilaterally.  2+ radial pulses bilaterally 2+ popliteal and 2+ dorsalis pedis pulses bilaterally PULMONARY: There is good air exchange  ABDOMEN: Soft and non-tender  MUSCULOSKELETAL: There are no major deformities or cyanosis. NEUROLOGIC: No focal weakness or paresthesias are detected. SKIN: There are no ulcers or rashes noted. PSYCHIATRIC: The patient has a normal affect.  DATA:  Her vascular noninvasive studies were reviewed with the patient.  On 08/23/2019 she had a normal lower extremity venous study.  On 09/09/2019 she had lower extremity arterial studies which were normal ankle arm index and normal lower extremity waveforms  MEDICAL ISSUES: I discussed these findings with the patient.  Her symptoms do sound to be more neurogenic.  She has no evidence of arterial or venous pathology to explain her symptoms.  She was reassured with this discussion will see [place] again on an as-needed basis   Rosetta Posner, MD [facility] Tel [phone] Pager [phone]

## 2019-09-12 ENCOUNTER — Telehealth: Payer: Self-pay | Admitting: Family Medicine

## 2019-09-12 NOTE — Telephone Encounter (Signed)
Patient has requested a refill of Lyrica. Please advise.

## 2019-09-12 NOTE — Telephone Encounter (Signed)
Pt wants to know the name of the medicine that helps support her back/ pain in general. She wants to know if she can still pick it up. Pt believes medicine starts with an R. Please call pt back for more info.

## 2019-09-15 ENCOUNTER — Other Ambulatory Visit: Payer: Self-pay | Admitting: Family Medicine

## 2019-09-15 DIAGNOSIS — F419 Anxiety disorder, unspecified: Secondary | ICD-10-CM

## 2019-09-15 DIAGNOSIS — G629 Polyneuropathy, unspecified: Secondary | ICD-10-CM

## 2019-09-16 ENCOUNTER — Other Ambulatory Visit: Payer: Self-pay | Admitting: Family Medicine

## 2019-09-16 DIAGNOSIS — F419 Anxiety disorder, unspecified: Secondary | ICD-10-CM

## 2019-09-16 DIAGNOSIS — G629 Polyneuropathy, unspecified: Secondary | ICD-10-CM

## 2019-09-16 MED ORDER — PREGABALIN 50 MG PO CAPS: 50.0000 mg | ORAL_CAPSULE | Freq: Every day | ORAL | 0 refills | Status: DC

## 2019-09-17 ENCOUNTER — Telehealth: Payer: Self-pay | Admitting: Family Medicine

## 2019-09-18 NOTE — Telephone Encounter (Signed)
Done

## 2019-09-20 ENCOUNTER — Other Ambulatory Visit: Payer: Self-pay

## 2019-09-20 ENCOUNTER — Ambulatory Visit (INDEPENDENT_AMBULATORY_CARE_PROVIDER_SITE_OTHER): Payer: Medicaid Other | Admitting: Family Medicine

## 2019-09-20 ENCOUNTER — Encounter: Payer: Self-pay | Admitting: Family Medicine

## 2019-09-20 ENCOUNTER — Ambulatory Visit: Payer: Self-pay | Admitting: Family Medicine

## 2019-09-20 VITALS — BP 105/65 | HR 88 | Temp 98.5°F | Ht 64.0 in | Wt 166.8 lb

## 2019-09-20 DIAGNOSIS — M5410 Radiculopathy, site unspecified: Secondary | ICD-10-CM | POA: Diagnosis not present

## 2019-09-20 DIAGNOSIS — F419 Anxiety disorder, unspecified: Secondary | ICD-10-CM

## 2019-09-20 DIAGNOSIS — I10 Essential (primary) hypertension: Secondary | ICD-10-CM

## 2019-09-20 DIAGNOSIS — M79672 Pain in left foot: Secondary | ICD-10-CM

## 2019-09-20 DIAGNOSIS — Z09 Encounter for follow-up examination after completed treatment for conditions other than malignant neoplasm: Secondary | ICD-10-CM

## 2019-09-20 DIAGNOSIS — M79671 Pain in right foot: Secondary | ICD-10-CM

## 2019-09-20 LAB — POCT URINALYSIS DIPSTICK
Bilirubin, UA: NEGATIVE
Blood, UA: NEGATIVE
Glucose, UA: NEGATIVE
Ketones, UA: NEGATIVE
Nitrite, UA: NEGATIVE
Protein, UA: NEGATIVE
Spec Grav, UA: 1.01 (ref 1.010–1.025)
Urobilinogen, UA: 0.2 E.U./dL
pH, UA: 6 (ref 5.0–8.0)

## 2019-09-20 MED ORDER — METHOCARBAMOL 750 MG PO TABS: 750.0000 mg | ORAL_TABLET | Freq: Two times a day (BID) | ORAL | 3 refills | Status: DC | PRN

## 2019-09-20 NOTE — Progress Notes (Signed)
[facility]   Established Patient Office Visit  Subjective:  Patient ID: Andrea Stone, female    DOB: 1980-08-01  Age: 39 y.o. MRN: [id number]  CC:  Chief Complaint  Patient presents with  . Follow-up    knee, leg & ankle pain in both legs    HPI Andrea Stone is a 39 year old female who presents for Follow Up today.   Past Medical History:  Diagnosis Date  . Acid reflux 07/2019  . Anemia 03/2018  . Anemia 02/2019  . Hyperlipidemia   . Hypertension   . Iron deficiency anemia 03/2018  . Trichomoniasis   . UTI (urinary tract infection) during pregnancy   . Vitamin D deficiency 02/2019   Current Status: Since her last office visit, she is doing well with no complaints. She denies visual changes, chest pain, cough, shortness of breath, heart palpitations, and falls. She has occasional headaches and dizziness with position changes. Denies severe headaches, confusion, seizures, double vision, and blurred vision, nausea and vomiting. Her anxiety is mild today. She states that she has misplaced her Lyrica. She denies suicidal ideations, homicidal ideations, or auditory hallucinations. She denies fevers, chills, fatigue, recent infections, weight loss, and night sweats. No reports of GI problems such as diarrhea, and constipation. She has no reports of blood in stools, dysuria and hematuria. She is taking all medications as prescribed.   Past Surgical History:  Procedure Laterality Date  . CARDIOPULMONARY EXERCISE TEST  07/2017    No regional WMA. No valvular lesions. cardiopulmonary limitation seen. Peak VO2 normal for workload achieved on submaximal test. Dyspnea most likely related to deconditioning & limitations due to weight. Normal Study from a Heart & Lung standpoint.  Marland Kitchen LAPAROSCOPIC TUBAL LIGATION Bilateral 02/06/2013   Procedure: LAPAROSCOPIC TUBAL LIGATION;  Surgeon: Kathreen Cosier, MD;  Location: [facility];  Service: Gynecology;   Laterality: Bilateral;  . TRANSTHORACIC ECHOCARDIOGRAM  07/2017   EF 60-65%.  No regional WMA. No valvular lesions.  . WISDOM TOOTH EXTRACTION      Family History  Problem Relation Age of Onset  . Kidney disease Maternal Grandmother   . Heart failure Maternal Grandmother   . Hypertension Mother   . Asthma Sister   . Coronary artery disease Father        CABG - in 85s.  . Anesthesia problems Neg Hx   . Other Neg Hx     Social History   Socioeconomic History  . Marital status: Single    Spouse name: Not on file  . Number of children: 3  . Years of education: Not on file  . Highest education level: High school graduate  Occupational History  . Not on file  Tobacco Use  . Smoking status: Former Smoker    Packs/day: 0.10    Years: 13.00    Pack years: 1.30    Types: Cigarettes    Quit date: 05/27/2017    Years since quitting: 2.3  . Smokeless tobacco: Never Used  Substance and Sexual Activity  . Alcohol use: Yes    Comment: socially  . Drug use: No  . Sexual activity: Yes    Birth control/protection: None  Other Topics Concern  . Not on file  Social History Narrative   Disabled - (since childhood).   Social Determinants of Health   Financial Resource Strain:   . Difficulty of Paying Living Expenses:   Food Insecurity:   . Worried About Radiation protection practitioner  of Food in the Last Year:   . Ran Out of Food in the Last Year:   Transportation Needs:   . Lack of Transportation (Medical):   Marland Kitchen Lack of Transportation (Non-Medical):   Physical Activity:   . Days of Exercise per Week:   . Minutes of Exercise per Session:   Stress:   . Feeling of Stress :   Social Connections:   . Frequency of Communication with Friends and Family:   . Frequency of Social Gatherings with Friends and Family:   . Attends Religious Services:   . Active Member of Clubs or Organizations:   . Attends Banker Meetings:   Marland Kitchen Marital Status:   Intimate Partner Violence:   . Fear of  Current or Ex-Partner:   . Emotionally Abused:   Marland Kitchen Physically Abused:   . Sexually Abused:     Outpatient Medications Prior to Visit  Medication Sig Dispense Refill  . aspirin 81 MG EC tablet TAKE 1 TABLET BY MOUTH EVERY DAY 90 tablet 1  . ferrous sulfate 325 (65 FE) MG tablet TAKE 1 TABLET BY MOUTH EVERY DAY (Patient taking differently: Take 325 mg by mouth daily with breakfast. ) 90 tablet 1  . ibuprofen (ADVIL,MOTRIN) 200 MG tablet Take 200 mg by mouth every 6 (six) hours as needed.    . norethindrone-ethinyl estradiol (LOESTRIN 1/20, 21,) 1-20 MG-MCG tablet Take 1 tablet by mouth daily. 1 Package 11  . omeprazole (PRILOSEC) 20 MG capsule Take 1 capsule (20 mg total) by mouth 2 (two) times daily before a meal. 60 capsule 6  . pravastatin (PRAVACHOL) 80 MG tablet TAKE 1 TABLET BY MOUTH EVERY DAY 90 tablet 3  . pregabalin (LYRICA) 50 MG capsule Take 1 capsule (50 mg total) by mouth daily. 30 capsule 0  . Vitamin D, Ergocalciferol, (DRISDOL) 1.25 MG (50000 UNIT) CAPS capsule Take 1 capsule (50,000 Units total) by mouth every 7 (seven) days. 15 capsule 1  . diclofenac sodium (VOLTAREN) 1 % GEL Apply 4 g topically 4 (four) times daily. (Patient not taking: Reported on 09/20/2019) 1 Tube 2  . methocarbamol (ROBAXIN) 500 MG tablet Take 1 tablet (500 mg total) by mouth 2 (two) times daily as needed for muscle spasms. 60 tablet 2   No facility-administered medications prior to visit.    Allergies  Allergen Reactions  . Zantac [Ranitidine Hcl] Swelling and Rash    ROS Review of Systems  Constitutional: Negative.   HENT: Negative.   Eyes: Negative.   Respiratory: Negative.   Cardiovascular: Negative.   Gastrointestinal: Negative.   Endocrine: Negative.   Genitourinary: Negative.   Musculoskeletal: Positive for arthralgias (chronic bilateral leg ).  Skin: Negative.   Allergic/Immunologic: Negative.   Neurological: Positive for dizziness (occasional ) and headaches (occasional ).    Hematological: Negative.   Psychiatric/Behavioral: Negative.       Objective:    Physical Exam  Constitutional: She appears well-developed and well-nourished.  HENT:  Head: Normocephalic and atraumatic.  Eyes: Conjunctivae are normal.  Cardiovascular: Normal rate, regular rhythm, normal heart sounds and intact distal pulses.  Pulmonary/Chest: Effort normal and breath sounds normal.  Abdominal: Soft. Bowel sounds are normal. She exhibits distension (occasional ).  Musculoskeletal:     Cervical back: Normal range of motion and neck supple.  Nursing note and vitals reviewed.   BP 105/65   Pulse 88   Temp 98.5 F (36.9 C) (Oral)   Ht 5\' 4"  (1.626 m)   Wt 166 lb  12.8 oz (75.7 kg)   LMP 08/26/2019   SpO2 100%   BMI 28.63 kg/m  Wt Readings from Last 3 Encounters:  09/20/19 166 lb 12.8 oz (75.7 kg)  09/10/19 169 lb (76.7 kg)  07/24/19 166 lb 3.2 oz (75.4 kg)     There are no preventive care reminders to display for this patient.  There are no preventive care reminders to display for this patient.  Lab Results  Component Value Date   TSH 0.588 07/24/2019   Lab Results  Component Value Date   WBC 7.1 07/24/2019   HGB 12.2 07/24/2019   HCT 37.6 07/24/2019   MCV 90 07/24/2019   PLT 421 07/24/2019   Lab Results  Component Value Date   NA 144 07/24/2019   K 4.1 07/24/2019   CO2 25 07/24/2019   GLUCOSE 78 07/24/2019   BUN 8 07/24/2019   CREATININE 0.85 07/24/2019   BILITOT 0.2 07/24/2019   ALKPHOS 70 07/24/2019   AST 18 07/24/2019   ALT 26 07/24/2019   PROT 6.6 07/24/2019   ALBUMIN 3.9 07/24/2019   CALCIUM 8.8 07/24/2019   ANIONGAP 12 05/19/2019   Lab Results  Component Value Date   CHOL 202 (H) 07/24/2019   Lab Results  Component Value Date   HDL 44 07/24/2019   Lab Results  Component Value Date   LDLCALC 133 (H) 07/24/2019   Lab Results  Component Value Date   TRIG 137 07/24/2019   Lab Results  Component Value Date   CHOLHDL 4.6 (H)  07/24/2019   Lab Results  Component Value Date   HGBA1C 5.2 04/30/2019   Assessment & Plan:   1. Essential hypertension The current medical regimen is effective; blood pressure is stable at 105/65 today; continue present plan and medications as prescribed. She will continue to take medications as prescribed, to decrease high sodium intake, excessive alcohol intake, increase potassium intake, smoking cessation, and increase physical activity of at least 30 minutes of cardio activity daily. She will continue to follow Heart Healthy or DASH diet. - POCT urinalysis dipstick  2. Bilateral radiating leg pain We will increase dosage of Methocarbamol today.  - methocarbamol (ROBAXIN-750) 750 MG tablet; Take 1 tablet (750 mg total) by mouth 2 (two) times daily as needed for muscle spasms.  Dispense: 60 tablet; Refill: 3 - Ambulatory referral to Pain Clinic  3. Bilateral foot pain - methocarbamol (ROBAXIN-750) 750 MG tablet; Take 1 tablet (750 mg total) by mouth 2 (two) times daily as needed for muscle spasms.  Dispense: 60 tablet; Refill: 3 - Ambulatory referral to Pain Clinic  4. Anxiety  5. Follow up She will follow up in 6 months.   Meds ordered this encounter  Medications  . methocarbamol (ROBAXIN-750) 750 MG tablet    Sig: Take 1 tablet (750 mg total) by mouth 2 (two) times daily as needed for muscle spasms.    Dispense:  60 tablet    Refill:  3    Orders Placed This Encounter  Procedures  . Ambulatory referral to Pain Clinic  . POCT urinalysis dipstick     Referral Orders     Ambulatory referral to [facility],  MSN, [facility] [address] [place], [place] [address] [phone] fax    Problem List Items Addressed This Visit      Cardiovascular and Mediastinum   Essential hypertension - Primary   Relevant Orders  POCT urinalysis dipstick (Completed)       Other   Anxiety   Bilateral foot pain   Relevant Medications   methocarbamol (ROBAXIN-750) 750 MG tablet   Other Relevant Orders   Ambulatory referral to Pain Clinic    Other Visit Diagnoses    Bilateral radiating leg pain       Relevant Medications   methocarbamol (ROBAXIN-750) 750 MG tablet   Other Relevant Orders   Ambulatory referral to Pain Clinic   Follow up          Meds ordered this encounter  Medications  . methocarbamol (ROBAXIN-750) 750 MG tablet    Sig: Take 1 tablet (750 mg total) by mouth 2 (two) times daily as needed for muscle spasms.    Dispense:  60 tablet    Refill:  3    Follow-up: Return in about 6 months (around 03/21/2020).    Kallie Locks, FNP

## 2019-10-11 ENCOUNTER — Telehealth: Payer: Self-pay | Admitting: Family Medicine

## 2019-10-14 ENCOUNTER — Telehealth: Payer: Self-pay

## 2019-10-14 NOTE — Telephone Encounter (Signed)
Message left for call back 

## 2019-10-14 NOTE — Telephone Encounter (Signed)
Tried to call pt , she was talking to some else in the back ground. And wouldn't acknowledge the call.

## 2019-10-14 NOTE — Telephone Encounter (Signed)
Sent to [facility] 

## 2019-10-16 ENCOUNTER — Other Ambulatory Visit: Payer: Self-pay | Admitting: Family Medicine

## 2019-10-16 DIAGNOSIS — D509 Iron deficiency anemia, unspecified: Secondary | ICD-10-CM

## 2019-10-28 ENCOUNTER — Telehealth: Payer: Self-pay | Admitting: Family Medicine

## 2019-10-28 ENCOUNTER — Ambulatory Visit: Payer: Medicaid Other | Admitting: Family Medicine

## 2019-10-28 NOTE — Telephone Encounter (Signed)
Pt has questions about covid shot. Please call pt back

## 2019-10-29 ENCOUNTER — Telehealth: Payer: Self-pay

## 2019-10-29 NOTE — Telephone Encounter (Signed)
Patient informed. 

## 2019-10-29 NOTE — Telephone Encounter (Signed)
Patient needs to know if its safe for her to get the Covid vaccination?

## 2019-11-02 ENCOUNTER — Other Ambulatory Visit: Payer: Self-pay | Admitting: Family Medicine

## 2019-11-02 DIAGNOSIS — F419 Anxiety disorder, unspecified: Secondary | ICD-10-CM

## 2019-11-02 DIAGNOSIS — G629 Polyneuropathy, unspecified: Secondary | ICD-10-CM

## 2019-11-04 ENCOUNTER — Other Ambulatory Visit: Payer: Self-pay | Admitting: Family Medicine

## 2019-11-04 DIAGNOSIS — F419 Anxiety disorder, unspecified: Secondary | ICD-10-CM

## 2019-11-04 DIAGNOSIS — G629 Polyneuropathy, unspecified: Secondary | ICD-10-CM

## 2019-11-04 MED ORDER — PREGABALIN 50 MG PO CAPS: 50.0000 mg | ORAL_CAPSULE | Freq: Every day | ORAL | 3 refills | Status: DC

## 2019-11-04 NOTE — Telephone Encounter (Signed)
Lyrica refill request 

## 2019-12-18 ENCOUNTER — Encounter: Payer: Self-pay | Admitting: Family Medicine

## 2020-01-03 ENCOUNTER — Other Ambulatory Visit: Payer: Self-pay | Admitting: Family Medicine

## 2020-01-03 DIAGNOSIS — F419 Anxiety disorder, unspecified: Secondary | ICD-10-CM

## 2020-01-03 DIAGNOSIS — G629 Polyneuropathy, unspecified: Secondary | ICD-10-CM

## 2020-01-03 NOTE — Telephone Encounter (Signed)
Refill for Lyrica. Please advise.

## 2020-02-04 ENCOUNTER — Ambulatory Visit (INDEPENDENT_AMBULATORY_CARE_PROVIDER_SITE_OTHER): Payer: Medicaid Other | Admitting: Family Medicine

## 2020-02-04 ENCOUNTER — Encounter: Payer: Self-pay | Admitting: Family Medicine

## 2020-02-04 ENCOUNTER — Other Ambulatory Visit: Payer: Self-pay

## 2020-02-04 VITALS — BP 111/71 | HR 59 | Temp 97.7°F | Resp 16 | Ht 64.0 in | Wt 152.8 lb

## 2020-02-04 DIAGNOSIS — M5410 Radiculopathy, site unspecified: Secondary | ICD-10-CM

## 2020-02-04 DIAGNOSIS — I10 Essential (primary) hypertension: Secondary | ICD-10-CM | POA: Diagnosis not present

## 2020-02-04 DIAGNOSIS — K219 Gastro-esophageal reflux disease without esophagitis: Secondary | ICD-10-CM

## 2020-02-04 DIAGNOSIS — M79672 Pain in left foot: Secondary | ICD-10-CM

## 2020-02-04 DIAGNOSIS — F419 Anxiety disorder, unspecified: Secondary | ICD-10-CM

## 2020-02-04 DIAGNOSIS — M79671 Pain in right foot: Secondary | ICD-10-CM | POA: Diagnosis not present

## 2020-02-04 DIAGNOSIS — E785 Hyperlipidemia, unspecified: Secondary | ICD-10-CM

## 2020-02-04 DIAGNOSIS — Z09 Encounter for follow-up examination after completed treatment for conditions other than malignant neoplasm: Secondary | ICD-10-CM

## 2020-02-04 DIAGNOSIS — G629 Polyneuropathy, unspecified: Secondary | ICD-10-CM

## 2020-02-04 LAB — POCT URINALYSIS DIPSTICK
Bilirubin, UA: NEGATIVE
Glucose, UA: NEGATIVE
Ketones, UA: NEGATIVE
Leukocytes, UA: NEGATIVE
Nitrite, UA: NEGATIVE
Protein, UA: NEGATIVE
Spec Grav, UA: >=1.03 — AB (ref 1.010–1.025)
Urobilinogen, UA: 0.2 E.U./dL
pH, UA: 6 (ref 5.0–8.0)

## 2020-02-04 MED ORDER — IBUPROFEN 800 MG PO TABS: 800.0000 mg | ORAL_TABLET | Freq: Three times a day (TID) | ORAL | 3 refills | Status: DC | PRN

## 2020-02-04 MED ORDER — METHOCARBAMOL 750 MG PO TABS: 750.0000 mg | ORAL_TABLET | Freq: Two times a day (BID) | ORAL | 6 refills | Status: DC | PRN

## 2020-02-04 MED ORDER — PRAVASTATIN SODIUM 80 MG PO TABS: 80.0000 mg | ORAL_TABLET | Freq: Every day | ORAL | 3 refills | Status: DC

## 2020-02-04 MED ORDER — OMEPRAZOLE 20 MG PO CPDR: 20.0000 mg | DELAYED_RELEASE_CAPSULE | Freq: Two times a day (BID) | ORAL | 6 refills | Status: DC

## 2020-02-04 MED ORDER — PREGABALIN 50 MG PO CAPS: ORAL_CAPSULE | ORAL | 6 refills | Status: DC

## 2020-02-04 NOTE — Progress Notes (Signed)
[facility]    Established Patient Office Visit  Subjective:  Patient ID: Andrea Stone, female    DOB: 1981/03/23  Age: 39 y.o. MRN: [id number]  CC:  Chief Complaint  Patient presents with  . Follow-up    Pt states no question or concerns to discuss today.    HPI Andrea Stone is a 39 year old female who presents for Follow Up today.    Patient Active Problem List   Diagnosis Date Noted  . Anxiety 07/24/2019  . Gastroesophageal reflux disease without esophagitis 07/24/2019  . Bilateral foot pain 07/24/2019  . Alcohol withdrawal delirium (HCC) 05/17/2019  . Back pain 05/01/2019  . Anemia 03/12/2019  . Iron deficiency anemia 03/12/2019  . Neuropathy 03/12/2019  . Fatigue 03/12/2019  . Essential hypertension 11/19/2018  . Sore throat (viral) 07/18/2018  . Common cold 07/18/2018  . Hyperlipidemia with target LDL less than 100 04/19/2018  . Low back pain with bilateral sciatica 03/29/2018  . DOE (dyspnea on exertion) 07/28/2017  . Chest pain with low risk for cardiac etiology 07/28/2017  . Family history of premature coronary heart disease 07/28/2017    Past Medical History:  Diagnosis Date  . Acid reflux 07/2019  . Anemia 03/2018  . Anemia 02/2019  . Anxiety   . Hyperlipidemia   . Hypertension   . Iron deficiency anemia 03/2018  . Trichomoniasis   . UTI (urinary tract infection) during pregnancy   . Vitamin D deficiency 02/2019    Past Surgical History:  Procedure Laterality Date  . CARDIOPULMONARY EXERCISE TEST  07/2017    No regional WMA. No valvular lesions. cardiopulmonary limitation seen. Peak VO2 normal for workload achieved on submaximal test. Dyspnea most likely related to deconditioning & limitations due to weight. Normal Study from a Heart & Lung standpoint.  Marland Kitchen LAPAROSCOPIC TUBAL LIGATION Bilateral 02/06/2013   Procedure: LAPAROSCOPIC TUBAL LIGATION;  Surgeon: Kathreen Cosier, MD;  Location: [facility];   Service: Gynecology;  Laterality: Bilateral;  . TRANSTHORACIC ECHOCARDIOGRAM  07/2017   EF 60-65%.  No regional WMA. No valvular lesions.  . WISDOM TOOTH EXTRACTION      Current Status: Since her last office visit, she is doing well with no complaints. She denies visual changes, chest pain, cough, shortness of breath, heart palpitations, and falls. She has occasional headaches and dizziness with position changes. Denies severe headaches, confusion, seizures, double vision, and blurred vision, nausea and vomiting. Her anxiety is mild today. She denies fevers, chills, recent infections, weight loss, and night sweats.  Denies GI problems such as diarrhea, and constipation. She has no reports of blood in stools, dysuria and hematuria. She is taking all medications as prescribed. She continues to have chronic back pain, which she has completed Physical Therapy sessions.    Family History  Problem Relation Age of Onset  . Kidney disease Maternal Grandmother   . Heart failure Maternal Grandmother   . Hypertension Mother   . Asthma Sister   . Coronary artery disease Father        CABG - in 63s.  . Anesthesia problems Neg Hx   . Other Neg Hx     Social History   Socioeconomic History  . Marital status: Single    Spouse name: Not on file  . Number of children: 3  . Years of education: Not on file  . Highest education level: High school graduate  Occupational History  . Not on file  Tobacco Use  .  Smoking status: Former Smoker    Packs/day: 0.10    Years: 13.00    Pack years: 1.30    Types: Cigarettes    Quit date: 05/27/2017    Years since quitting: 2.6  . Smokeless tobacco: Never Used  Vaping Use  . Vaping Use: Never used  Substance and Sexual Activity  . Alcohol use: Yes    Comment: socially  . Drug use: No  . Sexual activity: Yes    Birth control/protection: None  Other Topics Concern  . Not on file  Social History Narrative   Disabled - (since childhood).   Social  Determinants of Health   Financial Resource Strain:   . Difficulty of Paying Living Expenses: Not on file  Food Insecurity:   . Worried About Programme researcher, broadcasting/film/video in the Last Year: Not on file  . Ran Out of Food in the Last Year: Not on file  Transportation Needs:   . Lack of Transportation (Medical): Not on file  . Lack of Transportation (Non-Medical): Not on file  Physical Activity:   . Days of Exercise per Week: Not on file  . Minutes of Exercise per Session: Not on file  Stress:   . Feeling of Stress : Not on file  Social Connections:   . Frequency of Communication with Friends and Family: Not on file  . Frequency of Social Gatherings with Friends and Family: Not on file  . Attends Religious Services: Not on file  . Active Member of Clubs or Organizations: Not on file  . Attends Banker Meetings: Not on file  . Marital Status: Not on file  Intimate Partner Violence:   . Fear of Current or Ex-Partner: Not on file  . Emotionally Abused: Not on file  . Physically Abused: Not on file  . Sexually Abused: Not on file    Outpatient Medications Prior to Visit  Medication Sig Dispense Refill  . diclofenac sodium (VOLTAREN) 1 % GEL Apply 4 g topically 4 (four) times daily. 1 Tube 2  . ferrous sulfate 325 (65 FE) MG tablet TAKE 1 TABLET BY MOUTH EVERY DAY 90 tablet 1  . norethindrone-ethinyl estradiol (LOESTRIN 1/20, 21,) 1-20 MG-MCG tablet Take 1 tablet by mouth daily. 1 Package 11  . Vitamin D, Ergocalciferol, (DRISDOL) 1.25 MG (50000 UNIT) CAPS capsule Take 1 capsule (50,000 Units total) by mouth every 7 (seven) days. 15 capsule 1  . methocarbamol (ROBAXIN-750) 750 MG tablet Take 1 tablet (750 mg total) by mouth 2 (two) times daily as needed for muscle spasms. 60 tablet 3  . omeprazole (PRILOSEC) 20 MG capsule Take 1 capsule (20 mg total) by mouth 2 (two) times daily before a meal. 60 capsule 6  . pravastatin (PRAVACHOL) 80 MG tablet TAKE 1 TABLET BY MOUTH EVERY DAY 90  tablet 3  . aspirin 81 MG EC tablet TAKE 1 TABLET BY MOUTH EVERY DAY (Patient not taking: Reported on 02/04/2020) 90 tablet 1  . ibuprofen (ADVIL,MOTRIN) 200 MG tablet Take 200 mg by mouth every 6 (six) hours as needed. (Patient not taking: Reported on 02/04/2020)    . pregabalin (LYRICA) 50 MG capsule TAKE 1 CAPSULE BY MOUTH EVERY DAY (Patient not taking: Reported on 02/04/2020) 30 capsule 6   No facility-administered medications prior to visit.    Allergies  Allergen Reactions  . Zantac [Ranitidine Hcl] Swelling and Rash    ROS Review of Systems  Constitutional: Negative.   HENT: Negative.   Eyes: Negative.   Respiratory:  Negative.   Cardiovascular: Negative.   Gastrointestinal: Positive for abdominal distention.  Endocrine: Negative.   Genitourinary: Negative.   Musculoskeletal: Positive for arthralgias (generalized joint pain) and back pain (chronic ).  Skin: Negative.   Allergic/Immunologic: Negative.   Neurological: Positive for dizziness (occasional) and headaches (occasional ).  Hematological: Negative.   Psychiatric/Behavioral: Negative.     Objective:    Physical Exam Vitals and nursing note reviewed.  Constitutional:      Appearance: Normal appearance.  HENT:     Head: Normocephalic and atraumatic.     Nose: Nose normal.     Mouth/Throat:     Mouth: Mucous membranes are moist.     Pharynx: Oropharynx is clear.  Cardiovascular:     Rate and Rhythm: Normal rate and regular rhythm.     Pulses: Normal pulses.     Heart sounds: Normal heart sounds.  Pulmonary:     Effort: Pulmonary effort is normal.     Breath sounds: Normal breath sounds.  Abdominal:     General: Bowel sounds are normal.     Palpations: Abdomen is soft.  Musculoskeletal:     Cervical back: Normal range of motion and neck supple.     Comments: Limited ROM in spine  Skin:    General: Skin is warm and dry.  Neurological:     General: No focal deficit present.     Mental Status: She is  alert and oriented to person, place, and time.  Psychiatric:        Mood and Affect: Mood normal.        Behavior: Behavior normal.        Thought Content: Thought content normal.        Judgment: Judgment normal.    BP 111/71 (BP Location: Right Arm, Patient Position: Sitting, Cuff Size: Normal)   Pulse (!) 59   Temp 97.7 F (36.5 C)   Resp 16   Ht 5\' 4"  (1.626 m)   Wt 152 lb 12.8 oz (69.3 kg)   LMP 01/22/2020 (Exact Date)   SpO2 100%   BMI 26.23 kg/m  Wt Readings from Last 3 Encounters:  02/04/20 152 lb 12.8 oz (69.3 kg)  09/20/19 166 lb 12.8 oz (75.7 kg)  09/10/19 169 lb (76.7 kg)     Health Maintenance Due  Topic Date Due  . Hepatitis C Screening  Never done  . COVID-19 Vaccine (2 - Pfizer 2-dose series) 11/20/2019  . INFLUENZA VACCINE  01/12/2020    There are no preventive care reminders to display for this patient.  Lab Results  Component Value Date   TSH 0.588 07/24/2019   Lab Results  Component Value Date   WBC 7.1 07/24/2019   HGB 12.2 07/24/2019   HCT 37.6 07/24/2019   MCV 90 07/24/2019   PLT 421 07/24/2019   Lab Results  Component Value Date   NA 144 07/24/2019   K 4.1 07/24/2019   CO2 25 07/24/2019   GLUCOSE 78 07/24/2019   BUN 8 07/24/2019   CREATININE 0.85 07/24/2019   BILITOT 0.2 07/24/2019   ALKPHOS 70 07/24/2019   AST 18 07/24/2019   ALT 26 07/24/2019   PROT 6.6 07/24/2019   ALBUMIN 3.9 07/24/2019   CALCIUM 8.8 07/24/2019   ANIONGAP 12 05/19/2019   Lab Results  Component Value Date   CHOL 202 (H) 07/24/2019   Lab Results  Component Value Date   HDL 44 07/24/2019   Lab Results  Component Value Date  LDLCALC 133 (H) 07/24/2019   Lab Results  Component Value Date   TRIG 137 07/24/2019   Lab Results  Component Value Date   CHOLHDL 4.6 (H) 07/24/2019   Lab Results  Component Value Date   HGBA1C 5.2 04/30/2019    Assessment & Plan:   1. Essential hypertension The current medical regimen is effective; blood  pressure is stable at 111/71 today; continue present plan and medications as prescribed. He will continue to take medications as prescribed, to decrease high sodium intake, excessive alcohol intake, increase potassium intake, smoking cessation, and increase physical activity of at least 30 minutes of cardio activity daily. He will continue to follow Heart Healthy or DASH diet. - Urinalysis Dipstick  2. Bilateral radiating leg pain - methocarbamol (ROBAXIN-750) 750 MG tablet; Take 1 tablet (750 mg total) by mouth 2 (two) times daily as needed for muscle spasms.  Dispense: 60 tablet; Refill: 6 - ibuprofen (ADVIL) 800 MG tablet; Take 1 tablet (800 mg total) by mouth every 8 (eight) hours as needed.  Dispense: 30 tablet; Refill: 3  3. Bilateral foot pain - methocarbamol (ROBAXIN-750) 750 MG tablet; Take 1 tablet (750 mg total) by mouth 2 (two) times daily as needed for muscle spasms.  Dispense: 60 tablet; Refill: 6  4. Gastroesophageal reflux disease without esophagitis - omeprazole (PRILOSEC) 20 MG capsule; Take 1 capsule (20 mg total) by mouth 2 (two) times daily before a meal.  Dispense: 60 capsule; Refill: 6  5. Hyperlipidemia with target LDL less than 100 - pravastatin (PRAVACHOL) 80 MG tablet; Take 1 tablet (80 mg total) by mouth daily.  Dispense: 90 tablet; Refill: 3  6. Anxiety - pregabalin (LYRICA) 50 MG capsule; TAKE 1 CAPSULE BY MOUTH EVERY DAY  Dispense: 30 capsule; Refill: 6  7. Neuropathy - pregabalin (LYRICA) 50 MG capsule; TAKE 1 CAPSULE BY MOUTH EVERY DAY  Dispense: 30 capsule; Refill: 6  8. Follow up She will follow up in 6 months.   Meds ordered this encounter  Medications  . methocarbamol (ROBAXIN-750) 750 MG tablet    Sig: Take 1 tablet (750 mg total) by mouth 2 (two) times daily as needed for muscle spasms.    Dispense:  60 tablet    Refill:  6  . omeprazole (PRILOSEC) 20 MG capsule    Sig: Take 1 capsule (20 mg total) by mouth 2 (two) times daily before a meal.     Dispense:  60 capsule    Refill:  6  . pravastatin (PRAVACHOL) 80 MG tablet    Sig: Take 1 tablet (80 mg total) by mouth daily.    Dispense:  90 tablet    Refill:  3  . pregabalin (LYRICA) 50 MG capsule    Sig: TAKE 1 CAPSULE BY MOUTH EVERY DAY    Dispense:  30 capsule    Refill:  6  . ibuprofen (ADVIL) 800 MG tablet    Sig: Take 1 tablet (800 mg total) by mouth every 8 (eight) hours as needed.    Dispense:  30 tablet    Refill:  3    Orders Placed This Encounter  Procedures  . Urinalysis Dipstick    Referral Orders  No referral(s) requested today   Raliegh IpNatalie Odelia Graciano,  MSN, FNP-BC [facility] [address]  [place], [place] [address] [phone]  Problem List Items Addressed This Visit      Cardiovascular and Mediastinum   Essential hypertension -  Primary   Relevant Medications   pravastatin (PRAVACHOL) 80 MG tablet   Other Relevant Orders   Urinalysis Dipstick (Completed)     Digestive   Gastroesophageal reflux disease without esophagitis   Relevant Medications   omeprazole (PRILOSEC) 20 MG capsule     Nervous and Auditory   Neuropathy   Relevant Medications   pregabalin (LYRICA) 50 MG capsule     Other   Anxiety   Relevant Medications   pregabalin (LYRICA) 50 MG capsule   Bilateral foot pain   Relevant Medications   methocarbamol (ROBAXIN-750) 750 MG tablet   Hyperlipidemia with target LDL less than 100 (Chronic)   Relevant Medications   pravastatin (PRAVACHOL) 80 MG tablet    Other Visit Diagnoses    Bilateral radiating leg pain       Relevant Medications   methocarbamol (ROBAXIN-750) 750 MG tablet   ibuprofen (ADVIL) 800 MG tablet   Follow up          Meds ordered this encounter  Medications  . methocarbamol (ROBAXIN-750) 750 MG tablet    Sig: Take 1 tablet (750 mg total) by mouth 2 (two) times daily as needed for muscle spasms.     Dispense:  60 tablet    Refill:  6  . omeprazole (PRILOSEC) 20 MG capsule    Sig: Take 1 capsule (20 mg total) by mouth 2 (two) times daily before a meal.    Dispense:  60 capsule    Refill:  6  . pravastatin (PRAVACHOL) 80 MG tablet    Sig: Take 1 tablet (80 mg total) by mouth daily.    Dispense:  90 tablet    Refill:  3  . pregabalin (LYRICA) 50 MG capsule    Sig: TAKE 1 CAPSULE BY MOUTH EVERY DAY    Dispense:  30 capsule    Refill:  6  . ibuprofen (ADVIL) 800 MG tablet    Sig: Take 1 tablet (800 mg total) by mouth every 8 (eight) hours as needed.    Dispense:  30 tablet    Refill:  3    Follow-up: Return in about 6 months (around 08/06/2020) for Labs/OV.    Kallie Locks, FNP

## 2020-03-14 ENCOUNTER — Other Ambulatory Visit: Payer: Self-pay | Admitting: Family Medicine

## 2020-03-14 DIAGNOSIS — M5410 Radiculopathy, site unspecified: Secondary | ICD-10-CM

## 2020-03-23 ENCOUNTER — Encounter: Payer: Self-pay | Admitting: Family Medicine

## 2020-03-23 ENCOUNTER — Ambulatory Visit (INDEPENDENT_AMBULATORY_CARE_PROVIDER_SITE_OTHER): Payer: Medicaid Other | Admitting: Family Medicine

## 2020-03-23 ENCOUNTER — Other Ambulatory Visit: Payer: Self-pay

## 2020-03-23 VITALS — BP 121/85 | HR 72 | Temp 97.9°F | Resp 16 | Ht 64.0 in | Wt 156.8 lb

## 2020-03-23 DIAGNOSIS — I10 Essential (primary) hypertension: Secondary | ICD-10-CM | POA: Diagnosis not present

## 2020-03-23 DIAGNOSIS — R8290 Unspecified abnormal findings in urine: Secondary | ICD-10-CM

## 2020-03-23 DIAGNOSIS — G8929 Other chronic pain: Secondary | ICD-10-CM

## 2020-03-23 DIAGNOSIS — M549 Dorsalgia, unspecified: Secondary | ICD-10-CM | POA: Diagnosis not present

## 2020-03-23 DIAGNOSIS — F419 Anxiety disorder, unspecified: Secondary | ICD-10-CM

## 2020-03-23 DIAGNOSIS — K219 Gastro-esophageal reflux disease without esophagitis: Secondary | ICD-10-CM

## 2020-03-23 DIAGNOSIS — G629 Polyneuropathy, unspecified: Secondary | ICD-10-CM

## 2020-03-23 DIAGNOSIS — Z09 Encounter for follow-up examination after completed treatment for conditions other than malignant neoplasm: Secondary | ICD-10-CM

## 2020-03-23 LAB — POCT URINALYSIS DIPSTICK
Bilirubin, UA: NEGATIVE
Blood, UA: NEGATIVE
Glucose, UA: NEGATIVE
Ketones, UA: NEGATIVE
Nitrite, UA: NEGATIVE
Protein, UA: NEGATIVE
Spec Grav, UA: <=1.005 — AB (ref 1.010–1.025)
Urobilinogen, UA: 0.2 E.U./dL
pH, UA: 6 (ref 5.0–8.0)

## 2020-03-23 MED ORDER — DICLOFENAC SODIUM 1 % EX GEL: 4.0000 g | Freq: Four times a day (QID) | CUTANEOUS | 6 refills | Status: DC

## 2020-03-23 MED ORDER — PREGABALIN 50 MG PO CAPS: ORAL_CAPSULE | ORAL | 6 refills | Status: DC

## 2020-03-23 NOTE — Progress Notes (Signed)
[facility]   Established Patient Office Visit  Subjective:  Patient ID: Andrea Stone, female    DOB: 1980-08-13  Age: 39 y.o. MRN: [id number]  CC:  Chief Complaint  Patient presents with  . Follow-up  . Back Pain    Pt states she is still experiencing some pain in lower back. X63yr.    HPI Andrea Stone is a 39 year old female who presents for Follow Up today.    Patient Active Problem List   Diagnosis Date Noted  . Anxiety 07/24/2019  . Gastroesophageal reflux disease without esophagitis 07/24/2019  . Bilateral foot pain 07/24/2019  . Alcohol withdrawal delirium (HCC) 05/17/2019  . Back pain 05/01/2019  . Anemia 03/12/2019  . Iron deficiency anemia 03/12/2019  . Neuropathy 03/12/2019  . Fatigue 03/12/2019  . Essential hypertension 11/19/2018  . Sore throat (viral) 07/18/2018  . Common cold 07/18/2018  . Hyperlipidemia with target LDL less than 100 04/19/2018  . Low back pain with bilateral sciatica 03/29/2018  . DOE (dyspnea on exertion) 07/28/2017  . Chest pain with low risk for cardiac etiology 07/28/2017  . Family history of premature coronary heart disease 07/28/2017    Current Status: Since her last office visit, she continues to have increasing bilateral leg pain, which she has been taking to Gabapentin as prescribed. She has not been taking Lyrica in the last 2 months though. She denies visual changes, chest pain, cough, shortness of breath, heart palpitations, and falls. She has occasional headaches and dizziness with position changes. Denies severe headaches, confusion, seizures, double vision, and blurred vision, nausea and vomiting. She denies fevers, chills, recent infections, weight loss, and night sweats. Denies GI problems such as diarrhea, and constipation. She has no reports of blood in stools, dysuria and hematuria. She is taking all medications as prescribed.   Past Medical History:  Diagnosis Date  .  Acid reflux 07/2019  . Anemia 03/2018  . Anemia 02/2019  . Anxiety   . Hyperlipidemia   . Hypertension   . Iron deficiency anemia 03/2018  . Trichomoniasis   . UTI (urinary tract infection) during pregnancy   . Vitamin D deficiency 02/2019    Past Surgical History:  Procedure Laterality Date  . CARDIOPULMONARY EXERCISE TEST  07/2017    No regional WMA. No valvular lesions. cardiopulmonary limitation seen. Peak VO2 normal for workload achieved on submaximal test. Dyspnea most likely related to deconditioning & limitations due to weight. Normal Study from a Heart & Lung standpoint.  Marland Kitchen LAPAROSCOPIC TUBAL LIGATION Bilateral 02/06/2013   Procedure: LAPAROSCOPIC TUBAL LIGATION;  Surgeon: Kathreen Cosier, MD;  Location: [facility];  Service: Gynecology;  Laterality: Bilateral;  . TRANSTHORACIC ECHOCARDIOGRAM  07/2017   EF 60-65%.  No regional WMA. No valvular lesions.  . WISDOM TOOTH EXTRACTION      Family History  Problem Relation Age of Onset  . Kidney disease Maternal Grandmother   . Heart failure Maternal Grandmother   . Hypertension Mother   . Asthma Sister   . Coronary artery disease Father        CABG - in 25s.  . Anesthesia problems Neg Hx   . Other Neg Hx     Social History   Socioeconomic History  . Marital status: Single    Spouse name: Not on file  . Number of children: 3  . Years of education: Not on file  . Highest education level: High school graduate  Occupational History  .  Not on file  Tobacco Use  . Smoking status: Former Smoker    Packs/day: 0.10    Years: 13.00    Pack years: 1.30    Types: Cigarettes    Quit date: 05/27/2017    Years since quitting: 2.8  . Smokeless tobacco: Never Used  Vaping Use  . Vaping Use: Never used  Substance and Sexual Activity  . Alcohol use: Yes    Comment: socially  . Drug use: No  . Sexual activity: Yes    Birth control/protection: None  Other Topics Concern  . Not on file  Social History Narrative   Disabled  - (since childhood).   Social Determinants of Health   Financial Resource Strain:   . Difficulty of Paying Living Expenses: Not on file  Food Insecurity:   . Worried About Programme researcher, broadcasting/film/video in the Last Year: Not on file  . Ran Out of Food in the Last Year: Not on file  Transportation Needs:   . Lack of Transportation (Medical): Not on file  . Lack of Transportation (Non-Medical): Not on file  Physical Activity:   . Days of Exercise per Week: Not on file  . Minutes of Exercise per Session: Not on file  Stress:   . Feeling of Stress : Not on file  Social Connections:   . Frequency of Communication with Friends and Family: Not on file  . Frequency of Social Gatherings with Friends and Family: Not on file  . Attends Religious Services: Not on file  . Active Member of Clubs or Organizations: Not on file  . Attends Banker Meetings: Not on file  . Marital Status: Not on file  Intimate Partner Violence:   . Fear of Current or Ex-Partner: Not on file  . Emotionally Abused: Not on file  . Physically Abused: Not on file  . Sexually Abused: Not on file    Outpatient Medications Prior to Visit  Medication Sig Dispense Refill  . aspirin 81 MG EC tablet TAKE 1 TABLET BY MOUTH EVERY DAY 90 tablet 1  . ibuprofen (ADVIL) 800 MG tablet Take 1 tablet (800 mg total) by mouth every 8 (eight) hours as needed. 30 tablet 3  . methocarbamol (ROBAXIN-750) 750 MG tablet Take 1 tablet (750 mg total) by mouth 2 (two) times daily as needed for muscle spasms. 60 tablet 6  . omeprazole (PRILOSEC) 20 MG capsule Take 1 capsule (20 mg total) by mouth 2 (two) times daily before a meal. 60 capsule 6  . pravastatin (PRAVACHOL) 80 MG tablet Take 1 tablet (80 mg total) by mouth daily. 90 tablet 3  . Vitamin D, Ergocalciferol, (DRISDOL) 1.25 MG (50000 UNIT) CAPS capsule Take 1 capsule (50,000 Units total) by mouth every 7 (seven) days. 15 capsule 1  . ferrous sulfate 325 (65 FE) MG tablet TAKE 1 TABLET  BY MOUTH EVERY DAY (Patient not taking: Reported on 03/23/2020) 90 tablet 1  . norethindrone-ethinyl estradiol (LOESTRIN 1/20, 21,) 1-20 MG-MCG tablet Take 1 tablet by mouth daily. (Patient not taking: Reported on 03/23/2020) 1 Package 11  . diclofenac sodium (VOLTAREN) 1 % GEL Apply 4 g topically 4 (four) times daily. (Patient not taking: Reported on 03/23/2020) 1 Tube 2  . pregabalin (LYRICA) 50 MG capsule TAKE 1 CAPSULE BY MOUTH EVERY DAY (Patient not taking: Reported on 03/23/2020) 30 capsule 6   No facility-administered medications prior to visit.    Allergies  Allergen Reactions  . Zantac [Ranitidine Hcl] Swelling and Rash  ROS Review of Systems  Constitutional: Negative.   HENT: Negative.   Eyes: Negative.   Respiratory: Negative.   Cardiovascular: Negative.   Gastrointestinal: Positive for abdominal distention (obese).  Endocrine: Negative.   Genitourinary: Negative.   Musculoskeletal: Positive for arthralgias (generalized joint pain).  Skin: Negative.   Allergic/Immunologic: Negative.   Neurological: Positive for dizziness (occasional ) and headaches (occasional ).  Hematological: Negative.   Psychiatric/Behavioral: Negative.       Objective:    Physical Exam Vitals and nursing note reviewed.  Constitutional:      Appearance: Normal appearance.  HENT:     Head: Normocephalic and atraumatic.     Nose: Nose normal.     Mouth/Throat:     Mouth: Mucous membranes are dry.     Pharynx: Oropharynx is clear.  Cardiovascular:     Rate and Rhythm: Normal rate and regular rhythm.     Pulses: Normal pulses.     Heart sounds: Normal heart sounds.  Pulmonary:     Effort: Pulmonary effort is normal.     Breath sounds: Normal breath sounds.  Abdominal:     General: Abdomen is flat. Bowel sounds are normal. There is distension.     Palpations: Abdomen is soft.  Musculoskeletal:        General: Normal range of motion.     Cervical back: Normal range of motion and neck  supple.  Skin:    General: Skin is warm and dry.  Neurological:     General: No focal deficit present.     Mental Status: She is alert and oriented to person, place, and time.  Psychiatric:        Mood and Affect: Mood normal.        Behavior: Behavior normal.        Thought Content: Thought content normal.        Judgment: Judgment normal.     BP 121/85 (BP Location: Left Arm, Patient Position: Sitting, Cuff Size: Normal)   Pulse 72   Temp 97.9 F (36.6 C)   Resp 16   Ht  (1.626 m)   Wt 156 lb 12.8 oz (71.1 kg)   LMP 03/13/2020 (Exact Date)   SpO2 100%   BMI 26.91 kg/m  Wt Readings from Last 3 Encounters:  03/23/20 156 lb 12.8 oz (71.1 kg)  02/04/20 152 lb 12.8 oz (69.3 kg)  09/20/19 166 lb 12.8 oz (75.7 kg)     Health Maintenance Due  Topic Date Due  . Hepatitis C Screening  Never done  . COVID-19 Vaccine (2 - Pfizer 2-dose series) 11/20/2019  . INFLUENZA VACCINE  01/12/2020    There are no preventive care reminders to display for this patient.  Lab Results  Component Value Date   TSH 0.588 07/24/2019   Lab Results  Component Value Date   WBC 7.1 07/24/2019   HGB 12.2 07/24/2019   HCT 37.6 07/24/2019   MCV 90 07/24/2019   PLT 421 07/24/2019   Lab Results  Component Value Date   NA 144 07/24/2019   K 4.1 07/24/2019   CO2 25 07/24/2019   GLUCOSE 78 07/24/2019   BUN 8 07/24/2019   CREATININE 0.85 07/24/2019   BILITOT 0.2 07/24/2019   ALKPHOS 70 07/24/2019   AST 18 07/24/2019   ALT 26 07/24/2019   PROT 6.6 07/24/2019   ALBUMIN 3.9 07/24/2019   CALCIUM 8.8 07/24/2019   ANIONGAP 12 05/19/2019   Lab Results  Component Value Date  CHOL 202 (H) 07/24/2019   Lab Results  Component Value Date   HDL 44 07/24/2019   Lab Results  Component Value Date   LDLCALC 133 (H) 07/24/2019   Lab Results  Component Value Date   TRIG 137 07/24/2019   Lab Results  Component Value Date   CHOLHDL 4.6 (H) 07/24/2019   Lab Results  Component Value  Date   HGBA1C 5.2 04/30/2019      Assessment & Plan:   1. Chronic back pain, unspecified back location, unspecified back pain laterality - diclofenac Sodium (VOLTAREN) 1 % GEL; Apply 4 g topically 4 (four) times daily.  Dispense: 100 g; Refill: 6  2. Essential hypertension The current medical regimen is effective; blood pressure is stable at 121/85 today; continue present plan and medications as prescribed. She will continue to take medications as prescribed, to decrease high sodium intake, excessive alcohol intake, increase potassium intake, smoking cessation, and increase physical activity of at least 30 minutes of cardio activity daily. She will continue to follow Heart Healthy or DASH diet. - Urinalysis Dipstick  3. Anxiety Stable.  - pregabalin (LYRICA) 50 MG capsule; TAKE 1 CAPSULE BY MOUTH EVERY DAY  Dispense: 30 capsule; Refill: 6  4. Gastroesophageal reflux disease without esophagitis  5. Neuropathy Restart Lyrica.  - pregabalin (LYRICA) 50 MG capsule; TAKE 1 CAPSULE BY MOUTH EVERY DAY  Dispense: 30 capsule; Refill: 6  6. Abnormal urinalysis - Urine Culture  7. Follow up She will follow up in 6 months.   Meds ordered this encounter  Medications  . pregabalin (LYRICA) 50 MG capsule    Sig: TAKE 1 CAPSULE BY MOUTH EVERY DAY    Dispense:  30 capsule    Refill:  6  . diclofenac Sodium (VOLTAREN) 1 % GEL    Sig: Apply 4 g topically 4 (four) times daily.    Dispense:  100 g    Refill:  6    Orders Placed This Encounter  Procedures  . Urine Culture  . Urinalysis Dipstick    Referral Orders  No referral(s) requested today    Raliegh Ip,  MSN, FNP-BC [facility] [address] [place], [place] [address] [phone] fax   Problem List Items Addressed This Visit      Cardiovascular and Mediastinum   Essential hypertension   Relevant Orders    Urinalysis Dipstick (Completed)     Digestive   Gastroesophageal reflux disease without esophagitis     Nervous and Auditory   Neuropathy   Relevant Medications   pregabalin (LYRICA) 50 MG capsule     Other   Anxiety   Relevant Medications   pregabalin (LYRICA) 50 MG capsule   Back pain - Primary   Relevant Medications   diclofenac Sodium (VOLTAREN) 1 % GEL    Other Visit Diagnoses    Abnormal urinalysis       Relevant Orders   Urine Culture   Follow up          Meds ordered this encounter  Medications  . pregabalin (LYRICA) 50 MG capsule    Sig: TAKE 1 CAPSULE BY MOUTH EVERY DAY    Dispense:  30 capsule    Refill:  6  . diclofenac Sodium (VOLTAREN) 1 % GEL    Sig: Apply 4 g topically 4 (four) times daily.    Dispense:  100 g    Refill:  6    Follow-up: Return  in about 4 months (around 07/24/2020).    Kallie LocksNatalie M Lavan Imes, FNP

## 2020-03-25 ENCOUNTER — Other Ambulatory Visit: Payer: Self-pay | Admitting: Family Medicine

## 2020-03-25 ENCOUNTER — Encounter: Payer: Self-pay | Admitting: Family Medicine

## 2020-03-25 DIAGNOSIS — N3000 Acute cystitis without hematuria: Secondary | ICD-10-CM

## 2020-03-25 LAB — URINE CULTURE

## 2020-03-25 MED ORDER — SULFAMETHOXAZOLE-TRIMETHOPRIM 800-160 MG PO TABS: 1.0000 | ORAL_TABLET | Freq: Two times a day (BID) | ORAL | 0 refills | Status: DC

## 2020-03-30 ENCOUNTER — Other Ambulatory Visit: Payer: Self-pay | Admitting: Family Medicine

## 2020-03-30 DIAGNOSIS — N3000 Acute cystitis without hematuria: Secondary | ICD-10-CM

## 2020-03-30 MED ORDER — SULFAMETHOXAZOLE-TRIMETHOPRIM 800-160 MG PO TABS: 1.0000 | ORAL_TABLET | Freq: Two times a day (BID) | ORAL | 0 refills | Status: DC

## 2020-04-22 ENCOUNTER — Encounter (HOSPITAL_COMMUNITY): Payer: Self-pay

## 2020-04-22 ENCOUNTER — Ambulatory Visit (HOSPITAL_COMMUNITY)
Admission: EM | Admit: 2020-04-22 | Discharge: 2020-04-22 | Disposition: A | Discharge status: Home / Self Care | Payer: Medicaid Other

## 2020-04-22 ENCOUNTER — Encounter: Payer: Self-pay | Admitting: Family Medicine

## 2020-04-22 ENCOUNTER — Other Ambulatory Visit: Payer: Self-pay | Admitting: Family Medicine

## 2020-04-22 ENCOUNTER — Other Ambulatory Visit: Payer: Self-pay

## 2020-04-22 DIAGNOSIS — B029 Zoster without complications: Secondary | ICD-10-CM

## 2020-04-22 DIAGNOSIS — G629 Polyneuropathy, unspecified: Secondary | ICD-10-CM

## 2020-04-22 DIAGNOSIS — G8929 Other chronic pain: Secondary | ICD-10-CM

## 2020-04-22 MED ORDER — PREDNISONE 10 MG (21) PO TBPK: ORAL_TABLET | ORAL | 0 refills | Status: DC

## 2020-04-22 MED ORDER — GABAPENTIN 300 MG PO CAPS: 300.0000 mg | ORAL_CAPSULE | Freq: Two times a day (BID) | ORAL | 3 refills | Status: DC

## 2020-04-22 MED ORDER — VALACYCLOVIR HCL 1 G PO TABS: 1000.0000 mg | ORAL_TABLET | Freq: Three times a day (TID) | ORAL | 0 refills | Status: DC

## 2020-04-22 NOTE — ED Provider Notes (Signed)
[facility]    CSN: [id number] Arrival date & time: 04/22/20  1046      History   Chief Complaint Chief Complaint  Patient presents with  . Rash    HPI Andrea Stone is a 39 y.o. female who presents with itchy rash on  R abdomen and R back since yesterday. Has been applying antibiotic ointment and alcohol without improvement. Prior to onset of rash she had been itching on her R mid abdomen last week. Denies being ill in the past few week, but has been a little more stressed. Has chicken pox as child     Past Medical History:  Diagnosis Date  . Acid reflux 07/2019  . Anemia 03/2018  . Anemia 02/2019  . Anxiety   . Hyperlipidemia   . Hypertension   . Iron deficiency anemia 03/2018  . Trichomoniasis   . UTI (urinary tract infection) during pregnancy   . Vitamin D deficiency 02/2019    Patient Active Problem List   Diagnosis Date Noted  . Anxiety 07/24/2019  . Gastroesophageal reflux disease without esophagitis 07/24/2019  . Bilateral foot pain 07/24/2019  . Alcohol withdrawal delirium (HCC) 05/17/2019  . Back pain 05/01/2019  . Anemia 03/12/2019  . Iron deficiency anemia 03/12/2019  . Neuropathy 03/12/2019  . Fatigue 03/12/2019  . Essential hypertension 11/19/2018  . Sore throat (viral) 07/18/2018  . Common cold 07/18/2018  . Hyperlipidemia with target LDL less than 100 04/19/2018  . Low back pain with bilateral sciatica 03/29/2018  . DOE (dyspnea on exertion) 07/28/2017  . Chest pain with low risk for cardiac etiology 07/28/2017  . Family history of premature coronary heart disease 07/28/2017    Past Surgical History:  Procedure Laterality Date  . CARDIOPULMONARY EXERCISE TEST  07/2017    No regional WMA. No valvular lesions. cardiopulmonary limitation seen. Peak VO2 normal for workload achieved on submaximal test. Dyspnea most likely related to deconditioning & limitations due to weight. Normal Study from a Heart & Lung standpoint.  Marland Kitchen LAPAROSCOPIC  TUBAL LIGATION Bilateral 02/06/2013   Procedure: LAPAROSCOPIC TUBAL LIGATION;  Surgeon: Kathreen Cosier, MD;  Location: [facility];  Service: Gynecology;  Laterality: Bilateral;  . TRANSTHORACIC ECHOCARDIOGRAM  07/2017   EF 60-65%.  No regional WMA. No valvular lesions.  . WISDOM TOOTH EXTRACTION      OB History    Gravida  3   Para  3   Term  3   Preterm  0   AB  0   Living  3     SAB  0   TAB  0   Ectopic  0   Multiple  0   Live Births  3            Home Medications    Prior to Admission medications   Medication Sig Start Date End Date Taking? Authorizing Provider  aspirin 81 MG EC tablet TAKE 1 TABLET BY MOUTH EVERY DAY 10/04/17  Yes Jegede, Olugbemiga E, MD  gabapentin (NEURONTIN) 300 MG capsule Take 300 mg by mouth 2 (two) times daily. 02/07/20  Yes [provider]  methocarbamol (ROBAXIN-750) 750 MG tablet Take 1 tablet (750 mg total) by mouth 2 (two) times daily as needed for muscle spasms. 02/04/20  Yes Kallie Locks, FNP  omeprazole (PRILOSEC) 20 MG capsule Take 1 capsule (20 mg total) by mouth 2 (two) times daily before a meal. 02/04/20  Yes Kallie Locks, FNP  pravastatin (PRAVACHOL) 80 MG tablet Take 1  tablet (80 mg total) by mouth daily. 02/04/20  Yes Kallie Locks, FNP  pregabalin (LYRICA) 50 MG capsule TAKE 1 CAPSULE BY MOUTH EVERY DAY 03/23/20  Yes Kallie Locks, FNP  diclofenac Sodium (VOLTAREN) 1 % GEL Apply 4 g topically 4 (four) times daily. 03/23/20   Kallie Locks, FNP  ferrous sulfate 325 (65 FE) MG tablet TAKE 1 TABLET BY MOUTH EVERY DAY Patient not taking: Reported on 03/23/2020 10/16/19   Kallie Locks, FNP  ibuprofen (ADVIL) 800 MG tablet Take 1 tablet (800 mg total) by mouth every 8 (eight) hours as needed. 02/04/20   Kallie Locks, FNP  norethindrone-ethinyl estradiol (LOESTRIN 1/20, 21,) 1-20 MG-MCG tablet Take 1 tablet by mouth daily. Patient not taking: Reported on 03/23/2020 07/24/19   Kallie Locks,  FNP  sulfamethoxazole-trimethoprim (BACTRIM DS) 800-160 MG tablet Take 1 tablet by mouth 2 (two) times daily. 03/30/20   Kallie Locks, FNP  Vitamin D, Ergocalciferol, (DRISDOL) 1.25 MG (50000 UNIT) CAPS capsule Take 1 capsule (50,000 Units total) by mouth every 7 (seven) days. 07/18/19   Kallie Locks, FNP    Family History Family History  Problem Relation Age of Onset  . Kidney disease Maternal Grandmother   . Heart failure Maternal Grandmother   . Hypertension Mother   . Asthma Sister   . Coronary artery disease Father        CABG - in 61s.  . Anesthesia problems Neg Hx   . Other Neg Hx     Social History Social History   Tobacco Use  . Smoking status: Former Smoker    Packs/day: 0.10    Years: 13.00    Pack years: 1.30    Types: Cigarettes    Quit date: 05/27/2017    Years since quitting: 2.9  . Smokeless tobacco: Never Used  Vaping Use  . Vaping Use: Never used  Substance Use Topics  . Alcohol use: Yes    Comment: socially  . Drug use: No     Allergies   Zantac [ranitidine hcl]   Review of Systems Review of Systems   Physical Exam Triage Vital Signs ED Triage Vitals  Enc Vitals Group     BP 04/22/20 1224 (!) 151/96     Pulse Rate 04/22/20 1137 65     Resp 04/22/20 1137 18     Temp 04/22/20 1224 98.7 F (37.1 C)     Temp Source 04/22/20 1224 Oral     SpO2 04/22/20 1137 98 %     Weight --      Height --      Head Circumference --      Peak Flow --      Pain Score 04/22/20 1220 0     Pain Loc --      Pain Edu? --      Excl. in GC? --    No data found.  Updated Vital Signs BP (!) 151/96 (BP Location: Left Arm)   Pulse 70   Temp 98.7 F (37.1 C) (Oral)   Resp 16   LMP 04/15/2020   SpO2 100%   Visual Acuity Right Eye Distance:   Left Eye Distance:   Bilateral Distance:    Right Eye Near:   Left Eye Near:    Bilateral Near:     Physical Exam Constitutional:      General: She is not in acute distress.    Appearance: She is  obese. She is not toxic-appearing.  HENT:     Right Ear: External ear normal.     Left Ear: External ear normal.  Eyes:     General: No scleral icterus.    Conjunctiva/sclera: Conjunctivae normal.  Pulmonary:     Effort: Pulmonary effort is normal.  Musculoskeletal:        General: Normal range of motion.     Cervical back: Neck supple.  Skin:    General: Skin is warm and dry.     Findings: Rash present.     Comments: R mid abdomen with few papules and a couple of them are open, has more on her R back along T12 dermatome. Has mild erythema around the rash on the back, but is not warm or painful. There are no rashes passed the midline.   Neurological:     Mental Status: She is alert and oriented to person, place, and time.     Gait: Gait normal.  Psychiatric:        Mood and Affect: Mood normal.        Behavior: Behavior normal.        Thought Content: Thought content normal.        Judgment: Judgment normal.      UC Treatments / Results  Labs (all labs ordered are listed, but only abnormal results are displayed) Labs Reviewed - No data to display  EKG   Radiology No results found.  Procedures Procedures (including critical care time)  Medications Ordered in UC Medications - No data to display  Initial Impression / Assessment and Plan / UC Course  I have reviewed the triage vital signs and the nursing notes. I believe she has shingles. I placed her on Valtrex and Prednisone taper to prevent post herpetic neuralgia.  Final Clinical Impressions(s) / UC Diagnoses   Final diagnoses:  None   Discharge Instructions   None    ED Prescriptions    None     PDMP not reviewed this encounter.   Garey Ham, PA-C 04/22/20 1251

## 2020-04-22 NOTE — ED Triage Notes (Signed)
Pt c/o itchy rash to abdomen and back onset yesterday. Denies SOB, n/v, abdominal pain, difficulty breathing or swallowing.  States has been applying antibiotic ointment and alcohol to rash w/o improvement.

## 2020-04-22 NOTE — ED Notes (Signed)
Pt denies difficulty breathing, trouble talking or swallowing, throat tightness.

## 2020-05-01 ENCOUNTER — Ambulatory Visit (INDEPENDENT_AMBULATORY_CARE_PROVIDER_SITE_OTHER): Payer: Medicaid Other | Admitting: Family Medicine

## 2020-05-01 ENCOUNTER — Other Ambulatory Visit: Payer: Self-pay

## 2020-05-01 ENCOUNTER — Encounter: Payer: Self-pay | Admitting: Family Medicine

## 2020-05-01 VITALS — BP 125/76 | HR 94 | Temp 99.1°F | Ht 64.0 in | Wt 163.8 lb

## 2020-05-01 DIAGNOSIS — M79671 Pain in right foot: Secondary | ICD-10-CM

## 2020-05-01 DIAGNOSIS — G629 Polyneuropathy, unspecified: Secondary | ICD-10-CM | POA: Diagnosis not present

## 2020-05-01 DIAGNOSIS — Z09 Encounter for follow-up examination after completed treatment for conditions other than malignant neoplasm: Secondary | ICD-10-CM

## 2020-05-01 DIAGNOSIS — I10 Essential (primary) hypertension: Secondary | ICD-10-CM | POA: Diagnosis not present

## 2020-05-01 DIAGNOSIS — E785 Hyperlipidemia, unspecified: Secondary | ICD-10-CM

## 2020-05-01 DIAGNOSIS — M5410 Radiculopathy, site unspecified: Secondary | ICD-10-CM | POA: Diagnosis not present

## 2020-05-01 DIAGNOSIS — M79672 Pain in left foot: Secondary | ICD-10-CM

## 2020-05-01 DIAGNOSIS — M549 Dorsalgia, unspecified: Secondary | ICD-10-CM

## 2020-05-01 DIAGNOSIS — Z309 Encounter for contraceptive management, unspecified: Secondary | ICD-10-CM

## 2020-05-01 DIAGNOSIS — K219 Gastro-esophageal reflux disease without esophagitis: Secondary | ICD-10-CM

## 2020-05-01 DIAGNOSIS — F419 Anxiety disorder, unspecified: Secondary | ICD-10-CM

## 2020-05-01 DIAGNOSIS — G8929 Other chronic pain: Secondary | ICD-10-CM

## 2020-05-01 MED ORDER — DICLOFENAC SODIUM 1 % EX GEL: 4.0000 g | Freq: Four times a day (QID) | CUTANEOUS | 6 refills | Status: AC

## 2020-05-01 MED ORDER — OMEPRAZOLE 20 MG PO CPDR: 20.0000 mg | DELAYED_RELEASE_CAPSULE | Freq: Two times a day (BID) | ORAL | 3 refills | Status: AC

## 2020-05-01 MED ORDER — VALACYCLOVIR HCL 1 G PO TABS: 1000.0000 mg | ORAL_TABLET | Freq: Three times a day (TID) | ORAL | 11 refills | Status: AC

## 2020-05-01 MED ORDER — PRAVASTATIN SODIUM 80 MG PO TABS: 80.0000 mg | ORAL_TABLET | Freq: Every day | ORAL | 3 refills | Status: DC

## 2020-05-01 MED ORDER — PREGABALIN 50 MG PO CAPS: ORAL_CAPSULE | ORAL | 3 refills | Status: DC

## 2020-05-01 MED ORDER — METHOCARBAMOL 750 MG PO TABS: 750.0000 mg | ORAL_TABLET | Freq: Two times a day (BID) | ORAL | 3 refills | Status: AC | PRN

## 2020-05-01 MED ORDER — IBUPROFEN 800 MG PO TABS: 800.0000 mg | ORAL_TABLET | Freq: Three times a day (TID) | ORAL | 6 refills | Status: AC | PRN

## 2020-05-01 MED ORDER — GABAPENTIN 300 MG PO CAPS: 300.0000 mg | ORAL_CAPSULE | Freq: Two times a day (BID) | ORAL | 3 refills | Status: DC

## 2020-05-01 NOTE — Progress Notes (Signed)
[facility]  Hospital Follow Up  Subjective:  Patient ID: Andrea Stone, female    DOB: 1981-04-04  Age: 39 y.o. MRN: [id number]  CC:  Chief Complaint  Patient presents with  . Follow-up    Pt states she thought she had a rsh turns out it was shingles.    HPI Andrea Stone is a 39 year old female who presents for Hospital Follow Up today.    Patient Active Problem List   Diagnosis Date Noted  . Anxiety 07/24/2019  . Gastroesophageal reflux disease without esophagitis 07/24/2019  . Bilateral foot pain 07/24/2019  . Alcohol withdrawal delirium (HCC) 05/17/2019  . Back pain 05/01/2019  . Anemia 03/12/2019  . Iron deficiency anemia 03/12/2019  . Neuropathy 03/12/2019  . Fatigue 03/12/2019  . Essential hypertension 11/19/2018  . Sore throat (viral) 07/18/2018  . Common cold 07/18/2018  . Hyperlipidemia with target LDL less than 100 04/19/2018  . Low back pain with bilateral sciatica 03/29/2018  . DOE (dyspnea on exertion) 07/28/2017  . Chest pain with low risk for cardiac etiology 07/28/2017  . Family history of premature coronary heart disease 07/28/2017   Current Status: Since her last office visit, she has had an Urgent Care visit for Shingles, which she has been treated for since 04/22/2020. Today, she is doing well with no complaints. She denies fevers, chills, fatigue, recent infections, weight loss, and night sweats. She has not had any headaches, visual changes, dizziness, and falls. No chest pain, heart palpitations, cough and shortness of breath reported. Denies GI problems such as nausea, vomiting, diarrhea, and constipation. She has no reports of blood in stools, dysuria and hematuria. No depression or anxiety, and denies suicidal ideations, homicidal ideations, or auditory hallucinations. She is taking all medications as prescribed. She denies pain today.   Past Medical History:  Diagnosis Date  . Acid reflux 07/2019    . Anemia 03/2018  . Anemia 02/2019  . Anxiety   . Hyperlipidemia   . Hypertension   . Iron deficiency anemia 03/2018  . Trichomoniasis   . UTI (urinary tract infection) during pregnancy   . Vitamin D deficiency 02/2019    Past Surgical History:  Procedure Laterality Date  . CARDIOPULMONARY EXERCISE TEST  07/2017    No regional WMA. No valvular lesions. cardiopulmonary limitation seen. Peak VO2 normal for workload achieved on submaximal test. Dyspnea most likely related to deconditioning & limitations due to weight. Normal Study from a Heart & Lung standpoint.  Marland Kitchen LAPAROSCOPIC TUBAL LIGATION Bilateral 02/06/2013   Procedure: LAPAROSCOPIC TUBAL LIGATION;  Surgeon: Kathreen Cosier, MD;  Location: [facility];  Service: Gynecology;  Laterality: Bilateral;  . TRANSTHORACIC ECHOCARDIOGRAM  07/2017   EF 60-65%.  No regional WMA. No valvular lesions.  . WISDOM TOOTH EXTRACTION      Family History  Problem Relation Age of Onset  . Kidney disease Maternal Grandmother   . Heart failure Maternal Grandmother   . Hypertension Mother   . Asthma Sister   . Coronary artery disease Father        CABG - in 15s.  . Anesthesia problems Neg Hx   . Other Neg Hx     Social History   Socioeconomic History  . Marital status: Single    Spouse name: Not on file  . Number of children: 3  . Years of education: Not on file  . Highest education level: High school graduate  Occupational History  .  Not on file  Tobacco Use  . Smoking status: Former Smoker    Packs/day: 0.10    Years: 13.00    Pack years: 1.30    Types: Cigarettes    Quit date: 05/27/2017    Years since quitting: 2.9  . Smokeless tobacco: Never Used  Vaping Use  . Vaping Use: Never used  Substance and Sexual Activity  . Alcohol use: Yes    Comment: socially  . Drug use: No  . Sexual activity: Yes    Birth control/protection: None  Other Topics Concern  . Not on file  Social History Narrative   Disabled - (since childhood).    Social Determinants of Health   Financial Resource Strain:   . Difficulty of Paying Living Expenses: Not on file  Food Insecurity:   . Worried About Programme researcher, broadcasting/film/videounning Out of Food in the Last Year: Not on file  . Ran Out of Food in the Last Year: Not on file  Transportation Needs:   . Lack of Transportation (Medical): Not on file  . Lack of Transportation (Non-Medical): Not on file  Physical Activity:   . Days of Exercise per Week: Not on file  . Minutes of Exercise per Session: Not on file  Stress:   . Feeling of Stress : Not on file  Social Connections:   . Frequency of Communication with Friends and Family: Not on file  . Frequency of Social Gatherings with Friends and Family: Not on file  . Attends Religious Services: Not on file  . Active Member of Clubs or Organizations: Not on file  . Attends BankerClub or Organization Meetings: Not on file  . Marital Status: Not on file  Intimate Partner Violence:   . Fear of Current or Ex-Partner: Not on file  . Emotionally Abused: Not on file  . Physically Abused: Not on file  . Sexually Abused: Not on file    Outpatient Medications Prior to Visit  Medication Sig Dispense Refill  . aspirin 81 MG EC tablet TAKE 1 TABLET BY MOUTH EVERY DAY 90 tablet 1  . ferrous sulfate 325 (65 FE) MG tablet TAKE 1 TABLET BY MOUTH EVERY DAY 90 tablet 1  . norethindrone-ethinyl estradiol (LOESTRIN 1/20, 21,) 1-20 MG-MCG tablet Take 1 tablet by mouth daily. 1 Package 11  . Vitamin D, Ergocalciferol, (DRISDOL) 1.25 MG (50000 UNIT) CAPS capsule Take 1 capsule (50,000 Units total) by mouth every 7 (seven) days. 15 capsule 1  . diclofenac Sodium (VOLTAREN) 1 % GEL Apply 4 g topically 4 (four) times daily. 100 g 6  . gabapentin (NEURONTIN) 300 MG capsule Take 1 capsule (300 mg total) by mouth 2 (two) times daily. 180 capsule 3  . ibuprofen (ADVIL) 800 MG tablet Take 1 tablet (800 mg total) by mouth every 8 (eight) hours as needed. 30 tablet 3  . methocarbamol (ROBAXIN-750)  750 MG tablet Take 1 tablet (750 mg total) by mouth 2 (two) times daily as needed for muscle spasms. 60 tablet 6  . omeprazole (PRILOSEC) 20 MG capsule Take 1 capsule (20 mg total) by mouth 2 (two) times daily before a meal. 60 capsule 6  . pravastatin (PRAVACHOL) 80 MG tablet Take 1 tablet (80 mg total) by mouth daily. 90 tablet 3  . valACYclovir (VALTREX) 1000 MG tablet Take 1 tablet (1,000 mg total) by mouth 3 (three) times daily for 10 days. 30 tablet 0  . predniSONE (STERAPRED UNI-PAK 21 TAB) 10 MG (21) TBPK tablet As directed per pack (Patient not  taking: Reported on 05/01/2020) 21 tablet 0  . pregabalin (LYRICA) 50 MG capsule TAKE 1 CAPSULE BY MOUTH EVERY DAY (Patient not taking: Reported on 05/01/2020) 30 capsule 6   No facility-administered medications prior to visit.    Allergies  Allergen Reactions  . Zantac [Ranitidine Hcl] Swelling and Rash    ROS Review of Systems  Constitutional: Negative.   HENT: Negative.   Eyes: Negative.   Respiratory: Negative.   Cardiovascular: Negative.   Gastrointestinal: Positive for abdominal distention (obese).  Endocrine: Negative.   Genitourinary: Negative.   Musculoskeletal: Positive for arthralgias (generalized. ).  Skin: Negative.   Allergic/Immunologic: Negative.   Neurological: Positive for dizziness (occasional ) and headaches (occasional ).  Hematological: Negative.   Psychiatric/Behavioral: Negative.       Objective:    Physical Exam Vitals and nursing note reviewed.  Constitutional:      Appearance: Normal appearance.  HENT:     Head: Normocephalic and atraumatic.     Nose: Nose normal.     Mouth/Throat:     Mouth: Mucous membranes are moist.     Pharynx: Oropharynx is clear.  Cardiovascular:     Rate and Rhythm: Normal rate.     Pulses: Normal pulses.     Heart sounds: Normal heart sounds.  Pulmonary:     Effort: Pulmonary effort is normal.     Breath sounds: Normal breath sounds.  Abdominal:     General:  Bowel sounds are normal. There is distension (occasional ).     Palpations: Abdomen is soft.  Musculoskeletal:        General: Normal range of motion.     Cervical back: Normal range of motion and neck supple.  Skin:    General: Skin is warm and dry.     Findings: Rash (shingles rash right flank area to right back area) present. Bruising: shingles rash.       Neurological:     General: No focal deficit present.     Mental Status: She is alert and oriented to person, place, and time.  Psychiatric:        Mood and Affect: Mood normal.        Behavior: Behavior normal.        Thought Content: Thought content normal.        Judgment: Judgment normal.     BP 125/76 (BP Location: Left Arm, Patient Position: Sitting, Cuff Size: Normal)   Pulse 94   Temp 99.1 F (37.3 C)   Ht  (1.626 m)   Wt 163 lb 12.8 oz (74.3 kg)   LMP 04/15/2020   BMI 28.12 kg/m  Wt Readings from Last 3 Encounters:  05/01/20 163 lb 12.8 oz (74.3 kg)  03/23/20 156 lb 12.8 oz (71.1 kg)  02/04/20 152 lb 12.8 oz (69.3 kg)     Health Maintenance Due  Topic Date Due  . Hepatitis C Screening  Never done  . COVID-19 Vaccine (2 - Pfizer 2-dose series) 11/20/2019  . INFLUENZA VACCINE  01/12/2020  . PAP SMEAR-Modifier  07/04/2020    There are no preventive care reminders to display for this patient.  Lab Results  Component Value Date   TSH 0.588 07/24/2019   Lab Results  Component Value Date   WBC 7.1 07/24/2019   HGB 12.2 07/24/2019   HCT 37.6 07/24/2019   MCV 90 07/24/2019   PLT 421 07/24/2019   Lab Results  Component Value Date   NA 144 07/24/2019   K  4.1 07/24/2019   CO2 25 07/24/2019   GLUCOSE 78 07/24/2019   BUN 8 07/24/2019   CREATININE 0.85 07/24/2019   BILITOT 0.2 07/24/2019   ALKPHOS 70 07/24/2019   AST 18 07/24/2019   ALT 26 07/24/2019   PROT 6.6 07/24/2019   ALBUMIN 3.9 07/24/2019   CALCIUM 8.8 07/24/2019   ANIONGAP 12 05/19/2019   Lab Results  Component Value Date    CHOL 202 (H) 07/24/2019   Lab Results  Component Value Date   HDL 44 07/24/2019   Lab Results  Component Value Date   LDLCALC 133 (H) 07/24/2019   Lab Results  Component Value Date   TRIG 137 07/24/2019   Lab Results  Component Value Date   CHOLHDL 4.6 (H) 07/24/2019   Lab Results  Component Value Date   HGBA1C 5.2 04/30/2019      Assessment & Plan:   1. Essential hypertension The current medical regimen is effective; blood pressure is stable at 125/76 today; continue present plan and medications as prescribed. She will continue to take medications as prescribed, to decrease high sodium intake, excessive alcohol intake, increase potassium intake, smoking cessation, and increase physical activity of at least 30 minutes of cardio activity daily. She will continue to follow Heart Healthy or DASH diet.  2. Chronic back pain, unspecified back location, unspecified back pain laterality - diclofenac Sodium (VOLTAREN) 1 % GEL; Apply 4 g topically 4 (four) times daily.  Dispense: 100 g; Refill: 6 - gabapentin (NEURONTIN) 300 MG capsule; Take 1 capsule (300 mg total) by mouth 2 (two) times daily.  Dispense: 180 capsule; Refill: 3  3. Neuropathy - gabapentin (NEURONTIN) 300 MG capsule; Take 1 capsule (300 mg total) by mouth 2 (two) times daily.  Dispense: 180 capsule; Refill: 3 - pregabalin (LYRICA) 50 MG capsule; TAKE 1 CAPSULE BY MOUTH EVERY DAY  Dispense: 90 capsule; Refill: 3  4. Bilateral radiating leg pain - methocarbamol (ROBAXIN-750) 750 MG tablet; Take 1 tablet (750 mg total) by mouth 2 (two) times daily as needed for muscle spasms.  Dispense: 180 tablet; Refill: 3 - ibuprofen (ADVIL) 800 MG tablet; Take 1 tablet (800 mg total) by mouth every 8 (eight) hours as needed.  Dispense: 30 tablet; Refill: 6  5. Bilateral foot pain - methocarbamol (ROBAXIN-750) 750 MG tablet; Take 1 tablet (750 mg total) by mouth 2 (two) times daily as needed for muscle spasms.  Dispense: 180 tablet;  Refill: 3  6. Encounter for contraceptive management, unspecified type  7. Gastroesophageal reflux disease without esophagitis - omeprazole (PRILOSEC) 20 MG capsule; Take 1 capsule (20 mg total) by mouth 2 (two) times daily before a meal.  Dispense: 180 capsule; Refill: 3  8. Hyperlipidemia with target LDL less than 100 - pravastatin (PRAVACHOL) 80 MG tablet; Take 1 tablet (80 mg total) by mouth daily.  Dispense: 90 tablet; Refill: 3  9. Anxiety Stable today. - pregabalin (LYRICA) 50 MG capsule; TAKE 1 CAPSULE BY MOUTH EVERY DAY  Dispense: 90 capsule; Refill: 3  10. Follow up She will follow up in 6 months.   Meds ordered this encounter  Medications  . diclofenac Sodium (VOLTAREN) 1 % GEL    Sig: Apply 4 g topically 4 (four) times daily.    Dispense:  100 g    Refill:  6  . gabapentin (NEURONTIN) 300 MG capsule    Sig: Take 1 capsule (300 mg total) by mouth 2 (two) times daily.    Dispense:  180 capsule  Refill:  3  . methocarbamol (ROBAXIN-750) 750 MG tablet    Sig: Take 1 tablet (750 mg total) by mouth 2 (two) times daily as needed for muscle spasms.    Dispense:  180 tablet    Refill:  3  . ibuprofen (ADVIL) 800 MG tablet    Sig: Take 1 tablet (800 mg total) by mouth every 8 (eight) hours as needed.    Dispense:  30 tablet    Refill:  6  . omeprazole (PRILOSEC) 20 MG capsule    Sig: Take 1 capsule (20 mg total) by mouth 2 (two) times daily before a meal.    Dispense:  180 capsule    Refill:  3  . pravastatin (PRAVACHOL) 80 MG tablet    Sig: Take 1 tablet (80 mg total) by mouth daily.    Dispense:  90 tablet    Refill:  3  . valACYclovir (VALTREX) 1000 MG tablet    Sig: Take 1 tablet (1,000 mg total) by mouth 3 (three) times daily.    Dispense:  90 tablet    Refill:  11  . pregabalin (LYRICA) 50 MG capsule    Sig: TAKE 1 CAPSULE BY MOUTH EVERY DAY    Dispense:  90 capsule    Refill:  3    No orders of the defined types were placed in this  encounter.   Referral Orders  No referral(s) requested today    Raliegh Ip,  MSN, FNP-BC [facility] [address] [place], [place] [address] [phone]   Problem List Items Addressed This Visit      Cardiovascular and Mediastinum   Essential hypertension - Primary   Relevant Medications   pravastatin (PRAVACHOL) 80 MG tablet     Digestive   Gastroesophageal reflux disease without esophagitis   Relevant Medications   omeprazole (PRILOSEC) 20 MG capsule     Nervous and Auditory   Neuropathy   Relevant Medications   gabapentin (NEURONTIN) 300 MG capsule   pregabalin (LYRICA) 50 MG capsule     Other   Anxiety   Relevant Medications   pregabalin (LYRICA) 50 MG capsule   Back pain   Relevant Medications   diclofenac Sodium (VOLTAREN) 1 % GEL   gabapentin (NEURONTIN) 300 MG capsule   methocarbamol (ROBAXIN-750) 750 MG tablet   ibuprofen (ADVIL) 800 MG tablet   Bilateral foot pain   Relevant Medications   methocarbamol (ROBAXIN-750) 750 MG tablet   Hyperlipidemia with target LDL less than 100 (Chronic)   Relevant Medications   pravastatin (PRAVACHOL) 80 MG tablet    Other Visit Diagnoses    Bilateral radiating leg pain       Relevant Medications   methocarbamol (ROBAXIN-750) 750 MG tablet   ibuprofen (ADVIL) 800 MG tablet   Encounter for contraceptive management, unspecified type       Follow up          Meds ordered this encounter  Medications  . diclofenac Sodium (VOLTAREN) 1 % GEL    Sig: Apply 4 g topically 4 (four) times daily.    Dispense:  100 g    Refill:  6  . gabapentin (NEURONTIN) 300 MG capsule    Sig: Take 1 capsule (300 mg total) by mouth 2 (two) times daily.    Dispense:  180 capsule    Refill:  3  . methocarbamol (ROBAXIN-750) 750 MG tablet    Sig: Take  1 tablet (750 mg total) by mouth 2 (two) times daily as needed for muscle  spasms.    Dispense:  180 tablet    Refill:  3  . ibuprofen (ADVIL) 800 MG tablet    Sig: Take 1 tablet (800 mg total) by mouth every 8 (eight) hours as needed.    Dispense:  30 tablet    Refill:  6  . omeprazole (PRILOSEC) 20 MG capsule    Sig: Take 1 capsule (20 mg total) by mouth 2 (two) times daily before a meal.    Dispense:  180 capsule    Refill:  3  . pravastatin (PRAVACHOL) 80 MG tablet    Sig: Take 1 tablet (80 mg total) by mouth daily.    Dispense:  90 tablet    Refill:  3  . valACYclovir (VALTREX) 1000 MG tablet    Sig: Take 1 tablet (1,000 mg total) by mouth 3 (three) times daily.    Dispense:  90 tablet    Refill:  11  . pregabalin (LYRICA) 50 MG capsule    Sig: TAKE 1 CAPSULE BY MOUTH EVERY DAY    Dispense:  90 capsule    Refill:  3    Follow-up: No follow-ups on file.    Kallie Locks, FNP

## 2020-05-05 ENCOUNTER — Encounter: Payer: Self-pay | Admitting: Family Medicine

## 2020-05-28 ENCOUNTER — Encounter: Payer: Self-pay | Admitting: Family Medicine

## 2020-06-03 ENCOUNTER — Other Ambulatory Visit: Payer: Self-pay | Admitting: Family Medicine

## 2020-06-03 DIAGNOSIS — E559 Vitamin D deficiency, unspecified: Secondary | ICD-10-CM

## 2020-06-24 ENCOUNTER — Ambulatory Visit: Payer: Self-pay | Admitting: Family Medicine

## 2020-07-29 ENCOUNTER — Other Ambulatory Visit: Payer: Self-pay | Admitting: Family Medicine

## 2020-07-29 DIAGNOSIS — Z309 Encounter for contraceptive management, unspecified: Secondary | ICD-10-CM

## 2020-08-05 ENCOUNTER — Ambulatory Visit: Payer: Self-pay | Admitting: Family Medicine

## 2020-08-19 ENCOUNTER — Ambulatory Visit: Payer: Self-pay | Admitting: Family Medicine

## 2020-09-01 ENCOUNTER — Ambulatory Visit: Payer: Self-pay | Admitting: Family Medicine

## 2020-09-01 NOTE — Progress Notes (Deleted)
Patient no-showed today's appointment; provider notified for review of record.

## 2021-05-01 IMAGING — CT CT HEAD W/O CM
3 of 6 series · 16 of 47 positions shown, 19 images · non-contrast
Comparison: None.

CLINICAL DATA: Head trauma headache altered

EXAM:
CT HEAD WITHOUT CONTRAST
TECHNIQUE: Contiguous axial images were obtained from the base of the skull
through the vertex without intravenous contrast.

[Series 2: head wo · axial · 0.47mm/px · z∈[+1510,+1655]mm · 11 of 33 slices shown, 14 images]
[im 2/33  brain]
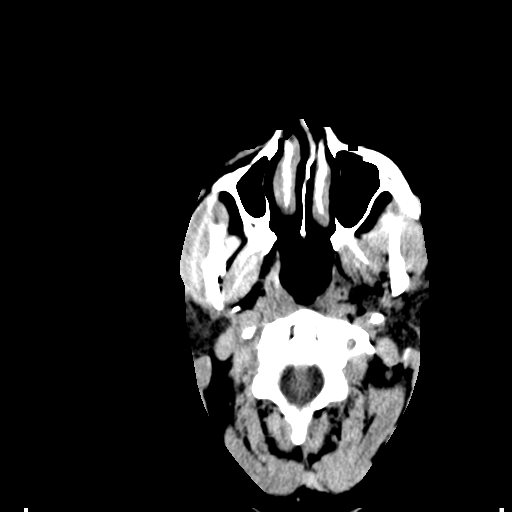
[im 2/33  bone]
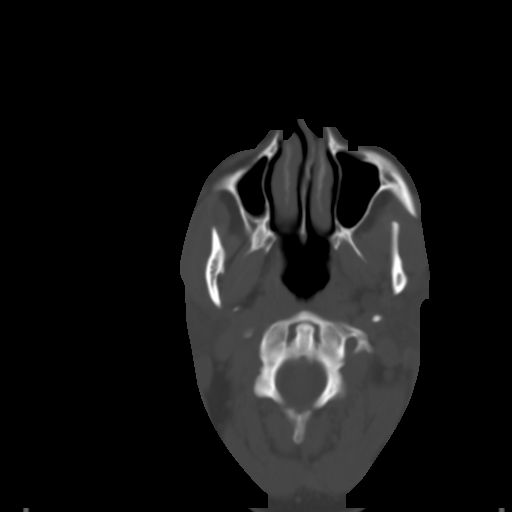
[im 6/33  brain]
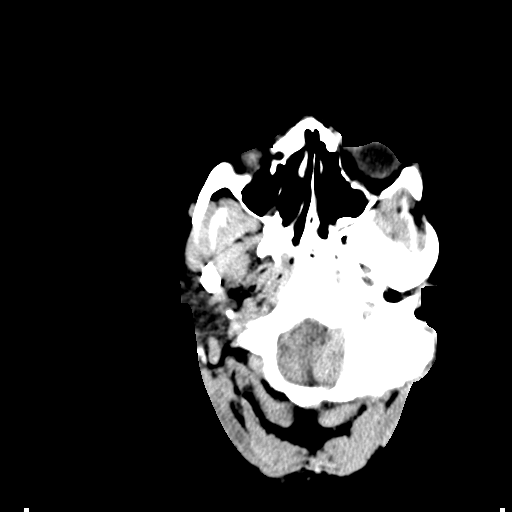
[im 8/33  brain]
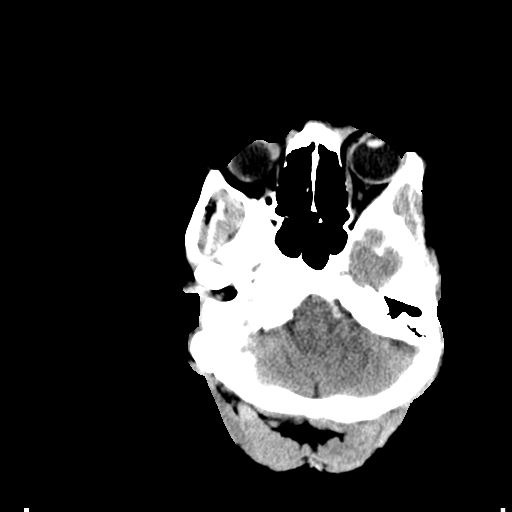
[im 11/33  brain]
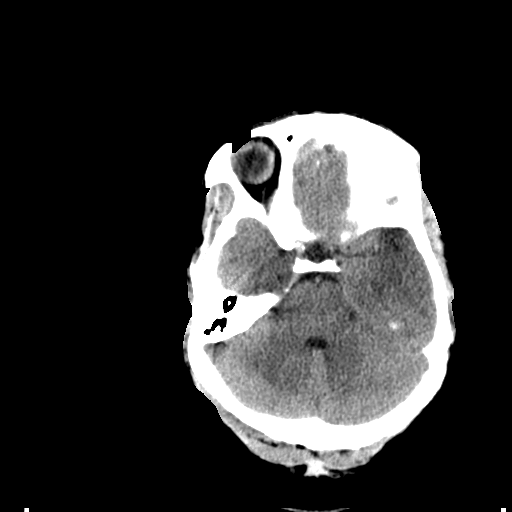
[im 13/33  brain]
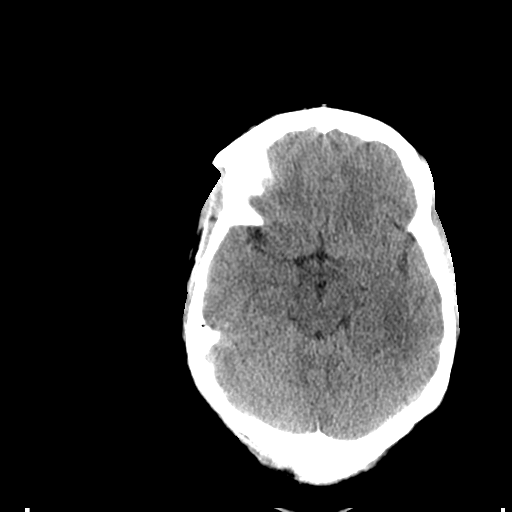
[im 13/33  bone]
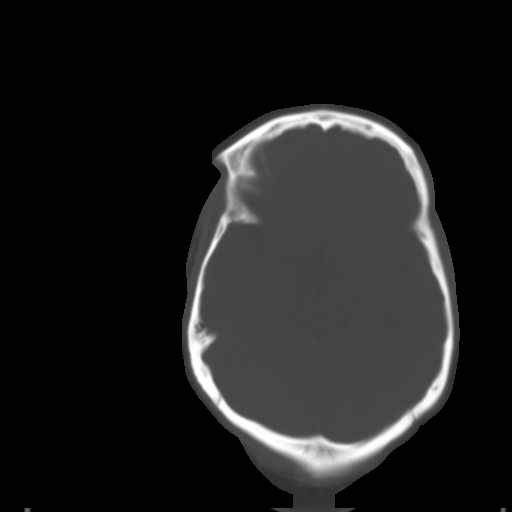
[im 17/33  brain]
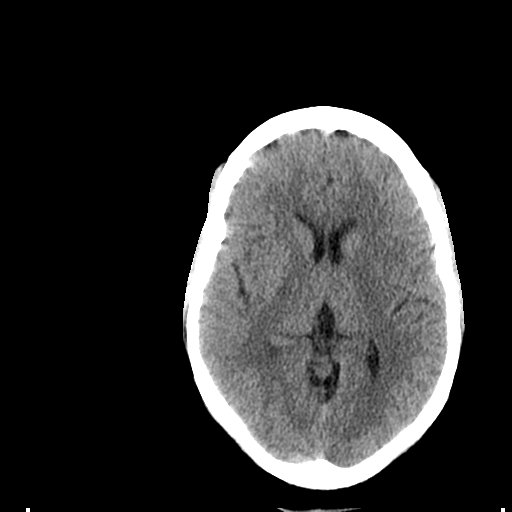
[im 20/33  brain]
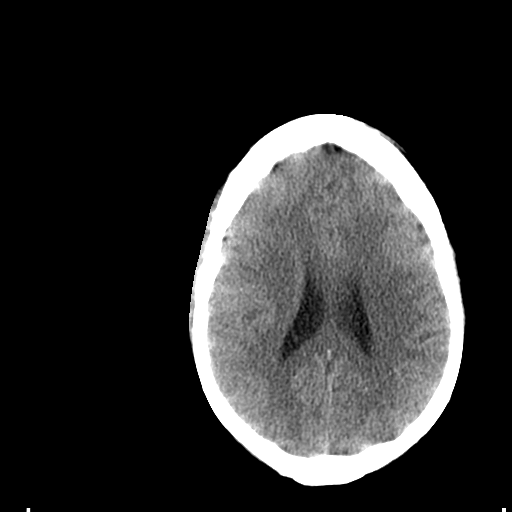
[im 22/33  brain]
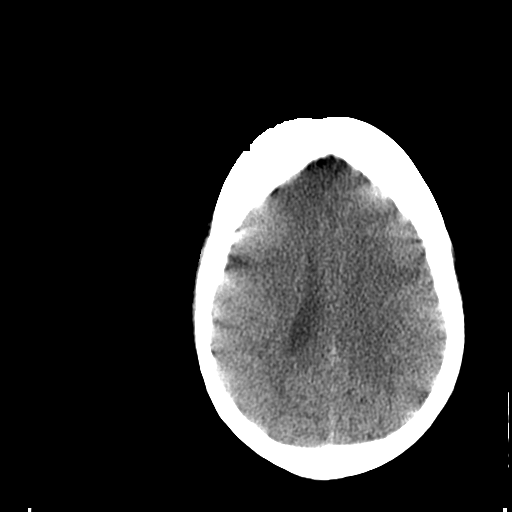
[im 25/33  brain]
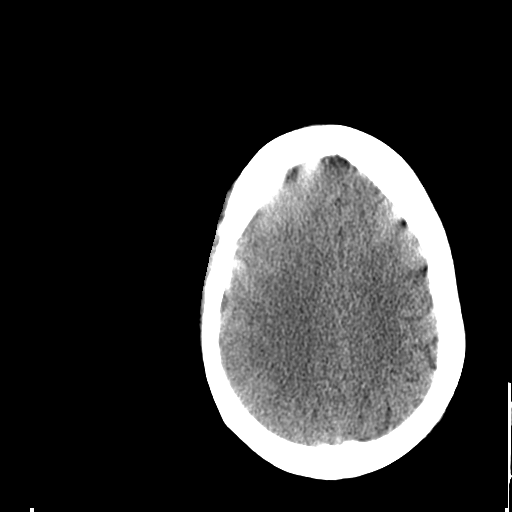
[im 25/33  bone]
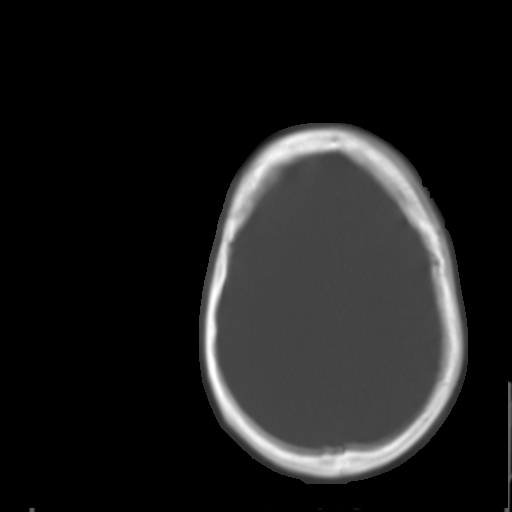
[im 27/33  brain]
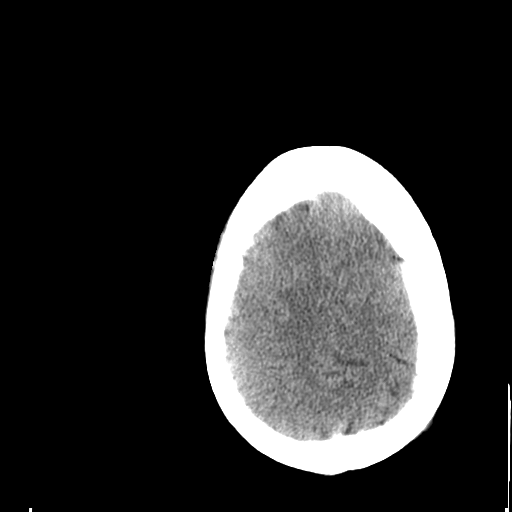
[im 31/33  brain]
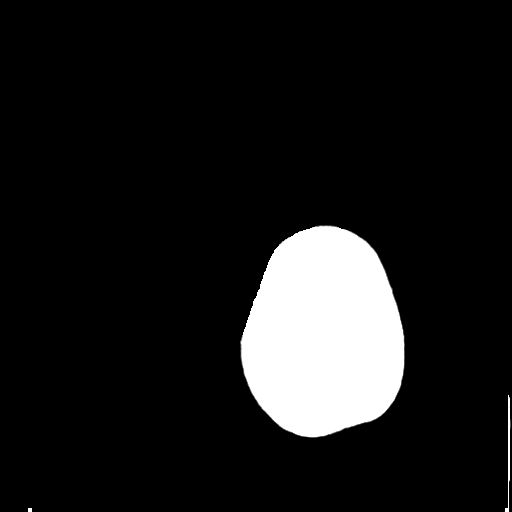

[Series 8: coronal soft tissue · coronal · 0.29mm/px · 3 of 62 slices shown]
[im 16/62  brain]
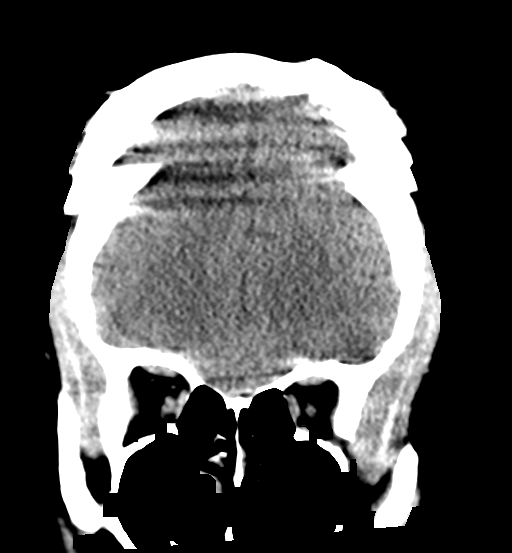
[im 31/62  brain]
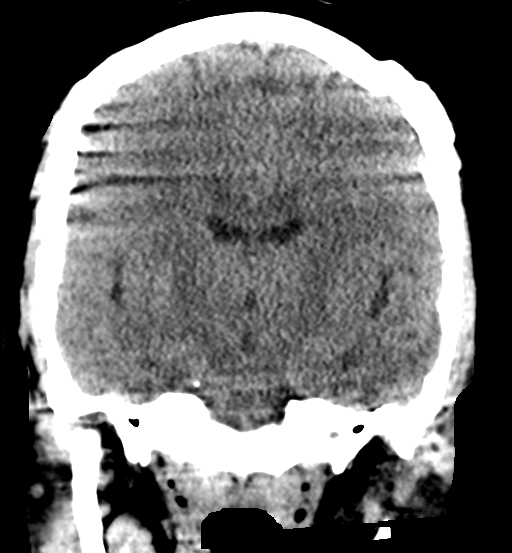
[im 46/62  brain]
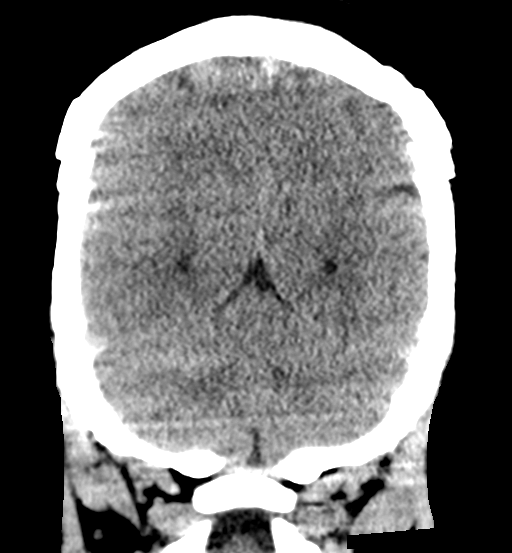

[Series 10: sagittal soft tissue · sagittal · 0.32mm/px · 2 of 49 slices shown]
[im 17/49  brain]
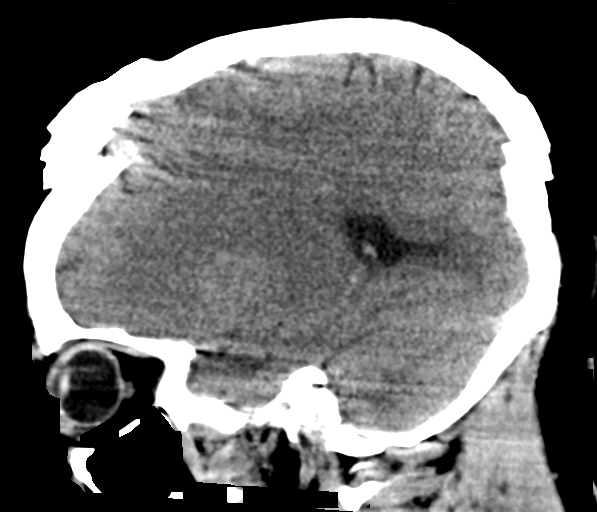
[im 33/49  brain]
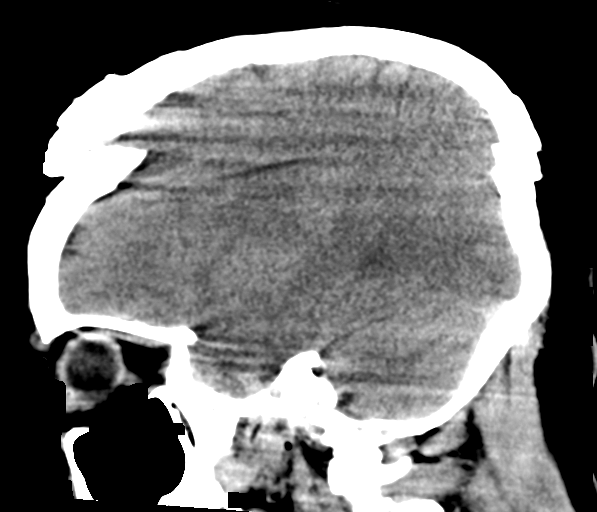

[16 of 47 positions shown; findings below may reference images not displayed]

FINDINGS: Brain: Mild motion degradation. No acute territorial infarction,
hemorrhage or intracranial mass. Normal ventricle size

Vascular: No hyperdense vessel or unexpected calcification.

Skull: Normal. Negative for fracture or focal lesion.

Sinuses/Orbits: No acute finding.

Other: None.
IMPRESSION: Negative non contrasted CT appearance of the brain

## 2021-08-11 ENCOUNTER — Other Ambulatory Visit: Payer: Self-pay

## 2022-04-08 ENCOUNTER — Ambulatory Visit (HOSPITAL_COMMUNITY)
Admission: EM | Admit: 2022-04-08 | Discharge: 2022-04-08 | Disposition: A | Discharge status: Home / Self Care | Payer: Medicaid Other | Attending: Family Medicine | Admitting: Family Medicine

## 2022-04-08 ENCOUNTER — Encounter (HOSPITAL_COMMUNITY): Payer: Self-pay

## 2022-04-08 DIAGNOSIS — R590 Localized enlarged lymph nodes: Secondary | ICD-10-CM

## 2022-04-08 MED ORDER — CEPHALEXIN 500 MG PO CAPS: 500.0000 mg | ORAL_CAPSULE | Freq: Three times a day (TID) | ORAL | 0 refills | Status: AC

## 2022-04-08 NOTE — ED Provider Notes (Signed)
[facility]    CSN: [id number] Arrival date & time: 04/08/22  1517      History   Chief Complaint Chief Complaint  Patient presents with   Cyst    HPI Andrea Stone is a 41 y.o. female.   She is here for a knot under the chin on the right side.  Noted it yesterday when she woke.  Painful to touch, painful to lay on it.  No pain with movement.  Has not gotten any bigger since yesterday.  She did take an asa without any help.  No other URI symptoms noted.  No dental issues/pain.        Past Medical History:  Diagnosis Date   Acid reflux 07/2019   Anemia 03/2018   Anemia 02/2019   Anxiety    Hyperlipidemia    Hypertension    Iron deficiency anemia 03/2018   Trichomoniasis    UTI (urinary tract infection) during pregnancy    Vitamin D deficiency 02/2019    Patient Active Problem List   Diagnosis Date Noted   Anxiety 07/24/2019   Gastroesophageal reflux disease without esophagitis 07/24/2019   Bilateral foot pain 07/24/2019   Alcohol withdrawal delirium (HCC) 05/17/2019   Back pain 05/01/2019   Anemia 03/12/2019   Iron deficiency anemia 03/12/2019   Neuropathy 03/12/2019   Fatigue 03/12/2019   Essential hypertension 11/19/2018   Sore throat (viral) 07/18/2018   Common cold 07/18/2018   Hyperlipidemia with target LDL less than 100 04/19/2018   Low back pain with bilateral sciatica 03/29/2018   DOE (dyspnea on exertion) 07/28/2017   Chest pain with low risk for cardiac etiology 07/28/2017   Family history of premature coronary heart disease 07/28/2017    Past Surgical History:  Procedure Laterality Date   CARDIOPULMONARY EXERCISE TEST  07/2017    No regional WMA. No valvular lesions. cardiopulmonary limitation seen. Peak VO2 normal for workload achieved on submaximal test. Dyspnea most likely related to deconditioning & limitations due to weight. Normal Study from a Heart & Lung standpoint.   LAPAROSCOPIC TUBAL LIGATION Bilateral 02/06/2013    Procedure: LAPAROSCOPIC TUBAL LIGATION;  Surgeon: Kathreen Cosier, MD;  Location: [facility];  Service: Gynecology;  Laterality: Bilateral;   TRANSTHORACIC ECHOCARDIOGRAM  07/2017   EF 60-65%.  No regional WMA. No valvular lesions.   WISDOM TOOTH EXTRACTION      OB History     Gravida  3   Para  3   Term  3   Preterm  0   AB  0   Living  3      SAB  0   IAB  0   Ectopic  0   Multiple  0   Live Births  3            Home Medications    Prior to Admission medications   Medication Sig Start Date End Date Taking? Authorizing Provider  aspirin 81 MG EC tablet TAKE 1 TABLET BY MOUTH EVERY DAY 10/04/17  Yes Jegede, Olugbemiga E, MD  ferrous sulfate 325 (65 FE) MG tablet TAKE 1 TABLET BY MOUTH EVERY DAY 10/16/19  Yes Kallie Locks, FNP  ibuprofen (ADVIL) 800 MG tablet Take 1 tablet (800 mg total) by mouth every 8 (eight) hours as needed. 05/01/20  Yes Kallie Locks, FNP  diclofenac Sodium (VOLTAREN) 1 % GEL Apply 4 g topically 4 (four) times daily. 05/01/20   Kallie Locks, FNP  gabapentin (NEURONTIN) 300 MG capsule Take  1 capsule (300 mg total) by mouth 2 (two) times daily. 05/01/20   Kallie Locks, FNP  methocarbamol (ROBAXIN-750) 750 MG tablet Take 1 tablet (750 mg total) by mouth 2 (two) times daily as needed for muscle spasms. 05/01/20   Kallie Locks, FNP  norethindrone-ethinyl estradiol (LOESTRIN) 1-20 MG-MCG tablet TAKE ONE TABLET BY MOUTH EVERY DAY 07/29/20   Kallie Locks, FNP  omeprazole (PRILOSEC) 20 MG capsule Take 1 capsule (20 mg total) by mouth 2 (two) times daily before a meal. 05/01/20   Kallie Locks, FNP  pravastatin (PRAVACHOL) 80 MG tablet Take 1 tablet (80 mg total) by mouth daily. 05/01/20   Kallie Locks, FNP  pregabalin (LYRICA) 50 MG capsule TAKE 1 CAPSULE BY MOUTH EVERY DAY 05/01/20   Kallie Locks, FNP  Vitamin D, Ergocalciferol, (DRISDOL) 1.25 MG (50000 UNIT) CAPS capsule TAKE ONE CAPSULE BY MOUTH EVERY SEVEN DAYS  06/03/20   Kallie Locks, FNP    Family History Family History  Problem Relation Age of Onset   Kidney disease Maternal Grandmother    Heart failure Maternal Grandmother    Hypertension Mother    Asthma Sister    Coronary artery disease Father        CABG - in 32s.   Anesthesia problems Neg Hx    Other Neg Hx     Social History Social History   Tobacco Use   Smoking status: Former    Packs/day: 0.10    Years: 13.00    Total pack years: 1.30    Types: Cigarettes    Quit date: 05/27/2017    Years since quitting: 4.8   Smokeless tobacco: Never  Vaping Use   Vaping Use: Never used  Substance Use Topics   Alcohol use: Yes    Comment: socially   Drug use: No     Allergies   Zantac [ranitidine hcl]   Review of Systems Review of Systems  Constitutional: Negative.   HENT: Negative.    Respiratory: Negative.    Cardiovascular: Negative.   Gastrointestinal: Negative.   Musculoskeletal: Negative.      Physical Exam Triage Vital Signs ED Triage Vitals  Enc Vitals Group     BP 04/08/22 1011 106/72     Pulse Rate 04/08/22 1011 70     Resp 04/08/22 1011 16     Temp 04/08/22 1011 97.8 F (36.6 C)     Temp Source 04/08/22 1011 Oral     SpO2 04/08/22 1011 99 %     Weight --      Height --      Head Circumference --      Peak Flow --      Pain Score 04/08/22 1010 5     Pain Loc --      Pain Edu? --      Excl. in GC? --    No data found.  Updated Vital Signs BP 106/72 (BP Location: Right Arm)   Pulse 70   Temp 97.8 F (36.6 C) (Oral)   Resp 16   LMP 03/28/2022 (Approximate)   SpO2 99%   Visual Acuity Right Eye Distance:   Left Eye Distance:   Bilateral Distance:    Right Eye Near:   Left Eye Near:    Bilateral Near:     Physical Exam Constitutional:      Appearance: Normal appearance.  HENT:     Head: Normocephalic.     Mouth/Throat:     Mouth:  Mucous membranes are moist.     Pharynx: No oropharyngeal exudate or posterior oropharyngeal  erythema.  Neck:     Comments: Midline, just under the chin is a small, moveable cyst/lymph node;  no surrounding redness or erythema;  no TTP elsewhere.  No swelling noted.  Cardiovascular:     Rate and Rhythm: Normal rate and regular rhythm.  Musculoskeletal:     Cervical back: Neck supple.  Neurological:     General: No focal deficit present.     Mental Status: She is alert.  Psychiatric:        Mood and Affect: Mood normal.      UC Treatments / Results  Labs (all labs ordered are listed, but only abnormal results are displayed) Labs Reviewed - No data to display  EKG   Radiology No results found.  Procedures Procedures (including critical care time)  Medications Ordered in UC Medications - No data to display  Initial Impression / Assessment and Plan / UC Course  I have reviewed the triage vital signs and the nursing notes.  Pertinent labs & imaging results that were available during my care of the patient were reviewed by me and considered in my medical decision making (see chart for details).    Final Clinical Impressions(s) / UC Diagnoses   Final diagnoses:  Enlarged lymph node in neck     Discharge Instructions      You were seen today for a lump/lymph node under the chin.  I have sent out a 7 day course of keflex to see if helps this resolve.  I also recommend motrin/ibuprofen 600mg  to help with pain/discomfort.  If this does not improve then please follow up with your primary care provider as you may need further testing that we cannot perform here.     ED Prescriptions     Medication Sig Dispense Auth. Provider   cephALEXin (KEFLEX) 500 MG capsule Take 1 capsule (500 mg total) by mouth 3 (three) times daily for 7 days. 21 capsule Rondel Oh, MD      PDMP not reviewed this encounter.   Rondel Oh, MD 04/08/22 1052

## 2022-04-08 NOTE — Discharge Instructions (Addendum)
You were seen today for a lump/lymph node under the chin.  I have sent out a 7 day course of keflex to see if helps this resolve.  I also recommend motrin/ibuprofen 600mg  to help with pain/discomfort.  If this does not improve then please follow up with your primary care provider as you may need further testing that we cannot perform here.

## 2022-04-08 NOTE — ED Triage Notes (Signed)
A knot under the right side of the chin, onset yesterday. States it hurts and keeps her from laying on the side at night.  No sore throat, nasal congestion, or cough.

## 2023-11-16 ENCOUNTER — Ambulatory Visit: Admitting: Podiatry

## 2023-11-17 ENCOUNTER — Other Ambulatory Visit: Payer: Self-pay | Admitting: Family

## 2023-11-17 DIAGNOSIS — E049 Nontoxic goiter, unspecified: Secondary | ICD-10-CM

## 2023-11-21 ENCOUNTER — Other Ambulatory Visit: Payer: Self-pay

## 2023-11-21 ENCOUNTER — Ambulatory Visit (HOSPITAL_COMMUNITY)
Admission: EM | Admit: 2023-11-21 | Discharge: 2023-11-21 | Disposition: A | Discharge status: Home / Self Care | Attending: Family Medicine | Admitting: Family Medicine

## 2023-11-21 ENCOUNTER — Encounter (HOSPITAL_COMMUNITY): Payer: Self-pay | Admitting: Emergency Medicine

## 2023-11-21 DIAGNOSIS — M79651 Pain in right thigh: Secondary | ICD-10-CM

## 2023-11-21 DIAGNOSIS — T63461A Toxic effect of venom of wasps, accidental (unintentional), initial encounter: Secondary | ICD-10-CM | POA: Diagnosis not present

## 2023-11-21 MED ORDER — CLOBETASOL PROPIONATE 0.05 % EX CREA: 1.0000 | TOPICAL_CREAM | Freq: Two times a day (BID) | CUTANEOUS | 0 refills | Status: AC

## 2023-11-21 NOTE — Discharge Instructions (Signed)
 The redness you see around the wasp sting is called a large localized reaction and may last for up to one week. Return here if this area enlarges rapidly over the next 24-48 hours or if you develop a fever. Use the steroid cream prescribed twice daily for up to one week.

## 2023-11-21 NOTE — ED Triage Notes (Signed)
 Patient reports a "black wasp" stung right anterior thigh.  Area is hot, warm to touch, and is itching

## 2023-11-22 ENCOUNTER — Ambulatory Visit: Admitting: Podiatry

## 2023-11-22 ENCOUNTER — Ambulatory Visit
Admission: RE | Admit: 2023-11-22 | Discharge: 2023-11-22 | Disposition: A | Discharge status: Home / Self Care | Source: Ambulatory Visit | Attending: Family | Admitting: Family

## 2023-11-22 ENCOUNTER — Encounter: Payer: Self-pay | Admitting: Family

## 2023-11-22 DIAGNOSIS — E049 Nontoxic goiter, unspecified: Secondary | ICD-10-CM

## 2023-11-22 NOTE — ED Provider Notes (Signed)
 [facility]   [id number] 11/21/23 Arrival Time: 1529  ASSESSMENT & PLAN:  1. Right thigh pain   2. Wasp sting, accidental or unintentional, initial encounter    No signs of skin infection.   Discharge Instructions      The redness you see around the wasp sting is called a large localized reaction and may last for up to one week. Return here if this area enlarges rapidly over the next 24-48 hours or if you develop a fever. Use the steroid cream prescribed twice daily for up to one week.  Trial of: Meds ordered this encounter  Medications   clobetasol cream (TEMOVATE) 0.05 %    Sig: Apply 1 Application topically 2 (two) times daily. For no longer than one week.    Dispense:  30 g    Refill:  0   May f/u as needed. Reviewed expectations re: course of current medical issues. Questions answered. Outlined signs and symptoms indicating need for more acute intervention. Understanding verbalized. After Visit Summary given.   SUBJECTIVE: History from: Patient. Sheneika Walstad is a 43 y.o. female. Patient reports a black wasp stung right anterior thigh; 2 d ago; area is hot, warm to touch, and is itching. Denies: fever. Normal PO intake without n/v/d.  OBJECTIVE:  Vitals:   11/21/23 1645  BP: 136/79  Pulse: 75  Resp: 18  Temp: 98 F (36.7 C)  TempSrc: Oral  SpO2: 98%    General appearance: alert; no distress Eyes: PERRLA; EOMI; conjunctiva normal HENT: [place]; AT Neck: supple  Lungs: speaks full sentences without difficulty; unlabored Extremities: no edema Skin: warm and dry; approx 3x3 round area of erythema over anterior L thigh; normal skin temp compared to surrounding skin; no sign of retained stinger Neurologic: normal gait Psychological: alert and cooperative; normal mood and affect  Labs: Results for orders placed or performed in visit on 03/23/20  Urinalysis Dipstick   Collection Time: 03/23/20 11:04 AM  Result Value Ref Range   Color, UA      Clarity, UA     Glucose, UA Negative Negative   Bilirubin, UA neg    Ketones, UA neg    Spec Grav, UA <=1.005 (A) 1.010 - 1.025   Blood, UA neg    pH, UA 6.0 5.0 - 8.0   Protein, UA Negative Negative   Urobilinogen, UA 0.2 0.2 or 1.0 E.U./dL   Nitrite, UA neg    Leukocytes, UA Small (1+) (A) Negative   Appearance     Odor    Urine Culture   Collection Time: 03/23/20 11:26 AM   Specimen: Urine   UR  Result Value Ref Range   Urine Culture, Routine Final report (A)    Organism ID, Bacteria Klebsiella pneumoniae (A)    ORGANISM ID, BACTERIA Comment    Antimicrobial Susceptibility Comment    Labs Reviewed - No data to display  Imaging: No results found.  Allergies  Allergen Reactions   Zantac  [Ranitidine  Hcl] Swelling and Rash    Past Medical History:  Diagnosis Date   Acid reflux 07/2019   Anemia 03/2018   Anemia 02/2019   Anxiety    Hyperlipidemia    Hypertension    Iron deficiency anemia 03/2018   Trichomoniasis    UTI (urinary tract infection) during pregnancy    Vitamin D  deficiency 02/2019   Social History   Socioeconomic History   Marital status: Single    Spouse name: Not on file   Number of children: 3  Years of education: Not on file   Highest education level: High school graduate  Occupational History   Not on file  Tobacco Use   Smoking status: Former    Current packs/day: 0.00    Average packs/day: 0.1 packs/day for 13.0 years (1.3 ttl pk-yrs)    Types: Cigarettes    Start date: 05/27/2004    Quit date: 05/27/2017    Years since quitting: 6.4   Smokeless tobacco: Never  Vaping Use   Vaping status: Never Used  Substance and Sexual Activity   Alcohol use: Not Currently    Comment: socially   Drug use: No   Sexual activity: Yes    Birth control/protection: None  Other Topics Concern   Not on file  Social History Narrative   Disabled - (since childhood).   Social Drivers of Corporate investment banker Strain: Not on File (11/11/2021)    Received from [employer], [employer]    Financial Resource Strain: 0  Food Insecurity: At Risk (11/17/2023)   Received from [employer]    Food: 2  Transportation Needs: At Risk (11/17/2023)   Received from [employer] Needs    Transportation: 2  Physical Activity: Not on File (11/11/2021)   Received from [place], [place]   Physical Activity    Physical Activity: 0  Stress: Not on File (11/11/2021)   Received from [employer], [place]   Stress    Stress: 0  Social Connections: Not on File (02/25/2023)   Received from [facility]   Social Connections    Connectedness: 0  Intimate Partner Violence: Not on file   Family History  Problem Relation Age of Onset   Kidney disease Maternal Grandmother    Heart failure Maternal Grandmother    Hypertension Mother    Asthma Sister    Coronary artery disease Father        CABG - in 53s.   Anesthesia problems Neg Hx    Other Neg Hx    Past Surgical History:  Procedure Laterality Date   CARDIOPULMONARY EXERCISE TEST  07/2017    No regional WMA. No valvular lesions. cardiopulmonary limitation seen. Peak VO2 normal for workload achieved on submaximal test. Dyspnea most likely related to deconditioning & limitations due to weight. Normal Study from a Heart & Lung standpoint.   LAPAROSCOPIC TUBAL LIGATION Bilateral 02/06/2013   Procedure: LAPAROSCOPIC TUBAL LIGATION;  Surgeon: Heide Livings, MD;  Location: [facility];  Service: Gynecology;  Laterality: Bilateral;   TRANSTHORACIC ECHOCARDIOGRAM  07/2017   EF 60-65%.  No regional WMA. No valvular lesions.   WISDOM TOOTH EXTRACTION       [name] Albright, MD 11/22/23 [phone]

## 2023-12-07 ENCOUNTER — Ambulatory Visit: Admitting: Podiatry

## 2024-01-17 ENCOUNTER — Ambulatory Visit (INDEPENDENT_AMBULATORY_CARE_PROVIDER_SITE_OTHER)

## 2024-01-17 ENCOUNTER — Encounter: Payer: Self-pay | Admitting: Podiatry

## 2024-01-17 ENCOUNTER — Ambulatory Visit (INDEPENDENT_AMBULATORY_CARE_PROVIDER_SITE_OTHER): Admitting: Podiatry

## 2024-01-17 DIAGNOSIS — M25572 Pain in left ankle and joints of left foot: Secondary | ICD-10-CM

## 2024-01-17 DIAGNOSIS — M79672 Pain in left foot: Secondary | ICD-10-CM | POA: Diagnosis not present

## 2024-01-17 DIAGNOSIS — M65972 Unspecified synovitis and tenosynovitis, left ankle and foot: Secondary | ICD-10-CM

## 2024-01-17 DIAGNOSIS — M7752 Other enthesopathy of left foot: Secondary | ICD-10-CM | POA: Diagnosis not present

## 2024-01-17 MED ORDER — TRIAMCINOLONE ACETONIDE 10 MG/ML IJ SUSP
10.0000 mg | Freq: Once | INTRAMUSCULAR | Status: AC
  Administered 2024-01-17: 10 mg

## 2024-01-17 NOTE — Progress Notes (Addendum)
 Patient presents complaining of pain in the lateral aspect of the left foot.  Indicates at the base of the fifth metatarsal left.  Tender Gaulding injury.  Has not noticed any ecchymosis or redness.  Pain with walking and standing also.  Has not had pain there previously.   Physical exam:  General appearance: Pleasant, and in no acute distress. AOx3.  Vascular: Pedal pulses: DP 2/4 bilaterally, PT 2/4 bilaterally. Mild edema lower legs bilaterally. Capillary fill time immediate b/l.SABRA  Neurological: Light touch intact feet bilaterally.  Normal Achilles reflex bilaterally.  No clonus or spasticity noted.   Dermatologic:   Skin normal temperature bilaterally.  Skin normal color, tone, and texture bilaterally.   Musculoskeletal: Tenderness at the peroneus brevis insertion along the distal 3 to 4 cm of the tendon left.  Some tenderness over the fifth tarsometatarsal joint left.  Normal muscle strength lower extremity.  Radiographs: 3 views foot left:   Diagnosis: 1.  Foot pain left 2.  Tenosynovitis peroneus brevis left 3 3.  Arthralgia TMT 5 left  Plan: -New patient office visit level 3 for evaluation management.  Modifier 25. -Discussed with her pain along the tendon and etiology and treatment.  Also has some pain at the fifth TMT left.  Will try an injection today and see how she responds.  Wear good supportive shoes bilaterally -RICE -injected 3cc 2:1 mixture 0.5 cc Marcaine :Kenolog 10mg /31ml at peroneus brevis insertion in the left.     Return 2 weeks follow-up injection left

## 2024-02-01 ENCOUNTER — Ambulatory Visit (INDEPENDENT_AMBULATORY_CARE_PROVIDER_SITE_OTHER): Admitting: Podiatry

## 2024-02-01 ENCOUNTER — Encounter: Payer: Self-pay | Admitting: Podiatry

## 2024-02-01 DIAGNOSIS — M7672 Peroneal tendinitis, left leg: Secondary | ICD-10-CM | POA: Diagnosis not present

## 2024-02-01 MED ORDER — TRIAMCINOLONE ACETONIDE 10 MG/ML IJ SUSP
10.0000 mg | Freq: Once | INTRAMUSCULAR | Status: AC
  Administered 2024-02-01: 10 mg

## 2024-02-01 NOTE — Progress Notes (Signed)
 Patient presents today with complaint of tenderness along the peroneus brevis tendon at the insertion at the base of the fifth metatarsal left.  Did well with first injection but over the past couple weeks pain has returned again.  Does not recall any inciting incident.  Physical exam:  General appearance: Pleasant, and in no acute distress. AOx3.  Vascular: Pedal pulses: DP 2/4 bilaterally, PT 2/4 bilaterally. No edema lower legs bilaterally. Capillary fill time immediate.  Neurological: Light touch intact feet bilaterally.  Normal Achilles reflex bilaterally.  No clonus or spasticity noted.   Dermatologic:   Skin normal temperature bilaterally.  Skin normal color, tone, and texture bilaterally.   Musculoskeletal: Tenderness and insertion of peroneus brevis and proximally 2 to 3 cm left.  Normal muscle strength and eversion.    Diagnosis: 1 peroneus brevis tendinitis left  Plan: -Initially got good improvement with the injection but pain is returned in the back past week.  Will try second injection.  Again recommended icing 20 minutes an hour 3-4 times a day. -injected 3cc 2:1 mixture 0.5 cc Marcaine :Kenolog 10mg /16ml at peroneus brevis along distal 2 to 3 cm of the tendon.     Return 2 weeks follow-up injection peroneus brevis left

## 2024-03-19 ENCOUNTER — Ambulatory Visit: Admitting: Podiatry

## 2024-06-18 ENCOUNTER — Ambulatory Visit: Admitting: Podiatry
